# Patient Record
Sex: Female | Born: 1955 | Race: Black or African American | Hispanic: No | Marital: Married | Smoking: Former smoker
Health system: Southern US, Community
[De-identification: ages and names within clinical notes are randomized; demographics above are authoritative.]

## PROBLEM LIST (undated history)

## (undated) DIAGNOSIS — I5030 Unspecified diastolic (congestive) heart failure: Secondary | ICD-10-CM

## (undated) DIAGNOSIS — K859 Acute pancreatitis without necrosis or infection, unspecified: Secondary | ICD-10-CM

## (undated) DIAGNOSIS — Z86718 Personal history of other venous thrombosis and embolism: Secondary | ICD-10-CM

## (undated) DIAGNOSIS — I10 Essential (primary) hypertension: Secondary | ICD-10-CM

## (undated) DIAGNOSIS — Z531 Procedure and treatment not carried out because of patient's decision for reasons of belief and group pressure: Secondary | ICD-10-CM

## (undated) DIAGNOSIS — D649 Anemia, unspecified: Secondary | ICD-10-CM

## (undated) DIAGNOSIS — E785 Hyperlipidemia, unspecified: Secondary | ICD-10-CM

## (undated) DIAGNOSIS — J189 Pneumonia, unspecified organism: Secondary | ICD-10-CM

## (undated) DIAGNOSIS — G589 Mononeuropathy, unspecified: Secondary | ICD-10-CM

## (undated) DIAGNOSIS — M797 Fibromyalgia: Secondary | ICD-10-CM

## (undated) DIAGNOSIS — G629 Polyneuropathy, unspecified: Secondary | ICD-10-CM

## (undated) DIAGNOSIS — F32A Depression, unspecified: Secondary | ICD-10-CM

## (undated) DIAGNOSIS — N289 Disorder of kidney and ureter, unspecified: Secondary | ICD-10-CM

## (undated) DIAGNOSIS — K5900 Constipation, unspecified: Secondary | ICD-10-CM

## (undated) DIAGNOSIS — I890 Lymphedema, not elsewhere classified: Secondary | ICD-10-CM

## (undated) DIAGNOSIS — M1990 Unspecified osteoarthritis, unspecified site: Secondary | ICD-10-CM

## (undated) DIAGNOSIS — IMO0001 Reserved for inherently not codable concepts without codable children: Secondary | ICD-10-CM

## (undated) DIAGNOSIS — I38 Endocarditis, valve unspecified: Secondary | ICD-10-CM

## (undated) DIAGNOSIS — T8859XA Other complications of anesthesia, initial encounter: Secondary | ICD-10-CM

## (undated) DIAGNOSIS — E119 Type 2 diabetes mellitus without complications: Secondary | ICD-10-CM

## (undated) DIAGNOSIS — F419 Anxiety disorder, unspecified: Secondary | ICD-10-CM

## (undated) DIAGNOSIS — G4730 Sleep apnea, unspecified: Secondary | ICD-10-CM

## (undated) DIAGNOSIS — I509 Heart failure, unspecified: Secondary | ICD-10-CM

## (undated) DIAGNOSIS — E559 Vitamin D deficiency, unspecified: Secondary | ICD-10-CM

## (undated) DIAGNOSIS — T4145XA Adverse effect of unspecified anesthetic, initial encounter: Secondary | ICD-10-CM

## (undated) DIAGNOSIS — F329 Major depressive disorder, single episode, unspecified: Secondary | ICD-10-CM

## (undated) DIAGNOSIS — T7840XA Allergy, unspecified, initial encounter: Secondary | ICD-10-CM

## (undated) DIAGNOSIS — N189 Chronic kidney disease, unspecified: Secondary | ICD-10-CM

## (undated) HISTORY — DX: Polyneuropathy, unspecified: G62.9

## (undated) HISTORY — DX: Mononeuropathy, unspecified: G58.9

## (undated) HISTORY — DX: Chronic kidney disease, unspecified: N18.9

## (undated) HISTORY — DX: Anemia, unspecified: D64.9

## (undated) HISTORY — DX: Acute pancreatitis without necrosis or infection, unspecified: K85.90

## (undated) HISTORY — PX: PATELLAR TENDON REPAIR: SHX737

## (undated) HISTORY — DX: Endocarditis, valve unspecified: I38

## (undated) HISTORY — DX: Allergy, unspecified, initial encounter: T78.40XA

## (undated) HISTORY — DX: Lymphedema, not elsewhere classified: I89.0

## (undated) HISTORY — DX: Depression, unspecified: F32.A

## (undated) HISTORY — DX: Hyperlipidemia, unspecified: E78.5

## (undated) HISTORY — DX: Unspecified osteoarthritis, unspecified site: M19.90

## (undated) HISTORY — PX: COLONOSCOPY: SHX174

## (undated) HISTORY — DX: Vitamin D deficiency, unspecified: E55.9

## (undated) HISTORY — DX: Constipation, unspecified: K59.00

## (undated) HISTORY — DX: Sleep apnea, unspecified: G47.30

## (undated) HISTORY — DX: Heart failure, unspecified: I50.9

## (undated) HISTORY — DX: Disorder of kidney and ureter, unspecified: N28.9

## (undated) HISTORY — PX: JOINT REPLACEMENT: SHX530

## (undated) HISTORY — DX: Unspecified diastolic (congestive) heart failure: I50.30

## (undated) HISTORY — DX: Personal history of other venous thrombosis and embolism: Z86.718

## (undated) HISTORY — DX: Major depressive disorder, single episode, unspecified: F32.9

## (undated) HISTORY — PX: OTHER SURGICAL HISTORY: SHX169

## (undated) HISTORY — PX: EYE SURGERY: SHX253

---

## 1978-11-13 HISTORY — PX: TUBAL LIGATION: SHX77

## 2002-09-04 ENCOUNTER — Emergency Department (HOSPITAL_COMMUNITY): Admission: EM | Admit: 2002-09-04 | Discharge: 2002-09-04 | Payer: Self-pay

## 2002-09-19 ENCOUNTER — Emergency Department (HOSPITAL_COMMUNITY): Admission: EM | Admit: 2002-09-19 | Discharge: 2002-09-19 | Payer: Self-pay | Admitting: Emergency Medicine

## 2002-10-31 ENCOUNTER — Encounter: Payer: Self-pay | Admitting: Family Medicine

## 2002-10-31 ENCOUNTER — Ambulatory Visit (HOSPITAL_COMMUNITY): Admission: RE | Admit: 2002-10-31 | Discharge: 2002-10-31 | Payer: Self-pay | Admitting: Family Medicine

## 2002-11-01 ENCOUNTER — Encounter: Payer: Self-pay | Admitting: Orthopedic Surgery

## 2002-11-01 ENCOUNTER — Ambulatory Visit (HOSPITAL_COMMUNITY): Admission: RE | Admit: 2002-11-01 | Discharge: 2002-11-01 | Payer: Self-pay | Admitting: Orthopedic Surgery

## 2002-11-04 ENCOUNTER — Ambulatory Visit (HOSPITAL_COMMUNITY): Admission: RE | Admit: 2002-11-04 | Discharge: 2002-11-04 | Payer: Self-pay | Admitting: Family Medicine

## 2003-05-28 ENCOUNTER — Encounter: Admission: RE | Admit: 2003-05-28 | Discharge: 2003-08-26 | Payer: Self-pay | Admitting: Family Medicine

## 2003-08-07 ENCOUNTER — Emergency Department (HOSPITAL_COMMUNITY): Admission: EM | Admit: 2003-08-07 | Discharge: 2003-08-07 | Payer: Self-pay | Admitting: Emergency Medicine

## 2004-01-27 ENCOUNTER — Ambulatory Visit (HOSPITAL_COMMUNITY): Admission: RE | Admit: 2004-01-27 | Discharge: 2004-01-27 | Payer: Self-pay | Admitting: Obstetrics and Gynecology

## 2004-01-28 ENCOUNTER — Emergency Department (HOSPITAL_COMMUNITY): Admission: AD | Admit: 2004-01-28 | Discharge: 2004-01-28 | Payer: Self-pay | Admitting: Family Medicine

## 2004-04-21 ENCOUNTER — Emergency Department (HOSPITAL_COMMUNITY): Admission: EM | Admit: 2004-04-21 | Discharge: 2004-04-21 | Payer: Self-pay | Admitting: Family Medicine

## 2004-09-12 ENCOUNTER — Emergency Department (HOSPITAL_COMMUNITY): Admission: EM | Admit: 2004-09-12 | Discharge: 2004-09-12 | Payer: Self-pay | Admitting: Emergency Medicine

## 2005-02-23 ENCOUNTER — Inpatient Hospital Stay (HOSPITAL_COMMUNITY): Admission: EM | Admit: 2005-02-23 | Discharge: 2005-02-25 | Payer: Self-pay | Admitting: Emergency Medicine

## 2005-02-24 ENCOUNTER — Encounter: Payer: Self-pay | Admitting: Cardiology

## 2005-02-24 ENCOUNTER — Ambulatory Visit: Payer: Self-pay | Admitting: Cardiology

## 2005-09-19 ENCOUNTER — Ambulatory Visit (HOSPITAL_COMMUNITY): Admission: RE | Admit: 2005-09-19 | Discharge: 2005-09-19 | Payer: Self-pay | Admitting: Internal Medicine

## 2005-10-19 ENCOUNTER — Emergency Department (HOSPITAL_COMMUNITY): Admission: EM | Admit: 2005-10-19 | Discharge: 2005-10-19 | Payer: Self-pay | Admitting: Family Medicine

## 2005-10-21 ENCOUNTER — Emergency Department (HOSPITAL_COMMUNITY): Admission: EM | Admit: 2005-10-21 | Discharge: 2005-10-21 | Payer: Self-pay | Admitting: Family Medicine

## 2005-11-06 ENCOUNTER — Emergency Department (HOSPITAL_COMMUNITY): Admission: EM | Admit: 2005-11-06 | Discharge: 2005-11-07 | Payer: Self-pay | Admitting: Emergency Medicine

## 2005-11-07 ENCOUNTER — Emergency Department (HOSPITAL_COMMUNITY): Admission: EM | Admit: 2005-11-07 | Discharge: 2005-11-07 | Payer: Self-pay | Admitting: Family Medicine

## 2005-11-15 ENCOUNTER — Emergency Department (HOSPITAL_COMMUNITY): Admission: EM | Admit: 2005-11-15 | Discharge: 2005-11-15 | Payer: Self-pay | Admitting: Family Medicine

## 2005-11-28 ENCOUNTER — Encounter: Admission: RE | Admit: 2005-11-28 | Discharge: 2005-11-28 | Payer: Self-pay | Admitting: Internal Medicine

## 2005-12-02 ENCOUNTER — Encounter: Admission: RE | Admit: 2005-12-02 | Discharge: 2005-12-02 | Payer: Self-pay | Admitting: Internal Medicine

## 2006-07-29 ENCOUNTER — Emergency Department (HOSPITAL_COMMUNITY): Admission: EM | Admit: 2006-07-29 | Discharge: 2006-07-29 | Payer: Self-pay | Admitting: Family Medicine

## 2007-04-15 ENCOUNTER — Ambulatory Visit (HOSPITAL_COMMUNITY): Admission: RE | Admit: 2007-04-15 | Discharge: 2007-04-15 | Payer: Self-pay | Admitting: Gynecology

## 2007-09-04 ENCOUNTER — Emergency Department (HOSPITAL_COMMUNITY): Admission: EM | Admit: 2007-09-04 | Discharge: 2007-09-04 | Payer: Self-pay | Admitting: Emergency Medicine

## 2007-10-03 ENCOUNTER — Ambulatory Visit (HOSPITAL_COMMUNITY): Admission: RE | Admit: 2007-10-03 | Discharge: 2007-10-03 | Payer: Self-pay | Admitting: Cardiology

## 2007-10-03 ENCOUNTER — Encounter (INDEPENDENT_AMBULATORY_CARE_PROVIDER_SITE_OTHER): Payer: Self-pay | Admitting: Cardiology

## 2007-11-15 ENCOUNTER — Other Ambulatory Visit: Admission: RE | Admit: 2007-11-15 | Discharge: 2007-11-15 | Payer: Self-pay | Admitting: Family Medicine

## 2008-01-22 ENCOUNTER — Encounter (INDEPENDENT_AMBULATORY_CARE_PROVIDER_SITE_OTHER): Payer: Self-pay | Admitting: Gastroenterology

## 2008-01-22 ENCOUNTER — Ambulatory Visit (HOSPITAL_COMMUNITY): Admission: RE | Admit: 2008-01-22 | Discharge: 2008-01-22 | Payer: Self-pay | Admitting: Gastroenterology

## 2008-03-11 ENCOUNTER — Emergency Department (HOSPITAL_COMMUNITY): Admission: EM | Admit: 2008-03-11 | Discharge: 2008-03-11 | Payer: Self-pay | Admitting: Emergency Medicine

## 2008-06-19 ENCOUNTER — Ambulatory Visit (HOSPITAL_COMMUNITY): Admission: RE | Admit: 2008-06-19 | Discharge: 2008-06-19 | Payer: Self-pay | Admitting: Family Medicine

## 2008-09-20 ENCOUNTER — Emergency Department (HOSPITAL_COMMUNITY): Admission: EM | Admit: 2008-09-20 | Discharge: 2008-09-20 | Payer: Self-pay | Admitting: Emergency Medicine

## 2009-07-02 ENCOUNTER — Ambulatory Visit (HOSPITAL_COMMUNITY): Admission: RE | Admit: 2009-07-02 | Discharge: 2009-07-02 | Payer: Self-pay | Admitting: Internal Medicine

## 2010-03-18 ENCOUNTER — Emergency Department (HOSPITAL_COMMUNITY): Admission: EM | Admit: 2010-03-18 | Discharge: 2010-03-18 | Payer: Self-pay | Admitting: Emergency Medicine

## 2010-08-16 ENCOUNTER — Ambulatory Visit (HOSPITAL_COMMUNITY): Admission: RE | Admit: 2010-08-16 | Discharge: 2010-08-16 | Payer: Self-pay | Admitting: Internal Medicine

## 2010-12-04 ENCOUNTER — Encounter: Payer: Self-pay | Admitting: Gynecology

## 2010-12-04 ENCOUNTER — Encounter: Payer: Self-pay | Admitting: Family Medicine

## 2011-03-28 NOTE — Op Note (Signed)
Cynthia [name], Cynthia [name]                  ACCOUNT NO.:  [id number]   MEDICAL RECORD NO.:  [id number]          PATIENT TYPE:  AMB   LOCATION:  ENDO                         FACILITY:  [facility]   PHYSICIAN:  Shirley Friar, MDDATE OF BIRTH:  1955-12-25   DATE OF PROCEDURE:  01/22/2008  DATE OF DISCHARGE:                               OPERATIVE REPORT   PROCEDURE:  Colonoscopy.   INDICATIONS:  Screening.   MEDICATIONS:  Fentanyl 100 mcg IV, Versed 7 mg IV, Phenergan 12.5 mg IV.   FINDINGS:  Rectal exam was normal.  A pediatric Pentax colonoscope was  inserted into a well prepped colon and advanced to the cecum where the  ileocecal valve and appendiceal orifice were identified.  On careful  withdrawal of the colonoscope, two sessile polyps were seen in the  descending colon approximately 3-4 mm in size.  These polyps were  removed with hot biopsy.  On further withdrawal of the colonoscope, a 5-  mm sessile polyp was seen in the sigmoid colon.  This polyp was removed  with snare cautery.  In the rectum there were two diminutive polyps seen  and they were both removed with hot biopsy.  Retroflexion revealed small  internal hemorrhoids.  No other mucosal abnormalities were seen.   ASSESSMENT:  1. Colon polyps x 5, status post removal with snare cautery and hot      biopsy as stated above.  2. Internal hemorrhoids.   PLAN:  1. Follow-up on path.  2. Hold aspirin products x 2 weeks.      Shirley Friar, MD  Electronically Signed     VCS/MEDQ  D:  01/22/2008  T:  01/23/2008  Job:  [id number]   cc:   Lavonda Jumbo, M.D.  Fax: [phone]
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
## 2011-03-31 NOTE — Discharge Summary (Signed)
[name], [name]                  ACCOUNT NO.:  [id number]   MEDICAL RECORD NO.:  [id number]          PATIENT TYPE:  INP   LOCATION:  3731                         FACILITY:  [facility]   PHYSICIAN:  Wilber Bihari. Caryn Section, M.D.   DATE OF BIRTH:  [birth year]   DATE OF ADMISSION:  02/23/2005  DATE OF DISCHARGE:  02/25/2005                                 DISCHARGE SUMMARY   DISCHARGE DIAGNOSES:  1.  Hypotension, transient.  2.  Malignant hypertension.  3.  Diabetes mellitus, type 2.  4.  Anemia.  5.  Fibromyalgia.  6.  Obesity.   DISCHARGE MEDICATIONS:  1.  Lisinopril 40 mg p.o. b.i.d.  2.  Elavil 75 mg p.o. q.h.s.  3.  Norvasc 10 mg p.o. q.h.s.  4.  Tenormin 25 mg p.o. daily.  The patient is to start if blood pressure is      consistently greater than or equal to 140/90 for three measurements.  5.  Glucophage 1 g twice daily.  6.  Glucotrol 10 mg daily.  7.  Xanax.  8.  Flexeril as previously prescribed.   FOLLOWUP APPOINTMENTS:  Follow up with Dr. Kathrene Bongo in May as previously  scheduled.  Please call to make an appointment with Dr. Dorothe Pea as next  available.   SPECIAL INSTRUCTIONS:  Stop all other blood pressure medications, exercise,  low-salt diet and avoid stress.   The patient will [facility] diabetic and hypertensive nutrition teaching.   BRIEF ADMISSION HISTORY AND PHYSICAL:  Please see the dictated H&P.  Ms.  Cynthia [name] is a 55 year old African American female with difficult to control  blood pressure.  She had been on four blood pressure medications, per the  patient.  On February 23, 2005, she went to an appointment for high blood  pressure and her blood pressure was measured to have a postural drop to  80/40 on standing.  The patient felt weak and lightheaded.   HOSPITAL COURSE BY PROBLEM LIST:  #1 - HYPOTENSION:  The patient was held on  all of her blood pressure medications and within 24 hours her blood  pressures began to normalize.  First in the hospital stay, she had no  problems with hypotension and the etiology was thought to be solely from  medication.   #2 - MALIGNANT HYPERTENSION:  The patient has been battling this issue for  approximately three years.  She has been on multiple medications and states  that she is taking her medications as instructed.  It was difficult to  verify this given the fact that she would not bring in her medications to  her appointments or into the hospital.  Because of the variable blood  pressures, a workup for secondary hypertension was initiated.  The patient  had completed a 24-hour urine collection on the day of discharge, but the  result was not back and this should be checked by Dr. Kathrene Bongo at  [facility].  Additionally, she had a serum renin and serum  aldosterone checked that were both pending at the  time of discharge.  She  did have an echocardiogram that showed a 60% ejection fraction, no valvular  abnormalities or hypokinesis.  As her blood pressure gradually increased  following the holding of blood pressure medications on admission, blood  pressure medications were gradually started back.  On the day of discharge,  she was recommended to be on lisinopril 40 mg twice a day with Norvasc 10 mg  once a day.  The next blood pressure medicine Dr. Caryn Section had indicated to be  started would be Tenormin 25 mg daily, which the patient will start if she  has blood pressure measurements greater than 140/90.  She was also  instructed to evaluate her blood pressure monitor at home to make sure this  is working correctly and to use automatic blood pressure monitor as opposed  to manual given the fact that it is unclear as to whether she is doing this  correctly.  She will call [facility] if her blood pressure  begins to elevate above her goal.   #3 - DIABETES:  The patient had poor control during her hospital stay on   glipizide.  She will restart both glipizide and Glucophage as an outpatient.  Her hemoglobin A1C in the hospital was noted to be 6.7.   #4 - ANEMIA:  The hemoglobin was stable throughout the hospital stay and the  patient was asymptomatic.  Her total iron was good and she did not need iron  repletion.  She does have this anemia secondary to menorrhagia, which is not  currently occurring.      AB/MEDQ  D:  02/25/2005  T:  02/25/2005  Job:  [id number]   cc:   Cecille Aver, M.D.  [address]  [place]  [place] [address]  Fax: [phone]   Jethro Bastos, M.D.  [address]  [place]  [place] [address]  Fax: [phone]

## 2011-05-14 HISTORY — PX: NM MYOVIEW LTD: HXRAD82

## 2011-05-14 HISTORY — PX: OTHER SURGICAL HISTORY: SHX169

## 2011-05-16 ENCOUNTER — Ambulatory Visit: Payer: Medicaid Other | Attending: Anesthesiology

## 2011-05-16 DIAGNOSIS — IMO0001 Reserved for inherently not codable concepts without codable children: Secondary | ICD-10-CM | POA: Insufficient documentation

## 2011-05-16 DIAGNOSIS — M2550 Pain in unspecified joint: Secondary | ICD-10-CM | POA: Insufficient documentation

## 2011-05-16 DIAGNOSIS — M2560 Stiffness of unspecified joint, not elsewhere classified: Secondary | ICD-10-CM | POA: Insufficient documentation

## 2011-05-16 DIAGNOSIS — R5381 Other malaise: Secondary | ICD-10-CM | POA: Insufficient documentation

## 2011-06-01 ENCOUNTER — Ambulatory Visit: Payer: Medicaid Other

## 2011-06-19 ENCOUNTER — Ambulatory Visit: Payer: Medicaid Other | Attending: Anesthesiology | Admitting: Physical Therapy

## 2011-06-19 DIAGNOSIS — R5381 Other malaise: Secondary | ICD-10-CM | POA: Insufficient documentation

## 2011-06-19 DIAGNOSIS — M2550 Pain in unspecified joint: Secondary | ICD-10-CM | POA: Insufficient documentation

## 2011-06-19 DIAGNOSIS — IMO0001 Reserved for inherently not codable concepts without codable children: Secondary | ICD-10-CM | POA: Insufficient documentation

## 2011-06-19 DIAGNOSIS — M2560 Stiffness of unspecified joint, not elsewhere classified: Secondary | ICD-10-CM | POA: Insufficient documentation

## 2011-06-27 ENCOUNTER — Ambulatory Visit: Payer: Medicaid Other | Admitting: Physical Therapy

## 2011-07-10 ENCOUNTER — Inpatient Hospital Stay (HOSPITAL_COMMUNITY)
Admission: EM | Admit: 2011-07-10 | Discharge: 2011-07-16 | DRG: 440 | Disposition: A | Discharge status: Home / Self Care | Payer: Medicaid Other | Attending: Internal Medicine | Admitting: Internal Medicine

## 2011-07-10 ENCOUNTER — Inpatient Hospital Stay (INDEPENDENT_AMBULATORY_CARE_PROVIDER_SITE_OTHER)
Admission: RE | Admit: 2011-07-10 | Discharge: 2011-07-10 | Disposition: A | Discharge status: Home / Self Care | Payer: Medicaid Other | Source: Ambulatory Visit | Attending: Emergency Medicine | Admitting: Emergency Medicine

## 2011-07-10 DIAGNOSIS — E781 Pure hyperglyceridemia: Secondary | ICD-10-CM | POA: Diagnosis present

## 2011-07-10 DIAGNOSIS — E669 Obesity, unspecified: Secondary | ICD-10-CM | POA: Diagnosis present

## 2011-07-10 DIAGNOSIS — I10 Essential (primary) hypertension: Secondary | ICD-10-CM | POA: Diagnosis present

## 2011-07-10 DIAGNOSIS — K859 Acute pancreatitis without necrosis or infection, unspecified: Principal | ICD-10-CM | POA: Diagnosis present

## 2011-07-10 DIAGNOSIS — D638 Anemia in other chronic diseases classified elsewhere: Secondary | ICD-10-CM | POA: Diagnosis present

## 2011-07-10 DIAGNOSIS — E785 Hyperlipidemia, unspecified: Secondary | ICD-10-CM | POA: Diagnosis present

## 2011-07-10 DIAGNOSIS — Z79899 Other long term (current) drug therapy: Secondary | ICD-10-CM

## 2011-07-10 DIAGNOSIS — I509 Heart failure, unspecified: Secondary | ICD-10-CM | POA: Diagnosis present

## 2011-07-10 DIAGNOSIS — K819 Cholecystitis, unspecified: Secondary | ICD-10-CM

## 2011-07-10 DIAGNOSIS — E876 Hypokalemia: Secondary | ICD-10-CM | POA: Diagnosis present

## 2011-07-10 DIAGNOSIS — D509 Iron deficiency anemia, unspecified: Secondary | ICD-10-CM | POA: Diagnosis present

## 2011-07-10 DIAGNOSIS — E860 Dehydration: Secondary | ICD-10-CM | POA: Diagnosis present

## 2011-07-10 DIAGNOSIS — IMO0001 Reserved for inherently not codable concepts without codable children: Secondary | ICD-10-CM | POA: Diagnosis present

## 2011-07-10 DIAGNOSIS — M129 Arthropathy, unspecified: Secondary | ICD-10-CM | POA: Diagnosis present

## 2011-07-10 LAB — COMPREHENSIVE METABOLIC PANEL
AST: 11 U/L (ref 0–37)
Albumin: 4 g/dL (ref 3.5–5.2)
Chloride: 97 mEq/L (ref 96–112)
Creatinine, Ser: 1.19 mg/dL — ABNORMAL HIGH (ref 0.50–1.10)
Total Bilirubin: 0.3 mg/dL (ref 0.3–1.2)
Total Protein: 8.1 g/dL (ref 6.0–8.3)

## 2011-07-10 LAB — CBC
MCHC: 33.2 g/dL (ref 30.0–36.0)
MCV: 81.7 fL (ref 78.0–100.0)
Platelets: 296 10*3/uL (ref 150–400)
RDW: 12.9 % (ref 11.5–15.5)
WBC: 6.4 10*3/uL (ref 4.0–10.5)

## 2011-07-10 LAB — POCT URINALYSIS DIP (DEVICE)
Leukocytes, UA: NEGATIVE
Nitrite: NEGATIVE
Protein, ur: 30 mg/dL — AB
pH: 5.5 (ref 5.0–8.0)

## 2011-07-10 LAB — DIFFERENTIAL
Basophils Absolute: 0 10*3/uL (ref 0.0–0.1)
Eosinophils Absolute: 0.1 10*3/uL (ref 0.0–0.7)
Eosinophils Relative: 2 % (ref 0–5)

## 2011-07-11 ENCOUNTER — Emergency Department (HOSPITAL_COMMUNITY): Payer: Medicaid Other

## 2011-07-11 LAB — GLUCOSE, CAPILLARY
Glucose-Capillary: 270 mg/dL — ABNORMAL HIGH (ref 70–99)
Glucose-Capillary: 245 mg/dL — ABNORMAL HIGH (ref 70–99)
Glucose-Capillary: 140 mg/dL — ABNORMAL HIGH (ref 70–99)

## 2011-07-11 LAB — CK TOTAL AND CKMB (NOT AT ARMC)
CK, MB: 1.7 ng/mL (ref 0.3–4.0)
CK, MB: 2.2 ng/mL (ref 0.3–4.0)
Relative Index: INVALID (ref 0.0–2.5)
Total CK: 79 U/L (ref 7–177)
Total CK: 113 U/L (ref 7–177)

## 2011-07-11 LAB — HEMOGLOBIN A1C
Hgb A1c MFr Bld: 10.8 % — ABNORMAL HIGH (ref <5.7)
Mean Plasma Glucose: 263 mg/dL — ABNORMAL HIGH (ref <117)

## 2011-07-11 LAB — LIPID PANEL: HDL: 43 mg/dL (ref >39)

## 2011-07-11 LAB — TROPONIN I
Troponin I: <0.3 ng/mL (ref <0.30)
Troponin I: <0.3 ng/mL (ref <0.30)
Troponin I: <0.3 ng/mL (ref <0.30)

## 2011-07-11 MED ORDER — IOHEXOL 300 MG/ML  SOLN
100.0000 mL | Freq: Once | INTRAMUSCULAR | Status: AC | PRN
  Administered 2011-07-11: 100 mL via INTRAVENOUS
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
## 2011-07-12 ENCOUNTER — Inpatient Hospital Stay (HOSPITAL_COMMUNITY): Payer: Medicaid Other

## 2011-07-12 LAB — COMPREHENSIVE METABOLIC PANEL
Albumin: 3.1 g/dL — ABNORMAL LOW (ref 3.5–5.2)
BUN: 10 mg/dL (ref 6–23)
Calcium: 8.8 mg/dL (ref 8.4–10.5)
Creatinine, Ser: 0.92 mg/dL (ref 0.50–1.10)
GFR calc Af Amer: >60 mL/min (ref >60)
Glucose, Bld: 180 mg/dL — ABNORMAL HIGH (ref 70–99)
Potassium: 3.7 mEq/L (ref 3.5–5.1)
Total Protein: 6.6 g/dL (ref 6.0–8.3)

## 2011-07-12 LAB — DIFFERENTIAL
Basophils Absolute: 0 10*3/uL (ref 0.0–0.1)
Eosinophils Relative: 2 % (ref 0–5)
Lymphocytes Relative: 31 % (ref 12–46)
Monocytes Absolute: 0.3 10*3/uL (ref 0.1–1.0)
Monocytes Relative: 7 % (ref 3–12)

## 2011-07-12 LAB — CBC
HCT: 31.9 % — ABNORMAL LOW (ref 36.0–46.0)
Hemoglobin: 10.4 g/dL — ABNORMAL LOW (ref 12.0–15.0)
MCH: 26.7 pg (ref 26.0–34.0)
MCHC: 32.6 g/dL (ref 30.0–36.0)
RDW: 12.9 % (ref 11.5–15.5)

## 2011-07-12 LAB — GLUCOSE, CAPILLARY
Glucose-Capillary: 249 mg/dL — ABNORMAL HIGH (ref 70–99)
Glucose-Capillary: 148 mg/dL — ABNORMAL HIGH (ref 70–99)

## 2011-07-12 LAB — MAGNESIUM: Magnesium: 1.4 mg/dL — ABNORMAL LOW (ref 1.5–2.5)

## 2011-07-13 LAB — BASIC METABOLIC PANEL
CO2: 28 mEq/L (ref 19–32)
Calcium: 8.4 mg/dL (ref 8.4–10.5)
Chloride: 102 mEq/L (ref 96–112)
Creatinine, Ser: 0.84 mg/dL (ref 0.50–1.10)
GFR calc Af Amer: >60 mL/min (ref >60)
Sodium: 136 mEq/L (ref 135–145)

## 2011-07-13 LAB — GLUCOSE, CAPILLARY
Glucose-Capillary: 125 mg/dL — ABNORMAL HIGH (ref 70–99)
Glucose-Capillary: 93 mg/dL (ref 70–99)

## 2011-07-13 LAB — LIPASE, BLOOD: Lipase: 27 U/L (ref 11–59)

## 2011-07-13 LAB — CBC
HCT: 29.5 % — ABNORMAL LOW (ref 36.0–46.0)
Hemoglobin: 9.8 g/dL — ABNORMAL LOW (ref 12.0–15.0)
MCHC: 33.2 g/dL (ref 30.0–36.0)
WBC: 3.6 10*3/uL — ABNORMAL LOW (ref 4.0–10.5)

## 2011-07-13 LAB — MAGNESIUM: Magnesium: 1.9 mg/dL (ref 1.5–2.5)

## 2011-07-14 LAB — CBC
HCT: 28.2 % — ABNORMAL LOW (ref 36.0–46.0)
Hemoglobin: 9.3 g/dL — ABNORMAL LOW (ref 12.0–15.0)
MCHC: 33 g/dL (ref 30.0–36.0)
RBC: 3.48 MIL/uL — ABNORMAL LOW (ref 3.87–5.11)

## 2011-07-14 LAB — BASIC METABOLIC PANEL
BUN: 6 mg/dL (ref 6–23)
CO2: 24 mEq/L (ref 19–32)
Chloride: 101 mEq/L (ref 96–112)
GFR calc non Af Amer: >60 mL/min (ref >60)
Glucose, Bld: 94 mg/dL (ref 70–99)
Potassium: 3.6 mEq/L (ref 3.5–5.1)

## 2011-07-14 LAB — GLUCOSE, CAPILLARY
Glucose-Capillary: 88 mg/dL (ref 70–99)
Glucose-Capillary: 129 mg/dL — ABNORMAL HIGH (ref 70–99)
Glucose-Capillary: 163 mg/dL — ABNORMAL HIGH (ref 70–99)

## 2011-07-14 LAB — IRON AND TIBC
Iron: 38 ug/dL — ABNORMAL LOW (ref 42–135)
TIBC: 250 ug/dL (ref 250–470)

## 2011-07-15 ENCOUNTER — Inpatient Hospital Stay (HOSPITAL_COMMUNITY): Payer: Medicaid Other

## 2011-07-15 LAB — URINE MICROSCOPIC-ADD ON

## 2011-07-15 LAB — BASIC METABOLIC PANEL
CO2: 25 mEq/L (ref 19–32)
Chloride: 105 mEq/L (ref 96–112)
GFR calc Af Amer: >60 mL/min (ref >60)
Sodium: 139 mEq/L (ref 135–145)

## 2011-07-15 LAB — URINALYSIS, ROUTINE W REFLEX MICROSCOPIC
Glucose, UA: NEGATIVE mg/dL
Hgb urine dipstick: NEGATIVE
Ketones, ur: 15 mg/dL — AB
Protein, ur: 30 mg/dL — AB
Urobilinogen, UA: 1 mg/dL (ref 0.0–1.0)

## 2011-07-15 LAB — MAGNESIUM: Magnesium: 1.9 mg/dL (ref 1.5–2.5)

## 2011-07-15 LAB — CBC
Platelets: 271 10*3/uL (ref 150–400)
RBC: 3.59 MIL/uL — ABNORMAL LOW (ref 3.87–5.11)
RDW: 13 % (ref 11.5–15.5)
WBC: 5 10*3/uL (ref 4.0–10.5)

## 2011-07-15 LAB — GLUCOSE, CAPILLARY
Glucose-Capillary: 141 mg/dL — ABNORMAL HIGH (ref 70–99)
Glucose-Capillary: 135 mg/dL — ABNORMAL HIGH (ref 70–99)

## 2011-07-16 ENCOUNTER — Inpatient Hospital Stay (HOSPITAL_COMMUNITY): Payer: Medicaid Other

## 2011-07-16 ENCOUNTER — Other Ambulatory Visit (HOSPITAL_COMMUNITY): Payer: Medicaid Other

## 2011-07-16 LAB — DIFFERENTIAL
Basophils Absolute: 0 10*3/uL (ref 0.0–0.1)
Basophils Relative: 1 % (ref 0–1)
Eosinophils Absolute: 0.1 10*3/uL (ref 0.0–0.7)
Neutrophils Relative %: 49 % (ref 43–77)

## 2011-07-16 LAB — COMPREHENSIVE METABOLIC PANEL
ALT: 37 U/L — ABNORMAL HIGH (ref 0–35)
AST: 30 U/L (ref 0–37)
CO2: 27 mEq/L (ref 19–32)
Calcium: 8.9 mg/dL (ref 8.4–10.5)
Chloride: 105 mEq/L (ref 96–112)
GFR calc non Af Amer: 56 mL/min — ABNORMAL LOW (ref >60)
Potassium: 3.5 mEq/L (ref 3.5–5.1)
Sodium: 140 mEq/L (ref 135–145)

## 2011-07-16 LAB — CBC
MCH: 27.3 pg (ref 26.0–34.0)
Platelets: 243 10*3/uL (ref 150–400)
RBC: 3.37 MIL/uL — ABNORMAL LOW (ref 3.87–5.11)
WBC: 3.4 10*3/uL — ABNORMAL LOW (ref 4.0–10.5)

## 2011-07-16 LAB — GLUCOSE, CAPILLARY

## 2011-07-17 LAB — URINE CULTURE
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
## 2011-08-23 LAB — COMPREHENSIVE METABOLIC PANEL
CO2: 31
Calcium: 9.5
Creatinine, Ser: 1.12
GFR calc non Af Amer: 51 — ABNORMAL LOW
Glucose, Bld: 194 — ABNORMAL HIGH

## 2011-08-23 LAB — CBC
HCT: 33.9 — ABNORMAL LOW
Hemoglobin: 11.4 — ABNORMAL LOW
MCHC: 33.7
MCV: 81.4
RBC: 4.17

## 2011-08-23 LAB — URINALYSIS, ROUTINE W REFLEX MICROSCOPIC
Bilirubin Urine: NEGATIVE
Hgb urine dipstick: NEGATIVE
Specific Gravity, Urine: 1.014
Urobilinogen, UA: 0.2
pH: 7

## 2011-08-23 LAB — DIFFERENTIAL
Basophils Absolute: 0
Eosinophils Absolute: 0.2
Lymphocytes Relative: 34
Lymphs Abs: 1.5
Neutrophils Relative %: 55

## 2011-08-23 LAB — POCT CARDIAC MARKERS
Myoglobin, poc: 84.3
Operator id: 4708
Troponin i, poc: <0.05

## 2011-08-31 ENCOUNTER — Ambulatory Visit: Payer: Medicaid Other | Admitting: *Deleted

## 2011-09-04 ENCOUNTER — Ambulatory Visit: Payer: Medicaid Other | Admitting: *Deleted

## 2011-09-05 ENCOUNTER — Ambulatory Visit: Payer: Medicaid Other | Admitting: *Deleted

## 2011-10-11 ENCOUNTER — Other Ambulatory Visit (HOSPITAL_COMMUNITY): Payer: Self-pay | Admitting: Internal Medicine

## 2011-10-11 DIAGNOSIS — Z1231 Encounter for screening mammogram for malignant neoplasm of breast: Secondary | ICD-10-CM

## 2011-10-13 ENCOUNTER — Ambulatory Visit (HOSPITAL_COMMUNITY)
Admission: RE | Admit: 2011-10-13 | Discharge: 2011-10-13 | Disposition: A | Discharge status: Home / Self Care | Payer: Medicaid Other | Source: Ambulatory Visit | Attending: Internal Medicine | Admitting: Internal Medicine

## 2011-10-13 DIAGNOSIS — Z1231 Encounter for screening mammogram for malignant neoplasm of breast: Secondary | ICD-10-CM | POA: Insufficient documentation

## 2013-04-21 ENCOUNTER — Other Ambulatory Visit (HOSPITAL_COMMUNITY): Payer: Self-pay | Admitting: Internal Medicine

## 2013-04-21 DIAGNOSIS — Z1231 Encounter for screening mammogram for malignant neoplasm of breast: Secondary | ICD-10-CM

## 2013-04-25 ENCOUNTER — Ambulatory Visit (HOSPITAL_COMMUNITY)
Admission: RE | Admit: 2013-04-25 | Discharge: 2013-04-25 | Disposition: A | Discharge status: Home / Self Care | Payer: 59 | Source: Ambulatory Visit | Attending: Internal Medicine | Admitting: Internal Medicine

## 2013-04-25 DIAGNOSIS — Z1231 Encounter for screening mammogram for malignant neoplasm of breast: Secondary | ICD-10-CM

## 2013-05-10 ENCOUNTER — Encounter (HOSPITAL_COMMUNITY): Payer: Self-pay | Admitting: Emergency Medicine

## 2013-05-10 ENCOUNTER — Emergency Department (HOSPITAL_COMMUNITY)
Admission: EM | Admit: 2013-05-10 | Discharge: 2013-05-10 | Disposition: A | Discharge status: Home / Self Care | Payer: 59 | Source: Home / Self Care

## 2013-05-10 DIAGNOSIS — R609 Edema, unspecified: Secondary | ICD-10-CM

## 2013-05-10 DIAGNOSIS — J309 Allergic rhinitis, unspecified: Secondary | ICD-10-CM

## 2013-05-10 DIAGNOSIS — H6980 Other specified disorders of Eustachian tube, unspecified ear: Secondary | ICD-10-CM

## 2013-05-10 DIAGNOSIS — L080 Pyoderma: Secondary | ICD-10-CM

## 2013-05-10 DIAGNOSIS — H6983 Other specified disorders of Eustachian tube, bilateral: Secondary | ICD-10-CM

## 2013-05-10 DIAGNOSIS — H6692 Otitis media, unspecified, left ear: Secondary | ICD-10-CM

## 2013-05-10 DIAGNOSIS — R600 Localized edema: Secondary | ICD-10-CM

## 2013-05-10 DIAGNOSIS — H6690 Otitis media, unspecified, unspecified ear: Secondary | ICD-10-CM

## 2013-05-10 HISTORY — DX: Essential (primary) hypertension: I10

## 2013-05-10 HISTORY — DX: Type 2 diabetes mellitus without complications: E11.9

## 2013-05-10 HISTORY — DX: Unspecified osteoarthritis, unspecified site: M19.90

## 2013-05-10 HISTORY — DX: Fibromyalgia: M79.7

## 2013-05-10 MED ORDER — FLUTICASONE PROPIONATE 50 MCG/ACT NA SUSP: 2.0000 | Freq: Every day | NASAL | Status: DC

## 2013-05-10 MED ORDER — HYDROCODONE-ACETAMINOPHEN 5-325 MG PO TABS: 1.0000 | ORAL_TABLET | ORAL | Status: DC | PRN

## 2013-05-10 MED ORDER — CEFUROXIME AXETIL 250 MG PO TABS: 500.0000 mg | ORAL_TABLET | Freq: Two times a day (BID) | ORAL | Status: DC

## 2013-05-10 MED ORDER — FEXOFENADINE HCL 180 MG PO TABS: 180.0000 mg | ORAL_TABLET | Freq: Every day | ORAL | Status: DC

## 2013-05-10 MED ORDER — MUPIROCIN CALCIUM 2 % NA OINT: TOPICAL_OINTMENT | NASAL | Status: DC

## 2013-05-10 MED ORDER — DOXYCYCLINE HYCLATE 100 MG PO CAPS: 100.0000 mg | ORAL_CAPSULE | Freq: Two times a day (BID) | ORAL | Status: DC

## 2013-05-10 MED ORDER — NEOMYCIN-POLYMYXIN-HC 3.5-10000-1 OT SUSP: 3.0000 [drp] | Freq: Four times a day (QID) | OTIC | Status: DC

## 2013-05-10 NOTE — ED Notes (Signed)
Pt c/o bilateral ear pain onset 1 week... sxs include tenderness... Denies drainage, fevers... Also c/o multiple abscess on face/chin onset 1 week... sxs include: drainage, tender... She is alert and oriented w/no signs of acute distress.

## 2013-05-10 NOTE — ED Provider Notes (Signed)
History    CSN: [id number] Arrival date & time 05/10/13  1456  First MD Initiated Contact with Patient 05/10/13 1549     Chief Complaint  Patient presents with  . Otalgia  . Abscess   (Consider location/radiation/quality/duration/timing/severity/associated sxs/prior Treatment) HPI Comments: Date-year-old female complaining of bilateral ear pain for approximately a week. She states it comes and goes but when she does have moderate to severe. It became worse last night it was one reason that prompted her visit today. Her second complaint is that of pustules that have formed on the scalp several days ago and now is forming on the face. She states that she had popped him like an acne pustule and once she did that he would tend to go away. She had several and her scalp is quite sore. She now has 3-4 the smaller papular, tender pustules to her face. She has an incidental complaint of chronic PND and sinus congestion and pain.  Past Medical History  Diagnosis Date  . Diabetes mellitus without complication   . Hypertension   . Fibromyalgia   . Arthritis    Past Surgical History  Procedure Laterality Date  . Patellar tendon repair     No family history on file. History  Substance Use Topics  . Smoking status: Never Smoker   . Smokeless tobacco: Not on file  . Alcohol Use: No   OB History   Grav Para Term Preterm Abortions TAB SAB Ect Mult Living                 Review of Systems  Constitutional: Positive for fatigue. Negative for fever, chills, activity change and appetite change.  HENT: Positive for congestion, rhinorrhea, postnasal drip and sinus pressure. Negative for sore throat, facial swelling, neck pain, neck stiffness and voice change.   Eyes: Negative.   Respiratory: Positive for cough. Negative for shortness of breath.   Cardiovascular: Negative.   Gastrointestinal: Negative.   Genitourinary: Negative.   Skin: Negative for pallor and rash.  Neurological: Negative.      Allergies  Clonidine derivatives  Home Medications   Current Outpatient Rx  Name  Route  Sig  Dispense  Refill  . AMITRIPTYLINE HCL PO   Oral   Take by mouth.         . AMLODIPINE BESYLATE PO   Oral   Take by mouth.         . Furosemide (LASIX PO)   Oral   Take by mouth.         Marland Kitchen GLIPIZIDE PO   Oral   Take by mouth.         . METFORMIN HCL PO   Oral   Take by mouth.         . METHOCARBAMOL PO   Oral   Take by mouth.         . Nebivolol HCl (BYSTOLIC PO)   Oral   Take by mouth.         . Olmesartan Medoxomil (BENICAR PO)   Oral   Take by mouth.         . Potassium Chloride (KLOR-CON PO)   Oral   Take by mouth.         . cefUROXime (CEFTIN) 250 MG tablet   Oral   Take 2 tablets (500 mg total) by mouth 2 (two) times daily. Take 1 tablet bid   34 tablet   0   . doxycycline (VIBRAMYCIN) 100 MG capsule  Oral   Take 1 capsule (100 mg total) by mouth 2 (two) times daily.   20 capsule   0   . fexofenadine (ALLEGRA) 180 MG tablet   Oral   Take 1 tablet (180 mg total) by mouth daily.   30 tablet   0   . fluticasone (FLONASE) 50 MCG/ACT nasal spray   Nasal   Place 2 sprays into the nose daily.   16 g   2   . HYDROcodone-acetaminophen (NORCO/VICODIN) 5-325 MG per tablet   Oral   Take 1 tablet by mouth every 4 (four) hours as needed for pain.   15 tablet   0   . mupirocin nasal ointment (BACTROBAN) 2 %      Apply in each nostril daily   10 g   0   . neomycin-polymyxin-hydrocortisone (CORTISPORIN) 3.5-10000-1 otic suspension   Otic   Place 3 drops in ear(s) 4 (four) times daily. X 7 days   7.5 mL   0    BP 153/98  Pulse 97  Temp(Src) 98.9 F (37.2 C) (Oral)  Resp 19  SpO2 100% Physical Exam  Nursing note and vitals reviewed. Constitutional: She is oriented to person, place, and time. She appears well-developed and well-nourished. No distress.  HENT:  Bilateral EAC tenderness. The left TM is retracted and  partially erythema. The canal is tender and red. No drainage. The right canal is also tender with minor erythema. The TM with mild retraction but no discoloration. Oropharynx with streaky erythema but no exudates or swelling. There is bilateral tenderness inferior to the ear tracking along the posterior and inferior portion of the angle of the mandible.  Eyes: Conjunctivae and EOM are normal.  Neck: Normal range of motion. Neck supple.  Cardiovascular: Normal rate and regular rhythm.   No murmur heard. Pulmonary/Chest: Effort normal and breath sounds normal. No respiratory distress. She has no rales.  Abdominal: Soft. There is no tenderness.  Musculoskeletal: She exhibits edema.  Patient has chronic bilateral lower extremity edema/puffiness. She has is throughout the year. No discoloration or pitting.  Lymphadenopathy:    She has no cervical adenopathy.  Neurological: She is alert and oriented to person, place, and time.  Skin: Skin is warm and dry.  Psychiatric: She has a normal mood and affect.    ED Course  Procedures (including critical care time) Labs Reviewed - No data to display No results found. 1. ETD (eustachian tube dysfunction), bilateral   2. Otitis media of left ear   3. Allergic rhinitis due to allergen   4. Pyoderma faciale   5. Bilateral lower extremity edema     MDM  This most likely has abdominal affect it starts out with allergic rhinitis symptoms. She has PND or daily basis associated with her cough. This in turn has produces eustachian tube dysfunction and ear pain. The left ear does have erythema but I am not certain as to whether this is an actual otitis media infection versus hyperemia due to retraction. The pustules producing a pyoderma likely due to staphylococcus. We will treat this with doxycycline 100 mg twice a day. Also given a prescription for mupirocin nasal ointment to use as directed. She is advised to take Sudafed PE 10 mg every 4 hours as needed  for nasal congestion and feeling stopped up. Allegra 180 mg daily for allergy drainage. Fluticasone nasal spray as directed For her moderate to severe pain Norco 5 mg every 4-6 hours when necessary pain Cortisporin otic 3  drops to each ear 3 times a day. I wrote prescription for Ceftin primarily for otitis media past the patient to hold off on this not get field and less her ear pain continues to bother her or gets worse.   Hayden Rasmussen, NP 05/10/13 1659  Hayden Rasmussen, NP 05/10/13 1700

## 2013-05-10 NOTE — ED Provider Notes (Signed)
Medical screening examination/treatment/procedure(s) were performed by non-physician practitioner and as supervising physician I was immediately available for consultation/collaboration.  Leslee Home, M.D.  Reuben Likes, MD 05/10/13 [phone]

## 2013-07-29 ENCOUNTER — Emergency Department (INDEPENDENT_AMBULATORY_CARE_PROVIDER_SITE_OTHER)
Admission: EM | Admit: 2013-07-29 | Discharge: 2013-07-29 | Disposition: A | Discharge status: Home / Self Care | Payer: 59 | Source: Home / Self Care | Attending: Emergency Medicine | Admitting: Emergency Medicine

## 2013-07-29 ENCOUNTER — Encounter (HOSPITAL_COMMUNITY): Payer: Self-pay | Admitting: *Deleted

## 2013-07-29 DIAGNOSIS — L0291 Cutaneous abscess, unspecified: Secondary | ICD-10-CM

## 2013-07-29 MED ORDER — MUPIROCIN 2 % EX OINT: TOPICAL_OINTMENT | CUTANEOUS | Status: DC

## 2013-07-29 MED ORDER — SULFAMETHOXAZOLE-TMP DS 800-160 MG PO TABS: 2.0000 | ORAL_TABLET | Freq: Two times a day (BID) | ORAL | Status: DC

## 2013-07-29 NOTE — ED Provider Notes (Signed)
Chief Complaint:   Chief Complaint  Patient presents with  . Abscess    History of Present Illness:    Cynthia [name] is a 57 year old female who has had an abscess on her right temple for the past 2 weeks and is draining a small amount of pus. It's tender to touch. She denies fever or chills. She has had abscesses before, but not in this area. She has a history of diabetes. No history of MRSA.  Review of Systems:  Other than noted above, the patient denies any of the following symptoms: Systemic:  No fever, chills or sweats. Skin:  No rash or itching.  PMFSH:  Past medical history, family history, social history, meds, and allergies were reviewed.  She is allergic to Lyrica and clonidine. She has high blood pressure, diabetes, and fibromyalgia. She takes amitriptyline, amlodipine, Lasix, glipizide, metformin, Robaxin, diastolic, Benicar, and Klor-Con.  Physical Exam:   Vital signs:  BP 138/93  Pulse 96  Temp(Src) 98.2 F (36.8 C) (Oral)  Resp 16  SpO2 98% Skin:  She has a firm, fluctuant abscess measuring 1 cm in the right temple.  Skin exam was otherwise normal.  No rash. Ext:  Distal pulses were full, patient has full ROM of all joints.  Procedure:  Verbal informed consent was obtained.  The patient was informed of the risks and benefits of the procedure and understands and accepts.  Identity of the patient was verified verbally and by wristband.   The abscess area described above was prepped with Betadine and alcohol and anesthetized with 4 mL of 2% Xylocaine with epinephrine.  Using a #11 scalpel blade, a singe straight incision was made into the area of fluctulence, yielding a small amount of prurulent drainage.  Routine cultures were obtained.  Blunt dissection was used to break up loculations.  A sterile pressure dressing was applied.  Assessment:  The encounter diagnosis was Abscess.  Plan:   1.  Meds:  The following meds were prescribed:   Discharge Medication List as of  07/29/2013  3:17 PM    START taking these medications   Details  mupirocin ointment (BACTROBAN) 2 % Apply to both nostrils TID for 1 month., Normal    sulfamethoxazole-trimethoprim (BACTRIM DS) 800-160 MG per tablet Take 2 tablets by mouth 2 (two) times daily., Starting 07/29/2013, Until Discontinued, Normal        2.  Patient Education/Counseling:  The patient was given appropriate handouts, self care instructions, and instructed in symptomatic relief.  Patient was instructed in a staphylococcal eradication regimen including 3 times daily application mupirocin to her nostrils and twice weekly Clorox baths for 3 months.  3.  Follow up:  The patient was instructed to leave the dressing in place and return again if becoming worse in any way, and given some red flag symptoms such as worsening pain or fever which would prompt immediate return.  Follow up here if necessary.     Reuben Likes, MD 07/29/13 [phone]

## 2013-07-29 NOTE — ED Notes (Signed)
Pt  Reports  Boil     On  The     r     Side  Of  Her  Head that  Is  Red  And  Has  A  Yellow  Core  -  She  Reports  It has  Been  Getting  Worse    Over  The         Last  sev  Weeks          she  Is  A  Diabetic             As  Well

## 2013-07-29 NOTE — ED Notes (Signed)
DRESSING  PLACED  ON  PTS  HEAD   BY  DR  Lorenz Coaster         THE  DRESSING  IS  DRY  AND  INTACT  NO  BLEEDING  NOTED

## 2013-08-01 LAB — CULTURE, ROUTINE-ABSCESS

## 2013-08-07 ENCOUNTER — Encounter: Payer: Self-pay | Admitting: Gastroenterology

## 2013-08-15 NOTE — ED Notes (Signed)
Abscess culture R scalp: Mod. Staph. Aureus.  Pt. adequately treated with I and D and Bactrim DS. Vassie Moselle 08/15/2013

## 2013-10-02 ENCOUNTER — Ambulatory Visit (AMBULATORY_SURGERY_CENTER): Payer: 59

## 2013-10-02 VITALS — Ht 67.0 in | Wt 260.0 lb

## 2013-10-02 DIAGNOSIS — Z860100 Personal history of colon polyps, unspecified: Secondary | ICD-10-CM

## 2013-10-02 MED ORDER — MOVIPREP 100 G PO SOLR: 1.0000 | Freq: Once | ORAL | Status: DC

## 2013-10-03 ENCOUNTER — Encounter: Payer: Self-pay | Admitting: Gastroenterology

## 2013-10-13 HISTORY — PX: OTHER SURGICAL HISTORY: SHX169

## 2013-10-17 ENCOUNTER — Encounter: Payer: Self-pay | Admitting: Gastroenterology

## 2013-10-17 ENCOUNTER — Telehealth: Payer: Self-pay

## 2013-10-17 ENCOUNTER — Ambulatory Visit (AMBULATORY_SURGERY_CENTER): Payer: 59 | Admitting: Gastroenterology

## 2013-10-17 VITALS — BP 135/78 | HR 76 | Temp 97.4°F | Resp 16 | Ht 67.0 in | Wt 260.0 lb

## 2013-10-17 DIAGNOSIS — Z538 Procedure and treatment not carried out for other reasons: Secondary | ICD-10-CM

## 2013-10-17 DIAGNOSIS — Z86010 Personal history of colonic polyps: Secondary | ICD-10-CM

## 2013-10-17 MED ORDER — SODIUM CHLORIDE 0.9 % IV SOLN: 500.0000 mL | INTRAVENOUS | Status: DC

## 2013-10-17 MED ORDER — FLEET ENEMA 7-19 GM/118ML RE ENEM
1.0000 | ENEMA | Freq: Once | RECTAL | Status: AC
  Administered 2013-10-17: 1 via RECTAL

## 2013-10-17 NOTE — Progress Notes (Signed)
Pt states only had bowel movement a couple of times last night and the last time it was still brown liquid with pieces of stool. Dt Christella Hartigan informed and instructions given for pt to have enema.

## 2013-10-17 NOTE — Telephone Encounter (Signed)
My office will contact you about rescheduling this colonoscopy with  "double prep."

## 2013-10-17 NOTE — Progress Notes (Signed)
Patient did not experience any of the following events: a burn prior to discharge; a fall within the facility; wrong site/side/patient/procedure/implant event; or a hospital transfer or hospital admission upon discharge from the facility. (G8907) Patient did not have preoperative order for IV antibiotic SSI prophylaxis. (G8918)  

## 2013-10-17 NOTE — Patient Instructions (Signed)
YOU HAD AN ENDOSCOPIC PROCEDURE TODAY AT THE [facility]: Refer to the procedure report that was given to you for any specific questions about what was found during the examination.  If the procedure report does not answer your questions, please call your gastroenterologist to clarify.  If you requested that your care partner not be given the details of your procedure findings, then the procedure report has been included in a sealed envelope for you to review at your convenience later.  YOU SHOULD EXPECT: Some feelings of bloating in the abdomen. Passage of more gas than usual.  Walking can help get rid of the air that was put into your GI tract during the procedure and reduce the bloating. If you had a lower endoscopy (such as a colonoscopy or flexible sigmoidoscopy) you may notice spotting of blood in your stool or on the toilet paper. If you underwent a bowel prep for your procedure, then you may not have a normal bowel movement for a few days.  DIET: Your first meal following the procedure should be a light meal and then it is ok to progress to your normal diet.  A half-sandwich or bowl of soup is an example of a good first meal.  Heavy or fried foods are harder to digest and may make you feel nauseous or bloated.  Likewise meals heavy in dairy and vegetables can cause extra gas to form and this can also increase the bloating.  Drink plenty of fluids but you should avoid alcoholic beverages for 24 hours.  ACTIVITY: Your care partner should take you home directly after the procedure.  You should plan to take it easy, moving slowly for the rest of the day.  You can resume normal activity the day after the procedure however you should NOT DRIVE or use heavy machinery for 24 hours (because of the sedation medicines used during the test).    SYMPTOMS TO REPORT IMMEDIATELY: A gastroenterologist can be reached at any hour.  During normal business hours, 8:30 AM to 5:00 PM Monday through Friday,  call [phone].  After hours and on weekends, please call the GI answering service at [phone] who will take a message and have the physician on call contact you.   Following lower endoscopy (colonoscopy or flexible sigmoidoscopy):  Excessive amounts of blood in the stool  Significant tenderness or worsening of abdominal pains  Swelling of the abdomen that is new, acute  Fever of 100F or higher    FOLLOW UP: If any biopsies were taken you will be contacted by phone or by letter within the next 1-3 weeks.  Call your gastroenterologist if you have not heard about the biopsies in 3 weeks.  Our staff will call the home number listed on your records the next business day following your procedure to check on you and address any questions or concerns that you may have at that time regarding the information given to you following your procedure. This is a courtesy call and so if there is no answer at the home number and we have not heard from you through the emergency physician on call, we will assume that you have returned to your regular daily activities without incident.  SIGNATURES/CONFIDENTIALITY: You and/or your care partner have signed paperwork which will be entered into your electronic medical record.  These signatures attest to the fact that that the information above on your After Visit Summary has been reviewed and is understood.  Full responsibility of the confidentiality   of this discharge information lies with you and/or your care-partner.   Dr Larae Grooms will be contacting you to set up or reschedule a colonoscopy with a more extensive preparation

## 2013-10-17 NOTE — Op Note (Signed)
[facility] [address] [place], [address]   COLONOSCOPY PROCEDURE REPORT  PATIENT: Cynthia [name], Cynthia [name]  MR#: [id number] BIRTHDATE: [birth year] , 57  yrs. old GENDER: Female ENDOSCOPIST: Rachael Fee, MD REFERRED NW:GNFAO Concepcion Elk, M.D. PROCEDURE DATE:  10/17/2013 PROCEDURE:   Colonoscopy, diagnostic First Screening Colonoscopy - Avg.  risk and is 50 yrs.  old or older - No.  Prior Negative Screening - Now for repeat screening. N/A  History of Adenoma - Now for follow-up colonoscopy & has been > or = to 3 yrs.  Yes hx of adenoma.  Has been 3 or more years since last colonoscopy.  Polyps Removed Today? No.  Recommend repeat exam, <10 yrs? Yes.  Inadequate prep. ASA CLASS:   Class II INDICATIONS:Colonoscopy in 2009 with Dr.  Bosie Clos, several polyps removed: path report shows 2 of them were small (subcentimeter) adenomas. MEDICATIONS: Fentanyl 50 mcg IV, Versed 6 mg IV, and These medications were titrated to patient response per physician's verbal order  DESCRIPTION OF PROCEDURE:   After the risks benefits and alternatives of the procedure were thoroughly explained, informed consent was obtained.  A digital rectal exam revealed no abnormalities of the rectum.   The LB [id number]  endoscope was introduced through the anus and advanced to the descending colon. No adverse events experienced.   The quality of the prep was poor.  The instrument was then slowly withdrawn as the colon was fully examined.  COLON FINDINGS: There was a large amount of liquid and solid stool throughout the examined colon (to the mid descending colon).  This precluded adequate examination and the procedure was terminated. Retroflexion was not performed. The time to cecum=NA.  Withdrawal time=NA.  The scope was withdrawn and the procedure completed. COMPLICATIONS: There were no complications.  ENDOSCOPIC IMPRESSION: There was a large amount of liquid and solid stool throughout  the examined colon (to the mid descending colon).  This precluded adequate examination and the procedure was terminated.  RECOMMENDATIONS: My office will contact you about rescheduling this colonoscopy with "double prep."   eSigned:  Rachael Fee, MD 10/17/2013 9:43 AM

## 2013-10-17 NOTE — Progress Notes (Signed)
Pt states after enema only a little loose brown stool in toilet. Dr Christella Hartigan informed and will proceed with procedure.

## 2013-10-17 NOTE — Progress Notes (Signed)
Incomplete procedure due to poor prep

## 2013-10-20 ENCOUNTER — Telehealth: Payer: Self-pay

## 2013-10-20 ENCOUNTER — Telehealth: Payer: Self-pay | Admitting: Gastroenterology

## 2013-10-20 NOTE — Telephone Encounter (Signed)
See alternate note  

## 2013-10-20 NOTE — Telephone Encounter (Signed)
My office will contact you about rescheduling this colonoscopy with  "double prep."  

## 2013-10-20 NOTE — Telephone Encounter (Signed)
Left message on answering machine. 

## 2013-10-21 NOTE — Telephone Encounter (Signed)
Left message on machine to call back  

## 2013-10-22 ENCOUNTER — Telehealth: Payer: Self-pay

## 2013-10-22 NOTE — Telephone Encounter (Signed)
Left message on machine to call back letter mailed. 

## 2013-10-22 NOTE — Telephone Encounter (Signed)
Please call patient at [phone] she is upset that she has to repeat the colon and does not want to wait until 11/26/13 for another colon visit.  She does not want to be charged for the first colonoscopy that was done.  She insist on talking to Dr Christella Hartigan personally.  I did get her appts for previsit and colon.

## 2013-10-23 ENCOUNTER — Telehealth: Payer: Self-pay | Admitting: Gastroenterology

## 2013-10-23 NOTE — Telephone Encounter (Signed)
I talked with Cynthia [name] about this and will send to her for further review.  Not sure how we deal with this.

## 2013-10-24 NOTE — Telephone Encounter (Signed)
Left message on machine to call back  

## 2013-10-27 ENCOUNTER — Other Ambulatory Visit (HOSPITAL_COMMUNITY): Payer: Self-pay | Admitting: Internal Medicine

## 2013-10-27 DIAGNOSIS — E119 Type 2 diabetes mellitus without complications: Secondary | ICD-10-CM

## 2013-10-27 DIAGNOSIS — M25569 Pain in unspecified knee: Secondary | ICD-10-CM

## 2013-10-27 DIAGNOSIS — I739 Peripheral vascular disease, unspecified: Secondary | ICD-10-CM

## 2013-10-27 NOTE — Telephone Encounter (Signed)
Left message for pt to call back if she would like to speak with me

## 2013-10-29 ENCOUNTER — Ambulatory Visit (HOSPITAL_COMMUNITY): Payer: 59

## 2013-10-31 ENCOUNTER — Ambulatory Visit (HOSPITAL_COMMUNITY)
Admission: RE | Admit: 2013-10-31 | Discharge: 2013-10-31 | Disposition: A | Discharge status: Home / Self Care | Payer: 59 | Source: Ambulatory Visit | Attending: Internal Medicine | Admitting: Internal Medicine

## 2013-10-31 DIAGNOSIS — M79609 Pain in unspecified limb: Secondary | ICD-10-CM

## 2013-10-31 DIAGNOSIS — I739 Peripheral vascular disease, unspecified: Secondary | ICD-10-CM | POA: Insufficient documentation

## 2013-10-31 DIAGNOSIS — E119 Type 2 diabetes mellitus without complications: Secondary | ICD-10-CM | POA: Insufficient documentation

## 2013-10-31 DIAGNOSIS — M25569 Pain in unspecified knee: Secondary | ICD-10-CM

## 2013-10-31 NOTE — Progress Notes (Signed)
VASCULAR LAB PRELIMINARY  ARTERIAL  ABI completed:    RIGHT    LEFT    PRESSURE WAVEFORM  PRESSURE WAVEFORM  BRACHIAL 146 Triphasic BRACHIAL 157 Triphasic  DP 198 Triphasic DP 182 Triphasic  PT 112 Biphasic PT 193 Triphasic    RIGHT LEFT  ABI 1.26 1.23   ABIs and Doppler waveforms are within normal limits bilaterally.  Karon Cotterill, RVS 10/31/2013, 12:01 PM

## 2013-11-20 ENCOUNTER — Ambulatory Visit (AMBULATORY_SURGERY_CENTER): Payer: 59 | Admitting: *Deleted

## 2013-11-20 VITALS — Ht 67.0 in | Wt 257.0 lb

## 2013-11-20 DIAGNOSIS — Z86010 Personal history of colonic polyps: Secondary | ICD-10-CM

## 2013-11-20 MED ORDER — MOVIPREP 100 G PO SOLR: ORAL | Status: DC

## 2013-11-20 NOTE — Progress Notes (Signed)
No allergies to eggs or soy. No problems with anesthesia.  

## 2013-11-21 ENCOUNTER — Encounter: Payer: Self-pay | Admitting: Gastroenterology

## 2013-11-25 ENCOUNTER — Ambulatory Visit (INDEPENDENT_AMBULATORY_CARE_PROVIDER_SITE_OTHER): Payer: 59 | Admitting: Cardiology

## 2013-11-25 ENCOUNTER — Encounter: Payer: Self-pay | Admitting: Cardiology

## 2013-11-25 VITALS — BP 140/100 | HR 92 | Ht 67.0 in | Wt 264.4 lb

## 2013-11-25 DIAGNOSIS — I10 Essential (primary) hypertension: Secondary | ICD-10-CM

## 2013-11-25 DIAGNOSIS — R609 Edema, unspecified: Secondary | ICD-10-CM

## 2013-11-25 DIAGNOSIS — R600 Localized edema: Secondary | ICD-10-CM

## 2013-11-25 NOTE — Patient Instructions (Addendum)
As with the discussion with Dr Herbie BaltimoreHarding ,hedoes not think your swelling is caused from your heart.  You have had test in the past.   Recommend you she a nephrologist ( Dr Eliott Nineunham or Dr Delorse LimberYarobourgh)  Check your blood pressure- if it continue to be elevated- take 2 of Bystolic tablets daily -call to have dose change.  Your physician wants you to follow-up in 12 month Dr Herbie BaltimoreHarding.  You will receive a reminder letter in the mail two months in advance. If you don't receive a letter, please call our office to schedule the follow-up appointment.

## 2013-11-26 ENCOUNTER — Ambulatory Visit (AMBULATORY_SURGERY_CENTER): Payer: 59 | Admitting: Gastroenterology

## 2013-11-26 ENCOUNTER — Encounter: Payer: Self-pay | Admitting: Gastroenterology

## 2013-11-26 VITALS — BP 142/90 | HR 76 | Temp 97.8°F | Resp 19 | Ht 67.0 in | Wt 257.0 lb

## 2013-11-26 DIAGNOSIS — K648 Other hemorrhoids: Secondary | ICD-10-CM

## 2013-11-26 DIAGNOSIS — K644 Residual hemorrhoidal skin tags: Secondary | ICD-10-CM

## 2013-11-26 DIAGNOSIS — Z86010 Personal history of colonic polyps: Secondary | ICD-10-CM

## 2013-11-26 LAB — GLUCOSE, CAPILLARY
GLUCOSE-CAPILLARY: 190 mg/dL — AB (ref 70–99)
Glucose-Capillary: 179 mg/dL — ABNORMAL HIGH (ref 70–99)

## 2013-11-26 MED ORDER — SODIUM CHLORIDE 0.9 % IV SOLN: 500.0000 mL | INTRAVENOUS | Status: DC

## 2013-11-26 NOTE — Op Note (Signed)
[facility] [address] [place], [id number]   COLONOSCOPY PROCEDURE REPORT  PATIENT: Cynthia [name], Cynthia C.  MR#: [id number] BIRTHDATE: 1956/07/11 , 57  yrs. old GENDER: Female ENDOSCOPIST: Rachael Feeaniel P Laban Orourke, MD REFERRED XB:JYNWGBY:Edwin Concepcion ElkAvbuere, M.D. PROCEDURE DATE:  11/26/2013 PROCEDURE:   Colonoscopy, diagnostic First Screening Colonoscopy - Avg.  risk and is 50 yrs.  old or older - No.  Prior Negative Screening - Now for repeat screening. N/A  History of Adenoma - Now for follow-up colonoscopy & has been > or = to 3 yrs.  Yes hx of adenoma.  Has been 3 or more years since last colonoscopy.  Polyps Removed Today? No.  Recommend repeat exam, <10 yrs? No. ASA CLASS:   Class II INDICATIONS:Colonoscopy in 2009 with Dr.  Bosie ClosSchooler, several polyps removed two of them were subcentimeter adenomas. MEDICATIONS: Fentanyl 50 mcg IV, Versed 6 mg IV, and These medications were titrated to patient response per physician's verbal order  DESCRIPTION OF PROCEDURE:   After the risks benefits and alternatives of the procedure were thoroughly explained, informed consent was obtained.  A digital rectal exam revealed no abnormalities of the rectum.   The LB [id number]  endoscope was introduced through the anus and advanced to the cecum, which was identified by both the appendix and ileocecal valve. No adverse events experienced.   The quality of the prep was good.  The instrument was then slowly withdrawn as the colon was fully examined.   COLON FINDINGS: There were small, non-thrombosed internal and external hemorrhoids.  The examination was otherwise normal. Retroflexed views revealed no abnormalities. The time to cecum=3 minutes 57 seconds.  Withdrawal time=9 minutes 06 seconds.  The scope was withdrawn and the procedure completed. COMPLICATIONS: There were no complications.  ENDOSCOPIC IMPRESSION: There were small, non-thrombosed internal and external hemorrhoids. The examination  was otherwise normal.  RECOMMENDATIONS: You should continue to follow colorectal cancer screening guidelines for "routine risk" patients with a repeat colonoscopy in 10 years. There is no need for FOBT (stool) testing for at least 5 years.   eSigned:  Rachael Feeaniel P Oretha Weismann, MD 11/26/2013 9:30 AM

## 2013-11-26 NOTE — Patient Instructions (Signed)
YOU HAD AN ENDOSCOPIC PROCEDURE TODAY AT THE [facility]: Refer to the procedure report that was given to you for any specific questions about what was found during the examination.  If the procedure report does not answer your questions, please call your gastroenterologist to clarify.  If you requested that your care partner not be given the details of your procedure findings, then the procedure report has been included in a sealed envelope for you to review at your convenience later.  YOU SHOULD EXPECT: Some feelings of bloating in the abdomen. Passage of more gas than usual.  Walking can help get rid of the air that was put into your GI tract during the procedure and reduce the bloating. If you had a lower endoscopy (such as a colonoscopy or flexible sigmoidoscopy) you may notice spotting of blood in your stool or on the toilet paper. If you underwent a bowel prep for your procedure, then you may not have a normal bowel movement for a few days.  DIET: Your first meal following the procedure should be a light meal and then it is ok to progress to your normal diet.  A half-sandwich or bowl of soup is an example of a good first meal.  Heavy or fried foods are harder to digest and may make you feel nauseous or bloated.  Likewise meals heavy in dairy and vegetables can cause extra gas to form and this can also increase the bloating.  Drink plenty of fluids but you should avoid alcoholic beverages for 24 hours.  ACTIVITY: Your care partner should take you home directly after the procedure.  You should plan to take it easy, moving slowly for the rest of the day.  You can resume normal activity the day after the procedure however you should NOT DRIVE or use heavy machinery for 24 hours (because of the sedation medicines used during the test).    SYMPTOMS TO REPORT IMMEDIATELY: A gastroenterologist can be reached at any hour.  During normal business hours, 8:30 AM to 5:00 PM Monday through Friday,  call [phone].  After hours and on weekends, please call the GI answering service at [phone] who will take a message and have the physician on call contact you.   Following lower endoscopy (colonoscopy or flexible sigmoidoscopy):  Excessive amounts of blood in the stool  Significant tenderness or worsening of abdominal pains  Swelling of the abdomen that is new, acute  Fever of 100F or higher   FOLLOW UP: If any biopsies were taken you will be contacted by phone or by letter within the next 1-3 weeks.  Call your gastroenterologist if you have not heard about the biopsies in 3 weeks.  Our staff will call the home number listed on your records the next business day following your procedure to check on you and address any questions or concerns that you may have at that time regarding the information given to you following your procedure. This is a courtesy call and so if there is no answer at the home number and we have not heard from you through the emergency physician on call, we will assume that you have returned to your regular daily activities without incident.  SIGNATURES/CONFIDENTIALITY: You and/or your care partner have signed paperwork which will be entered into your electronic medical record.  These signatures attest to the fact that that the information above on your After Visit Summary has been reviewed and is understood.  Full responsibility of the confidentiality of   this discharge information lies with you and/or your care-partner.  Information on hemorrhoids given to you today 

## 2013-11-27 ENCOUNTER — Telehealth: Payer: Self-pay

## 2013-11-27 NOTE — Telephone Encounter (Signed)
  Follow up Call-  Call back number 11/26/2013 10/17/2013  Post procedure Call Back phone  # [phone] or  [phone] mess [phone]  Permission to leave phone message Yes Yes     Patient questions:  Do you have a fever, pain , or abdominal swelling? no Pain Score  0 *  Have you tolerated food without any problems? yes  Have you been able to return to your normal activities? yes  Do you have any questions about your discharge instructions: Diet   no Medications  no Follow up visit  no  Do you have questions or concerns about your Care? no  Actions: * If pain score is 4 or above: No action needed, pain <4.  No problems per the pt. Maw

## 2013-11-29 ENCOUNTER — Encounter: Payer: Self-pay | Admitting: Cardiology

## 2013-11-29 DIAGNOSIS — Z6839 Body mass index (BMI) 39.0-39.9, adult: Secondary | ICD-10-CM

## 2013-11-29 DIAGNOSIS — E669 Obesity, unspecified: Secondary | ICD-10-CM | POA: Insufficient documentation

## 2013-11-29 DIAGNOSIS — R600 Localized edema: Secondary | ICD-10-CM | POA: Insufficient documentation

## 2013-11-29 DIAGNOSIS — I129 Hypertensive chronic kidney disease with stage 1 through stage 4 chronic kidney disease, or unspecified chronic kidney disease: Secondary | ICD-10-CM | POA: Insufficient documentation

## 2013-11-29 DIAGNOSIS — Z6838 Body mass index (BMI) 38.0-38.9, adult: Secondary | ICD-10-CM | POA: Insufficient documentation

## 2013-11-29 NOTE — Assessment & Plan Note (Signed)
Him somewhat poor and because there is concern of possible worsening renal function from diuresis. She clearly would need increased Lasix if she is having more edema. I do not have any labs to go by and therefore I would prefer to defer dosing of diuretic to her nephrologist if there is concern of renal insufficiency. Overall her cardiac standpoint ever heart believing that the swelling is from a heart related issue since he has no left-sided heart failure symptoms of PND or orthopnea. She also really denies any suggestion of significant exertional dyspnea. She did not have evidence of venous insufficiency back in 2012 so the likelihood of being present now. Not really sure why she had arterial Dopplers done but those are relatively normal as well. Echocardiogram actually she had grade 1-2 diastolic dysfunction but that wouldn't lead to left-sided failure symptoms if involved.  She does have OSA, but had not had elevated pulmonary pressures on her echocardiogram. The treatment here would be CPAP plus or minus home oxygen. This again would not be primarily cardiac issue.

## 2013-11-29 NOTE — Assessment & Plan Note (Signed)
I talked her about importance of weight loss. I think that if OSA is a major player related to her edema, weight loss which would therefore potentially improve her OSA will be the best option. I talked about dietary modifications and the need for exercise. I also explained to her that obesity in itself can cause undue strain on the heart. Weight loss will definitely improve her blood pressure as well as other cardiac risk factors.

## 2013-11-29 NOTE — Assessment & Plan Note (Signed)
She does however have poorly controlled hypertension with elevated diastolic pressures. Since she does have diastolic insufficiency, albeit in without much the symptoms currently, being a little more aggressive with her blood pressure control is probably valuable. She is planning of heart rate and therefore will increase her beta blocker dose. Plan: Increase Bystolic to 20 mg daily.

## 2013-11-29 NOTE — Progress Notes (Signed)
PATIENT: Cynthia [name] MRN: [id number]  DOB: [birth year]   DOV:11/29/2013 PCP: Dorrene GermanAVBUERE,EDWIN A, MD  Clinic Note: Chief Complaint  Patient presents with  . Congestive Heart Failure    C/o bilateral edema in ankles and feet- for ~1 year and a half   HPI: Cynthia [name] is a 58 y.o.  female with a PMH below who presents today for reassessment and evaluation of bilateral portion of the edema. I last saw her in August of 2012. That time she is was also complaining of edema. She had an echocardiogram performed that was relatively normal with a some diastolic dysfunction and mildly elevated left atrial pressures but no abnormal right pressures. Lower extremity venous scans did not show any thrombophlebitis or reflux. The plan was for her to wear compression stockings but then not sure that she ever took that advice. She is now referred back by her primary physician for significant edema. She had lower extremity arterial Dopplers done in December which were relatively normal but did note some edema that made to study hard to do.  Interval History: She presents with ankle and feet swelling and was told that they were concerned about her Lasix dose was gradually gone up. She's currently taking the Lasix once a day as opposed to twice a day. Other than the Motrin hematoma she denies any PND, orthopnea. She denies any palpitations or irregular heartbeats. No symptoms to suggest OSA such as daytime sleepiness or fatigue. No chest tightness or pressure with rest or exertion. No palpitations or rapid heartbeats. No lightheadedness, dizziness, wooziness, syncope/near syncope. No melena, hematochezia or hematuria. No claudication symptoms.  Past Medical History  Diagnosis Date  . Diabetes mellitus without complication   . Hypertension   . Fibromyalgia   . Arthritis   . Sleep apnea     can not tolerate cpap  . Allergy   . (HFpEF) heart failure with preserved ejection fraction     Echocardiogram July 2012: EF 55%  with stage I diastolic dysfunction mild elevated left atrial pressures. Mild left atrial enlargement. There is mild concentric LVH. Mitral annulus calcification with mild to moderate MR.   Prior Cardiac Evaluation  Procedure Laterality Date  . Nm myoview ltd  July 2012    No ischemia or infarction. EF 53%  . Lower extremity venous doppler  July 2012    No thrombus or thrombophlebitis. No suggestion of venous reflux.  . Lower extremity arterial doppler  December 2014    Technically difficult due to edema. Unable to assess right PDA. Normal bilaterally.    Allergies  Allergen Reactions  . Clonidine Derivatives     Per pt: unknown    Current Outpatient Prescriptions  Medication Sig Dispense Refill  . AMITRIPTYLINE HCL PO Take by mouth.      . AMLODIPINE BESYLATE PO Take by mouth.      . Furosemide (LASIX PO) Take by mouth.      Marland Kitchen. GLIPIZIDE PO Take by mouth.      . METFORMIN HCL PO Take by mouth.      . METHOCARBAMOL PO Take by mouth.      . Nebivolol HCl (BYSTOLIC PO) Take by mouth.      . Olmesartan Medoxomil (BENICAR PO) Take by mouth.       No current facility-administered medications for this visit.    History   Social History Narrative   She is a married mother of 2. She is a nonsmoker and nondrinker of alcohol. She  also does not do much activity.   ROS: A comprehensive Review of Systems - Negative except Musculoskeletal aches and pains. Her depression symptoms of syncope better of late. Trying to walk more but has been having a burning and aching sensation in her legs and swelling.  PHYSICAL EXAM BP 140/100  Pulse 92  Ht 5\' 7"  (1.702 m)  Wt 264 lb 6.4 oz (119.931 kg)  BMI 41.40 kg/m2 General appearance: alert, cooperative, appears stated age, no distress and morbidly obese Neck: no adenopathy, no carotid bruit, no JVD and supple, symmetrical, trachea midline Lungs: clear to auscultation bilaterally, normal percussion bilaterally and Nonlabored, good air  movement Heart: normal apical impulse, regular rate and rhythm, S1, S2 normal, S4 present and Mild 1/6 HSM at the apex but difficult to hear because of her body habitus. Abdomen: soft, non-tender; bowel sounds normal; no masses,  no organomegaly and Obese Extremities: edema 1-2+ currently. She says this is much better and has been -- not wearing compression stockings.. No varicosities noted. and no ulcers, gangrene or trophic changes Pulses: 2+ and symmetric Neurologic: Grossly normal  [id number] today: Yes Rate:92 , Rhythm: NSR. Normal EKG;  Recent Labs: Not available  ASSESSMENT / PLAN: Bilateral lower extremity edema Him somewhat poor and because there is concern of possible worsening renal function from diuresis. She clearly would need increased Lasix if she is having more edema. I do not have any labs to go by and therefore I would prefer to defer dosing of diuretic to her nephrologist if there is concern of renal insufficiency. Overall her cardiac standpoint ever heart believing that the swelling is from a heart related issue since he has no left-sided heart failure symptoms of PND or orthopnea. She also really denies any suggestion of significant exertional dyspnea. She did not have evidence of venous insufficiency back in 2012 so the likelihood of being present now. Not really sure why she had arterial Dopplers done but those are relatively normal as well. Echocardiogram actually she had grade 1-2 diastolic dysfunction but that wouldn't lead to left-sided failure symptoms if involved.  She does have OSA, but had not had elevated pulmonary pressures on her echocardiogram. The treatment here would be CPAP plus or minus home oxygen. This again would not be primarily cardiac issue.  HTN (hypertension) She does however have poorly controlled hypertension with elevated diastolic pressures. Since she does have diastolic insufficiency, albeit in without much the symptoms currently, being a little  more aggressive with her blood pressure control is probably valuable. She is planning of heart rate and therefore will increase her beta blocker dose. Plan: Increase Bystolic to 20 mg daily.  Severe obesity (BMI >= 40) I talked her about importance of weight loss. I think that if OSA is a major player related to her edema, weight loss which would therefore potentially improve her OSA will be the best option. I talked about dietary modifications and the need for exercise. I also explained to her that obesity in itself can cause undue strain on the heart. Weight loss will definitely improve her blood pressure as well as other cardiac risk factors.    Orders Placed This Encounter  Procedures  . EKG 12-Lead   No orders of the defined types were placed in this encounter.    Followup: One year  DAVID W. Herbie Baltimore, M.D., M.S. [facility] [address] [place], [place]  [address]  [phone] Pager # [phone]

## 2014-05-21 ENCOUNTER — Other Ambulatory Visit (HOSPITAL_COMMUNITY): Payer: Self-pay | Admitting: Internal Medicine

## 2014-05-21 DIAGNOSIS — Z1231 Encounter for screening mammogram for malignant neoplasm of breast: Secondary | ICD-10-CM

## 2014-05-27 ENCOUNTER — Ambulatory Visit (HOSPITAL_COMMUNITY)
Admission: RE | Admit: 2014-05-27 | Discharge: 2014-05-27 | Disposition: A | Discharge status: Home / Self Care | Payer: 59 | Source: Ambulatory Visit | Attending: Internal Medicine | Admitting: Internal Medicine

## 2014-05-27 DIAGNOSIS — Z1231 Encounter for screening mammogram for malignant neoplasm of breast: Secondary | ICD-10-CM | POA: Insufficient documentation

## 2014-06-26 ENCOUNTER — Other Ambulatory Visit: Payer: Self-pay | Admitting: *Deleted

## 2014-06-26 DIAGNOSIS — I83893 Varicose veins of bilateral lower extremities with other complications: Secondary | ICD-10-CM

## 2014-06-26 DIAGNOSIS — R609 Edema, unspecified: Secondary | ICD-10-CM

## 2014-07-10 ENCOUNTER — Ambulatory Visit (HOSPITAL_COMMUNITY)
Admission: RE | Admit: 2014-07-10 | Discharge: 2014-07-10 | Disposition: A | Discharge status: Home / Self Care | Payer: 59 | Source: Ambulatory Visit | Attending: Internal Medicine | Admitting: Internal Medicine

## 2014-07-10 ENCOUNTER — Encounter (HOSPITAL_COMMUNITY): Payer: Self-pay

## 2014-07-10 ENCOUNTER — Other Ambulatory Visit (HOSPITAL_COMMUNITY): Payer: Self-pay | Admitting: Internal Medicine

## 2014-07-10 DIAGNOSIS — M25551 Pain in right hip: Secondary | ICD-10-CM

## 2014-07-10 DIAGNOSIS — M25559 Pain in unspecified hip: Secondary | ICD-10-CM | POA: Insufficient documentation

## 2014-08-05 ENCOUNTER — Encounter: Payer: Self-pay | Admitting: Vascular Surgery

## 2014-08-06 ENCOUNTER — Ambulatory Visit (HOSPITAL_COMMUNITY)
Admission: RE | Admit: 2014-08-06 | Discharge: 2014-08-06 | Disposition: A | Discharge status: Home / Self Care | Payer: 59 | Source: Ambulatory Visit | Attending: Vascular Surgery | Admitting: Vascular Surgery

## 2014-08-06 ENCOUNTER — Ambulatory Visit (INDEPENDENT_AMBULATORY_CARE_PROVIDER_SITE_OTHER): Payer: 59 | Admitting: Vascular Surgery

## 2014-08-06 ENCOUNTER — Encounter: Payer: Self-pay | Admitting: Vascular Surgery

## 2014-08-06 VITALS — BP 127/91 | HR 78 | Ht 67.0 in | Wt 248.3 lb

## 2014-08-06 DIAGNOSIS — I83893 Varicose veins of bilateral lower extremities with other complications: Secondary | ICD-10-CM | POA: Insufficient documentation

## 2014-08-06 DIAGNOSIS — I739 Peripheral vascular disease, unspecified: Secondary | ICD-10-CM

## 2014-08-06 DIAGNOSIS — R609 Edema, unspecified: Secondary | ICD-10-CM | POA: Diagnosis not present

## 2014-08-06 NOTE — Progress Notes (Signed)
[facility] [place] HISTORY AND PHYSICAL   History of Present Illness:  Patient is a 58 y.o. year old female who presents for evaluation of lower extremity swelling. She states that she has had lower extremity swelling for several years. In the past she was able to take a diuretic and this helped somewhat. She has not worn compression stockings in the past. She denies any cardiac or liver dysfunction. She states she has had some elevation of her creatinine in the past but overall she thinks her kidneys currently are doing okay. She sees Dr. Hyman Hopes. She denies prior history of DVT. She denies any previous trauma to her lower extremities. She did have a repair of a patellar tendon in her left leg in the past. She does not really complain of any significant varicosities in her legs. She states when the legs swell she does get relief by elevating her legs but sometimes this can take several days. She denies any prior ulcerations. She states that when the legs swell they are very uncomfortable. It usually swell after she has been on her feet all day. Other medical problems include diabetes, hypertension, diastolic cardiac dysfunction.  Past Medical History  Diagnosis Date  . Diabetes mellitus without complication   . Hypertension   . Fibromyalgia   . Arthritis   . Sleep apnea     can not tolerate cpap  . Allergy   . (HFpEF) heart failure with preserved ejection fraction     Echocardiogram July 2012: EF 55% with stage I diastolic dysfunction mild elevated left atrial pressures. Mild left atrial enlargement. There is mild concentric LVH. Mitral annulus calcification with mild to moderate MR.  . CHF (congestive heart failure)     Past Surgical History  Procedure Laterality Date  . Patellar tendon repair Left   . Tubal ligation  1980  . Nm myoview ltd  July 2012    No ischemia or infarction. EF 53%  . Lower extremity venous doppler  July 2012    No thrombus or thrombophlebitis.  No suggestion of venous reflux.  . Lower extremity arterial doppler  December 2014    Technically difficult due to edema. Unable to assess right PDA. Normal bilaterally.    Social History History  Substance Use Topics  . Smoking status: Never Smoker   . Smokeless tobacco: Never Used  . Alcohol Use: No    Family History Family History  Problem Relation Age of Onset  . Colon cancer Maternal Uncle 70  . Diabetes Father   . Heart disease Father   . Diabetes Sister     Allergies  Allergies  Allergen Reactions  . Clonidine Derivatives     Per pt: unknown     Current Outpatient Prescriptions  Medication Sig Dispense Refill  . AMLODIPINE BESYLATE PO Take 1 tablet by mouth daily.       . Furosemide (LASIX PO) Take 40 mg by mouth 2 (two) times daily as needed.       Marland Kitchen GLIPIZIDE PO Take 10 mg by mouth 2 (two) times daily.       Marland Kitchen METFORMIN HCL PO Take 500 mg by mouth 2 (two) times daily.       Marland Kitchen METHOCARBAMOL PO Take 75 mg by mouth 3 (three) times daily.       . Nebivolol HCl (BYSTOLIC PO) Take 10 mg by mouth. 2 tablets by mouth daily      . Olmesartan Medoxomil (BENICAR PO) Take 40 mg by  mouth daily.       . Vitamin D, Ergocalciferol, (DRISDOL) 50000 UNITS CAPS capsule Take 50,000 Units by mouth every 7 (seven) days.      . AMITRIPTYLINE HCL PO Take by mouth.       No current facility-administered medications for this visit.    RO ual changes, no sore throat  Neurologic: No dizziness, blackouts, seizures. No recent symptoms of stroke or mini- stroke. No recent episodes of slurred speech, or temporary blindness.  Cardiac: No recent episodes of chest pain/pressure, no shortness of breath at rest.  No shortness of breath with exertion.  Denies history of atrial fibrillation or irregular heartbeat  Vascular: No history of rest pain in feet.  No history of claudication.  No  history of non-healing ulcer, No history of DVT   Pulmonary: No home oxygen, no productive cough, no hemoptysis,  No asthma or wheezing  Musculoskeletal:  [x ] Arthritis, [ ]  Low back pain,  [ x] Joint pain  Hematologic:No history of hypercoagulable state.  No history of easy bleeding.  No history of anemia  Gastrointestinal: No hematochezia or melena,  No gastroesophageal reflux, no trouble swallowing  Urinary:  chronic Kidney disease,  on HD -  MWF or  TTHS,  Burning with urination,  Frequent urination,  Difficulty urinating;   Skin: No rashes  Psychological: No history of anxiety,  No history of depression   Physical Examination  Filed Vitals:   08/06/14 1102  BP: 127/91  Pulse: 78  Height:  (1.702 m)  Weight: 248 lb 4.8 oz (112.628 kg)  SpO2: 100%    Body mass index is 38.88 kg/(m^2).  General:  Alert and oriented, no acute distress HEENT: Normal Neck: No bruit or JVD Pulmonary: Clear to auscultation bilaterally Cardiac: Regular Rate and Rhythm without murmur Abdomen: Soft, non-tender, non-distended, no mass, obese  Skin: No rash, no ulcer, no obvious large varicosities Extremity Pulses:  2+ radial, brachial, femoral, nonpalpable dorsalis pedis, posterior tibial pulses bilaterally Musculoskeletal: No deformity bilateral right greater than left trace ankle and pretibial edema  Neurologic: Upper and lower extremity motor 5/5 and symmetric  DATA:  Patient had a venous duplex exam today. This did not show any evidence of saphenofemoral or greater saphenous vein reflux. She did have evidence of common femoral vein deep vein reflux bilaterally. I reviewed and interpreted this study.   ASSESSMENT:  Bilateral lower extremity deep vein reflux.  Mainstay of therapy for deep vein reflux as bilateral extremity compression stockings. She was given a prescription for these today. I also discussed with her weight loss which would help with her venous  hypertension symptoms. This improved venous hypertension symptoms and swelling and 75% of patients. There are no good interventions for deep vein reflux and compression therapy is the mainstay.   PLAN:  Bilateral lower extremity compression stockings. Try to lose weight. The patient will followup on as-needed basis.  Fabienne Bruns, MD [facility] Office: [phone] Pager: [phone]

## 2014-09-30 ENCOUNTER — Emergency Department (HOSPITAL_COMMUNITY)
Admission: EM | Admit: 2014-09-30 | Discharge: 2014-09-30 | Disposition: A | Discharge status: Home / Self Care | Payer: 59 | Attending: Emergency Medicine | Admitting: Emergency Medicine

## 2014-09-30 ENCOUNTER — Encounter (HOSPITAL_COMMUNITY): Payer: Self-pay | Admitting: *Deleted

## 2014-09-30 DIAGNOSIS — G4730 Sleep apnea, unspecified: Secondary | ICD-10-CM | POA: Insufficient documentation

## 2014-09-30 DIAGNOSIS — E119 Type 2 diabetes mellitus without complications: Secondary | ICD-10-CM | POA: Insufficient documentation

## 2014-09-30 DIAGNOSIS — F419 Anxiety disorder, unspecified: Secondary | ICD-10-CM | POA: Diagnosis not present

## 2014-09-30 DIAGNOSIS — I10 Essential (primary) hypertension: Secondary | ICD-10-CM | POA: Diagnosis present

## 2014-09-30 DIAGNOSIS — Z79899 Other long term (current) drug therapy: Secondary | ICD-10-CM | POA: Insufficient documentation

## 2014-09-30 DIAGNOSIS — I509 Heart failure, unspecified: Secondary | ICD-10-CM | POA: Diagnosis not present

## 2014-09-30 DIAGNOSIS — M797 Fibromyalgia: Secondary | ICD-10-CM | POA: Insufficient documentation

## 2014-09-30 MED ORDER — LORAZEPAM 1 MG PO TABS: 1.0000 mg | ORAL_TABLET | Freq: Three times a day (TID) | ORAL | Status: DC | PRN

## 2014-09-30 MED ORDER — LORAZEPAM 1 MG PO TABS
1.0000 mg | ORAL_TABLET | Freq: Once | ORAL | Status: AC
  Administered 2014-09-30: 1 mg via ORAL
  Filled 2014-09-30: qty 1

## 2014-09-30 NOTE — ED Notes (Signed)
Pt states has had high blood pressure since Monday, states went to dentist on Monday and they couldn't do dental work d/t her blood pressure being high, pt then states went to kidney doctor yesterday and they changed some of her medications, pt denies any symptoms, states "I have a little headache".

## 2014-09-30 NOTE — ED Provider Notes (Signed)
CSN: [id number]     Arrival date & time 09/30/14  0454 History   First MD Initiated Contact with Patient 09/30/14 [phone]     Chief Complaint  Patient presents with  . Hypertension     HPI Patient presents to the emergency department complaining of anxiety and a feeling as though she is going to "explode".  She states she is intermittently had a history of depression in the past but has never really had anxiety.  She states she's felt nervous lately and her husband reports this seems to be worsening over the past several weeks.  She does have an impending hip replacement coming up which the husband reports might be one of her major stressors.  She has not seen her primary care doctor about this.  She has not discussed this with a psychiatrist or therapist either.  Patient reports when she feels this way she checks her blood pressure is up and she is concerned that her blood pressure could be the issue.  She denies chest pain or shortness of breath.  No abdominal pain.  Denies nausea vomiting and diarrhea.  No fevers or chills.   Past Medical History  Diagnosis Date  . Diabetes mellitus without complication   . Hypertension   . Fibromyalgia   . Arthritis   . Sleep apnea     can not tolerate cpap  . Allergy   . (HFpEF) heart failure with preserved ejection fraction     Echocardiogram July 2012: EF 55% with stage I diastolic dysfunction mild elevated left atrial pressures. Mild left atrial enlargement. There is mild concentric LVH. Mitral annulus calcification with mild to moderate MR.  . CHF (congestive heart failure)    Past Surgical History  Procedure Laterality Date  . Patellar tendon repair Left   . Tubal ligation  1980  . Nm myoview ltd  July 2012    No ischemia or infarction. EF 53%  . Lower extremity venous doppler  July 2012    No thrombus or thrombophlebitis. No suggestion of venous reflux.  . Lower extremity arterial doppler  December 2014    Technically difficult due to  edema. Unable to assess right PDA. Normal bilaterally.   Family History  Problem Relation Age of Onset  . Colon cancer Maternal Uncle 70  . Diabetes Father   . Heart disease Father   . Diabetes Sister    History  Substance Use Topics  . Smoking status: Never Smoker   . Smokeless tobacco: Never Used  . Alcohol Use: No   OB History    No data available     Review of Systems  All other systems reviewed and are negative.     Allergies  Clonidine derivatives  Home Medications   Prior to Admission medications   Medication Sig Start Date End Date Taking? Authorizing Provider  AMITRIPTYLINE HCL PO Take by mouth.    Historical Provider, MD  AMLODIPINE BESYLATE PO Take 1 tablet by mouth daily.     Historical Provider, MD  Furosemide (LASIX PO) Take 40 mg by mouth 2 (two) times daily as needed.     Historical Provider, MD  GLIPIZIDE PO Take 10 mg by mouth 2 (two) times daily.     Historical Provider, MD  METFORMIN HCL PO Take 500 mg by mouth 2 (two) times daily.     Historical Provider, MD  METHOCARBAMOL PO Take 75 mg by mouth 3 (three) times daily.     Historical Provider, MD  Nebivolol  HCl (BYSTOLIC PO) Take 10 mg by mouth. 2 tablets by mouth daily    Historical Provider, MD  Olmesartan Medoxomil (BENICAR PO) Take 40 mg by mouth daily.     Historical Provider, MD  Vitamin D, Ergocalciferol, (DRISDOL) 50000 UNITS CAPS capsule Take 50,000 Units by mouth every 7 (seven) days.    Historical Provider, MD   BP 150/87 mmHg  Pulse 63  Temp(Src) 98.2 F (36.8 C) (Oral)  Resp 18  SpO2 99% Physical Exam  Constitutional: She is oriented to person, place, and time. She appears well-developed and well-nourished. No distress.  HENT:  Head: Normocephalic and atraumatic.  Eyes: EOM are normal.  Neck: Normal range of motion.  Cardiovascular: Normal rate, regular rhythm and normal heart sounds.   Pulmonary/Chest: Effort normal and breath sounds normal.  Abdominal: Soft. She exhibits no  distension. There is no tenderness.  Musculoskeletal: Normal range of motion.  Neurological: She is alert and oriented to person, place, and time.  Skin: Skin is warm and dry.  Psychiatric:  Anxious appearing. flat affect.  Nursing note and vitals reviewed.   ED Course  Procedures (including critical care time) Labs Review Labs Reviewed - No data to display  Imaging Review No results found.   EKG Interpretation None      MDM   Final diagnoses:  None    Suspect anxiety and depression.  Patient is scheduled to follow-up with her primary care physician later today.  Short course of Ativan.  I recommended follow-up with the psychiatrist and likely initiation of mood stabilization medications to be determined by the primary care doctor.    Lyanne CoKevin M Amil Moseman, MD 09/30/14 [phone]

## 2014-09-30 NOTE — Discharge Instructions (Signed)
Generalized Anxiety Disorder Generalized anxiety disorder (GAD) is a mental disorder. It interferes with life functions, including relationships, work, and school. GAD is different from normal anxiety, which everyone experiences at some point in their lives in response to specific life events and activities. Normal anxiety actually helps us prepare for and get through these life events and activities. Normal anxiety goes away after the event or activity is over.  GAD causes anxiety that is not necessarily related to specific events or activities. It also causes excess anxiety in proportion to specific events or activities. The anxiety associated with GAD is also difficult to control. GAD can vary from mild to severe. People with severe GAD can have intense waves of anxiety with physical symptoms (panic attacks).  SYMPTOMS The anxiety and worry associated with GAD are difficult to control. This anxiety and worry are related to many life events and activities and also occur more days than not for 6 months or longer. People with GAD also have three or more of the following symptoms (one or more in children):  Restlessness.   Fatigue.  Difficulty concentrating.   Irritability.  Muscle tension.  Difficulty sleeping or unsatisfying sleep. DIAGNOSIS GAD is diagnosed through an assessment by your health care provider. Your health care provider will ask you questions aboutyour mood,physical symptoms, and events in your life. Your health care provider may ask you about your medical history and use of alcohol or drugs, including prescription medicines. Your health care provider may also do a physical exam and blood tests. Certain medical conditions and the use of certain substances can cause symptoms similar to those associated with GAD. Your health care provider may refer you to a mental health specialist for further evaluation. TREATMENT The following therapies are usually used to treat GAD:    Medication. Antidepressant medication usually is prescribed for long-term daily control. Antianxiety medicines may be added in severe cases, especially when panic attacks occur.   Talk therapy (psychotherapy). Certain types of talk therapy can be helpful in treating GAD by providing support, education, and guidance. A form of talk therapy called cognitive behavioral therapy can teach you healthy ways to think about and react to daily life events and activities.  Stress managementtechniques. These include yoga, meditation, and exercise and can be very helpful when they are practiced regularly. A mental health specialist can help determine which treatment is best for you. Some people see improvement with one therapy. However, other people require a combination of therapies. Document Released: 02/24/2013 Document Revised: 03/16/2014 Document Reviewed: 02/24/2013 Exit[employer] Patient Information 2015 [employer]. This information is not intended to replace advice given to you by your health care provider. Make sure you discuss any questions you have with your health care provider.  

## 2014-10-05 ENCOUNTER — Other Ambulatory Visit (HOSPITAL_COMMUNITY): Payer: Self-pay | Admitting: Orthopaedic Surgery

## 2014-10-15 ENCOUNTER — Other Ambulatory Visit (HOSPITAL_COMMUNITY): Payer: Self-pay | Admitting: *Deleted

## 2014-10-15 NOTE — Patient Instructions (Addendum)
Cynthia [name]  10/15/2014   Your procedure is scheduled on: Thursday December 10th, 2015  [facility]   Entrance and follow signs to               Short Stay Center at 905 AM.  Call this number if you have problems the morning of surgery [phone]   Remember:  Do not eat food or drink liquids :After Midnight. Eat a Good Healthy Snack night prior to Surgery.              Take 1/2 of evening insulin dose of Lantus     Take these medicines the morning of surgery with A SIP OF WATER: Amlodipine;Labetalol;                              You may not have any metal on your body including hair pins and              piercings  Do not wear jewelry, make-up, lotions, powders or perfumes.             Do not wear nail polish.  Do not shave  48 hours prior to surgery.             .   Do not bring valuables to the hospital. [facility] IS NOT             RESPONSIBLE   FOR VALUABLES.  Contacts, dentures or bridgework may not be worn into surgery.  Leave suitcase in the car. After surgery it may be brought to your room.     Special Instructions: MRSA information sheet;deep breathing   _____________________________________________________________________             [facility] - Preparing [facility] Before surgery, you can play an important role.  Because skin is not sterile, your skin needs to be as free of germs as possible.  You can reduce the number of germs on your skin by washing with CHG (chlorahexidine gluconate) soap before surgery.  CHG is an antiseptic cleaner which kills germs and bonds with the skin to continue killing germs even after washing. Please DO NOT use if you have an allergy to CHG or antibacterial soaps.  If your skin becomes reddened/irritated stop using the CHG and inform your nurse when you arrive at Short Stay. Do not shave (including legs and underarms) for at least 48 hours prior to the first CHG shower.  You may shave  your face/neck. Please follow these instructions carefully:  1.  Shower with CHG Soap the night before surgery and the  morning of Surgery.  2.  If you choose to wash your hair, wash your hair first as usual with your  normal  shampoo.  3.  After you shampoo, rinse your hair and body thoroughly to remove the  shampoo.                           4.  Use CHG as you would any other liquid soap.  You can apply chg directly  to the skin and wash                       Gently with a scrungie or clean washcloth.  5.  Apply the CHG Soap  to your body ONLY FROM THE NECK DOWN.   Do not use on face/ open                           Wound or open sores. Avoid contact with eyes, ears mouth and genitals (private parts).                       Wash face,  Genitals (private parts) with your normal soap.             6.  Wash thoroughly, paying special attention to the area where your surgery  will be performed.  7.  Thoroughly rinse your body with warm water from the neck down.  8.  DO NOT shower/wash with your normal soap after using and rinsing off  the CHG Soap.                9.  Pat yourself dry with a clean towel.            10.  Wear clean pajamas.            11.  Place clean sheets on your bed the night of your first shower and do not  sleep with pets. Day of Surgery : Do not apply any lotions/deodorants the morning of surgery.  Please wear clean clothes to the hospital/surgery center.  FAILURE TO FOLLOW THESE INSTRUCTIONS MAY RESULT IN THE CANCELLATION OF YOUR SURGERY PATIENT SIGNATURE_________________________________  NURSE SIGNATURE__________________________________  ________________________________________________________________________

## 2014-10-16 ENCOUNTER — Encounter (HOSPITAL_COMMUNITY)
Admission: RE | Admit: 2014-10-16 | Discharge: 2014-10-16 | Disposition: A | Discharge status: Home / Self Care | Payer: 59 | Source: Ambulatory Visit | Attending: Orthopaedic Surgery | Admitting: Orthopaedic Surgery

## 2014-10-16 ENCOUNTER — Encounter (HOSPITAL_COMMUNITY): Payer: Self-pay

## 2014-10-16 ENCOUNTER — Ambulatory Visit (HOSPITAL_COMMUNITY)
Admission: RE | Admit: 2014-10-16 | Discharge: 2014-10-16 | Disposition: A | Discharge status: Home / Self Care | Payer: 59 | Source: Ambulatory Visit | Attending: Anesthesiology | Admitting: Anesthesiology

## 2014-10-16 DIAGNOSIS — J984 Other disorders of lung: Secondary | ICD-10-CM | POA: Insufficient documentation

## 2014-10-16 DIAGNOSIS — I509 Heart failure, unspecified: Secondary | ICD-10-CM

## 2014-10-16 DIAGNOSIS — I10 Essential (primary) hypertension: Secondary | ICD-10-CM | POA: Insufficient documentation

## 2014-10-16 DIAGNOSIS — E119 Type 2 diabetes mellitus without complications: Secondary | ICD-10-CM | POA: Insufficient documentation

## 2014-10-16 DIAGNOSIS — Z01818 Encounter for other preprocedural examination: Secondary | ICD-10-CM | POA: Insufficient documentation

## 2014-10-16 HISTORY — DX: Pneumonia, unspecified organism: J18.9

## 2014-10-16 HISTORY — DX: Anxiety disorder, unspecified: F41.9

## 2014-10-16 HISTORY — DX: Other complications of anesthesia, initial encounter: T88.59XA

## 2014-10-16 HISTORY — DX: Adverse effect of unspecified anesthetic, initial encounter: T41.45XA

## 2014-10-16 LAB — PROTIME-INR
INR: 1.07 (ref 0.00–1.49)
PROTHROMBIN TIME: 14 s (ref 11.6–15.2)

## 2014-10-16 LAB — URINALYSIS, ROUTINE W REFLEX MICROSCOPIC
Bilirubin Urine: NEGATIVE
Glucose, UA: NEGATIVE mg/dL
Hgb urine dipstick: NEGATIVE
Ketones, ur: NEGATIVE mg/dL
Nitrite: NEGATIVE
PROTEIN: NEGATIVE mg/dL
Specific Gravity, Urine: 1.012 (ref 1.005–1.030)
UROBILINOGEN UA: 0.2 mg/dL (ref 0.0–1.0)
pH: 5.5 (ref 5.0–8.0)

## 2014-10-16 LAB — URINE MICROSCOPIC-ADD ON

## 2014-10-16 LAB — BASIC METABOLIC PANEL
ANION GAP: 14 (ref 5–15)
BUN: 11 mg/dL (ref 6–23)
CALCIUM: 9.9 mg/dL (ref 8.4–10.5)
CHLORIDE: 98 meq/L (ref 96–112)
CO2: 26 mEq/L (ref 19–32)
CREATININE: 0.92 mg/dL (ref 0.50–1.10)
GFR calc non Af Amer: 67 mL/min — ABNORMAL LOW (ref >90)
GFR, EST AFRICAN AMERICAN: 77 mL/min — AB (ref >90)
Glucose, Bld: 122 mg/dL — ABNORMAL HIGH (ref 70–99)
Potassium: 3.8 mEq/L (ref 3.7–5.3)
Sodium: 138 mEq/L (ref 137–147)

## 2014-10-16 LAB — CBC
HCT: 37.7 % (ref 36.0–46.0)
Hemoglobin: 12.2 g/dL (ref 12.0–15.0)
MCH: 27.5 pg (ref 26.0–34.0)
MCHC: 32.4 g/dL (ref 30.0–36.0)
MCV: 85.1 fL (ref 78.0–100.0)
PLATELETS: 327 10*3/uL (ref 150–400)
RBC: 4.43 MIL/uL (ref 3.87–5.11)
RDW: 14.2 % (ref 11.5–15.5)
WBC: 5.4 10*3/uL (ref 4.0–10.5)

## 2014-10-16 LAB — SURGICAL PCR SCREEN
MRSA, PCR: NEGATIVE
STAPHYLOCOCCUS AUREUS: POSITIVE — AB

## 2014-10-16 LAB — NO BLOOD PRODUCTS

## 2014-10-16 LAB — APTT: aPTT: 40 seconds — ABNORMAL HIGH (ref 24–37)

## 2014-10-16 NOTE — Progress Notes (Signed)
Pt is a Jehovah's Witness Consent signed for refusal of blood products at PAT visit 10/16/2014

## 2014-10-16 NOTE — Progress Notes (Addendum)
EKG 11/25/2013 in epic  LOV Dr Darrick PennaFields Vascular MD 08/06/2014 epic LOV Dr Herbie BaltimoreHarding 11/25/2013 epic (cardiology)

## 2014-10-19 NOTE — Progress Notes (Signed)
pts screening positive for Staph test results routed to Dr Magnus IvanBlackman and [facility] pt notified

## 2014-10-21 NOTE — Anesthesia Preprocedure Evaluation (Addendum)
Anesthesia Evaluation  Patient identified by MRN, date of birth, ID band Patient awake    Reviewed: Allergy & Precautions, H&P , NPO status , Patient's Chart, lab work & pertinent test results  Airway Mallampati: II  TM Distance: >3 FB Neck ROM: Full    Dental no notable dental hx. (+) Dental Advisory Given, Chipped   Pulmonary sleep apnea , former smoker,  breath sounds clear to auscultation  Pulmonary exam normal       Cardiovascular hypertension, Pt. on medications and Pt. on home beta blockers + Peripheral Vascular Disease and +CHF Rhythm:Regular Rate:Normal     Neuro/Psych  Headaches, negative psych ROS   GI/Hepatic negative GI ROS, Neg liver ROS,   Endo/Other  diabetes, Type 2, Insulin Dependent, Oral Hypoglycemic AgentsObesity   Renal/GU negative Renal ROS  negative genitourinary   Musculoskeletal  (+) Arthritis -, Osteoarthritis,  Fibromyalgia -  Abdominal (+) + obese,   Peds negative pediatric ROS (+)  Hematology negative hematology ROS (+)   Anesthesia Other Findings   Reproductive/Obstetrics negative OB ROS                           Anesthesia Physical Anesthesia Plan  ASA: III  Anesthesia Plan: General   Post-op Pain Management:    Induction: Intravenous  Airway Management Planned: Oral ETT  Additional Equipment:   Intra-op Plan:   Post-operative Plan: Extubation in OR  Informed Consent: I have reviewed the patients History and Physical, chart, labs and discussed the procedure including the risks, benefits and alternatives for the proposed anesthesia with the patient or authorized representative who has indicated his/her understanding and acceptance.   Dental advisory given  Plan Discussed with: CRNA  Anesthesia Plan Comments:        Anesthesia Quick Evaluation

## 2014-10-22 ENCOUNTER — Encounter (HOSPITAL_COMMUNITY)
Admission: RE | Disposition: A | Discharge status: Home / Self Care | Payer: Self-pay | Source: Ambulatory Visit | Attending: Orthopaedic Surgery

## 2014-10-22 ENCOUNTER — Inpatient Hospital Stay (HOSPITAL_COMMUNITY): Payer: 59 | Admitting: Anesthesiology

## 2014-10-22 ENCOUNTER — Inpatient Hospital Stay (HOSPITAL_COMMUNITY): Payer: 59

## 2014-10-22 ENCOUNTER — Encounter (HOSPITAL_COMMUNITY): Payer: Self-pay | Admitting: *Deleted

## 2014-10-22 ENCOUNTER — Inpatient Hospital Stay (HOSPITAL_COMMUNITY)
Admission: RE | Admit: 2014-10-22 | Discharge: 2014-10-26 | DRG: 470 | Disposition: A | Discharge status: Home / Self Care | Payer: 59 | Source: Ambulatory Visit | Attending: Orthopaedic Surgery | Admitting: Orthopaedic Surgery

## 2014-10-22 DIAGNOSIS — I739 Peripheral vascular disease, unspecified: Secondary | ICD-10-CM | POA: Diagnosis present

## 2014-10-22 DIAGNOSIS — M25551 Pain in right hip: Secondary | ICD-10-CM | POA: Diagnosis present

## 2014-10-22 DIAGNOSIS — Z87891 Personal history of nicotine dependence: Secondary | ICD-10-CM

## 2014-10-22 DIAGNOSIS — Z6841 Body Mass Index (BMI) 40.0 and over, adult: Secondary | ICD-10-CM | POA: Diagnosis not present

## 2014-10-22 DIAGNOSIS — Z419 Encounter for procedure for purposes other than remedying health state, unspecified: Secondary | ICD-10-CM

## 2014-10-22 DIAGNOSIS — E119 Type 2 diabetes mellitus without complications: Secondary | ICD-10-CM | POA: Diagnosis present

## 2014-10-22 DIAGNOSIS — Z96641 Presence of right artificial hip joint: Secondary | ICD-10-CM

## 2014-10-22 DIAGNOSIS — G4730 Sleep apnea, unspecified: Secondary | ICD-10-CM | POA: Diagnosis present

## 2014-10-22 DIAGNOSIS — I10 Essential (primary) hypertension: Secondary | ICD-10-CM | POA: Diagnosis present

## 2014-10-22 DIAGNOSIS — E669 Obesity, unspecified: Secondary | ICD-10-CM | POA: Diagnosis present

## 2014-10-22 DIAGNOSIS — M1611 Unilateral primary osteoarthritis, right hip: Principal | ICD-10-CM

## 2014-10-22 HISTORY — PX: TOTAL HIP ARTHROPLASTY: SHX124

## 2014-10-22 LAB — GLUCOSE, CAPILLARY
GLUCOSE-CAPILLARY: 128 mg/dL — AB (ref 70–99)
GLUCOSE-CAPILLARY: 217 mg/dL — AB (ref 70–99)
GLUCOSE-CAPILLARY: 321 mg/dL — AB (ref 70–99)
Glucose-Capillary: 223 mg/dL — ABNORMAL HIGH (ref 70–99)
Glucose-Capillary: 219 mg/dL — ABNORMAL HIGH (ref 70–99)
Glucose-Capillary: 282 mg/dL — ABNORMAL HIGH (ref 70–99)

## 2014-10-22 LAB — PROTIME-INR
INR: 1.11 (ref 0.00–1.49)
PROTHROMBIN TIME: 14.4 s (ref 11.6–15.2)

## 2014-10-22 LAB — APTT: APTT: 39 s — AB (ref 24–37)

## 2014-10-22 SURGERY — ARTHROPLASTY, HIP, TOTAL, ANTERIOR APPROACH
Anesthesia: General | Site: Hip | Laterality: Right

## 2014-10-22 MED ORDER — PHENYLEPHRINE HCL 10 MG/ML IJ SOLN
INTRAMUSCULAR | Status: DC | PRN
  Administered 2014-10-22: 160 ug via INTRAVENOUS

## 2014-10-22 MED ORDER — ONDANSETRON HCL 4 MG PO TABS: 4.0000 mg | ORAL_TABLET | Freq: Four times a day (QID) | ORAL | Status: DC | PRN

## 2014-10-22 MED ORDER — ONDANSETRON HCL 4 MG/2ML IJ SOLN: 4.0000 mg | Freq: Once | INTRAMUSCULAR | Status: DC | PRN

## 2014-10-22 MED ORDER — FENTANYL CITRATE 0.05 MG/ML IJ SOLN
25.0000 ug | INTRAMUSCULAR | Status: DC | PRN
Start: 2014-10-22 — End: 2014-10-22
  Administered 2014-10-22 (×2): 50 ug via INTRAVENOUS

## 2014-10-22 MED ORDER — HYDROMORPHONE HCL 1 MG/ML IJ SOLN
1.0000 mg | INTRAMUSCULAR | Status: DC | PRN
  Administered 2014-10-22 – 2014-10-24 (×5): 1 mg via INTRAVENOUS
  Filled 2014-10-22 (×5): qty 1

## 2014-10-22 MED ORDER — ASPIRIN EC 325 MG PO TBEC
325.0000 mg | DELAYED_RELEASE_TABLET | Freq: Two times a day (BID) | ORAL | Status: DC
  Administered 2014-10-22 – 2014-10-26 (×8): 325 mg via ORAL
  Filled 2014-10-22 (×11): qty 1

## 2014-10-22 MED ORDER — FENTANYL CITRATE 0.05 MG/ML IJ SOLN
INTRAMUSCULAR | Status: DC | PRN
  Administered 2014-10-22: 100 ug via INTRAVENOUS
  Administered 2014-10-22 (×2): 50 ug via INTRAVENOUS

## 2014-10-22 MED ORDER — ONDANSETRON HCL 4 MG/2ML IJ SOLN
INTRAMUSCULAR | Status: AC
  Filled 2014-10-22: qty 2

## 2014-10-22 MED ORDER — POLYETHYLENE GLYCOL 3350 17 G PO PACK
17.0000 g | PACK | Freq: Every day | ORAL | Status: DC | PRN
  Administered 2014-10-25: 17 g via ORAL
  Filled 2014-10-22: qty 1

## 2014-10-22 MED ORDER — LACTATED RINGERS IV SOLN: INTRAVENOUS | Status: DC

## 2014-10-22 MED ORDER — FENTANYL CITRATE 0.05 MG/ML IJ SOLN
INTRAMUSCULAR | Status: AC
  Filled 2014-10-22: qty 5

## 2014-10-22 MED ORDER — MIDAZOLAM HCL 5 MG/5ML IJ SOLN
INTRAMUSCULAR | Status: DC | PRN
  Administered 2014-10-22: 2 mg via INTRAVENOUS

## 2014-10-22 MED ORDER — CISATRACURIUM BESYLATE 20 MG/10ML IV SOLN
INTRAVENOUS | Status: AC
  Filled 2014-10-22: qty 10

## 2014-10-22 MED ORDER — PHENYLEPHRINE 40 MCG/ML (10ML) SYRINGE FOR IV PUSH (FOR BLOOD PRESSURE SUPPORT)
PREFILLED_SYRINGE | INTRAVENOUS | Status: AC
  Filled 2014-10-22: qty 10

## 2014-10-22 MED ORDER — LABETALOL HCL 200 MG PO TABS
200.0000 mg | ORAL_TABLET | Freq: Two times a day (BID) | ORAL | Status: DC
  Administered 2014-10-22 – 2014-10-25 (×7): 200 mg via ORAL
  Filled 2014-10-22 (×9): qty 1

## 2014-10-22 MED ORDER — LORAZEPAM 1 MG PO TABS
1.0000 mg | ORAL_TABLET | Freq: Three times a day (TID) | ORAL | Status: DC | PRN
  Administered 2014-10-25 – 2014-10-26 (×2): 1 mg via ORAL
  Filled 2014-10-22 (×2): qty 1

## 2014-10-22 MED ORDER — DEXAMETHASONE SODIUM PHOSPHATE 10 MG/ML IJ SOLN
INTRAMUSCULAR | Status: DC | PRN
  Administered 2014-10-22: 10 mg via INTRAVENOUS

## 2014-10-22 MED ORDER — ACETAMINOPHEN 650 MG RE SUPP: 650.0000 mg | Freq: Four times a day (QID) | RECTAL | Status: DC | PRN

## 2014-10-22 MED ORDER — HYDROMORPHONE HCL 1 MG/ML IJ SOLN
0.2500 mg | INTRAMUSCULAR | Status: DC | PRN
  Administered 2014-10-22 (×2): 0.5 mg via INTRAVENOUS

## 2014-10-22 MED ORDER — GLIPIZIDE 10 MG PO TABS
10.0000 mg | ORAL_TABLET | Freq: Two times a day (BID) | ORAL | Status: DC
  Administered 2014-10-22 – 2014-10-26 (×8): 10 mg via ORAL
  Filled 2014-10-22 (×10): qty 1

## 2014-10-22 MED ORDER — ALUM & MAG HYDROXIDE-SIMETH 200-200-20 MG/5ML PO SUSP: 30.0000 mL | ORAL | Status: DC | PRN

## 2014-10-22 MED ORDER — SIMVASTATIN 10 MG PO TABS
10.0000 mg | ORAL_TABLET | Freq: Every day | ORAL | Status: DC
  Administered 2014-10-22 – 2014-10-25 (×4): 10 mg via ORAL
  Filled 2014-10-22 (×5): qty 1

## 2014-10-22 MED ORDER — SODIUM CHLORIDE 0.9 % IR SOLN
Status: DC | PRN
  Administered 2014-10-22: 1000 mL

## 2014-10-22 MED ORDER — 0.9 % SODIUM CHLORIDE (POUR BTL) OPTIME
TOPICAL | Status: DC | PRN
  Administered 2014-10-22: 1000 mL

## 2014-10-22 MED ORDER — INSULIN ASPART 100 UNIT/ML ~~LOC~~ SOLN
3.0000 [IU] | Freq: Once | SUBCUTANEOUS | Status: AC
  Administered 2014-10-22: 3 [IU] via SUBCUTANEOUS

## 2014-10-22 MED ORDER — VITAMIN D (ERGOCALCIFEROL) 1.25 MG (50000 UNIT) PO CAPS
50000.0000 [IU] | ORAL_CAPSULE | ORAL | Status: DC
  Administered 2014-10-23: 50000 [IU] via ORAL
  Filled 2014-10-22: qty 1

## 2014-10-22 MED ORDER — ONDANSETRON HCL 4 MG/2ML IJ SOLN
INTRAMUSCULAR | Status: DC | PRN
  Administered 2014-10-22: 4 mg via INTRAVENOUS

## 2014-10-22 MED ORDER — PHENOL 1.4 % MT LIQD: 1.0000 | OROMUCOSAL | Status: DC | PRN

## 2014-10-22 MED ORDER — FUROSEMIDE 40 MG PO TABS
40.0000 mg | ORAL_TABLET | Freq: Every morning | ORAL | Status: DC
  Administered 2014-10-22 – 2014-10-26 (×4): 40 mg via ORAL
  Filled 2014-10-22 (×5): qty 1

## 2014-10-22 MED ORDER — DOCUSATE SODIUM 100 MG PO CAPS
100.0000 mg | ORAL_CAPSULE | Freq: Two times a day (BID) | ORAL | Status: DC
  Administered 2014-10-22 – 2014-10-26 (×8): 100 mg via ORAL

## 2014-10-22 MED ORDER — PANTOPRAZOLE SODIUM 40 MG PO TBEC
40.0000 mg | DELAYED_RELEASE_TABLET | Freq: Every day | ORAL | Status: DC
  Administered 2014-10-22 – 2014-10-25 (×4): 40 mg via ORAL
  Filled 2014-10-22 (×5): qty 1

## 2014-10-22 MED ORDER — INSULIN GLARGINE 100 UNIT/ML ~~LOC~~ SOLN
10.0000 [IU] | Freq: Every day | SUBCUTANEOUS | Status: DC
  Administered 2014-10-22 – 2014-10-24 (×2): 10 [IU] via SUBCUTANEOUS
  Filled 2014-10-22 (×4): qty 0.1

## 2014-10-22 MED ORDER — ONDANSETRON HCL 4 MG/2ML IJ SOLN
4.0000 mg | Freq: Four times a day (QID) | INTRAMUSCULAR | Status: DC | PRN
Start: 2014-10-22 — End: 2014-10-26

## 2014-10-22 MED ORDER — FENTANYL CITRATE 0.05 MG/ML IJ SOLN
INTRAMUSCULAR | Status: AC
  Filled 2014-10-22: qty 2

## 2014-10-22 MED ORDER — METOCLOPRAMIDE HCL 5 MG/ML IJ SOLN
INTRAMUSCULAR | Status: DC | PRN
  Administered 2014-10-22: 10 mg via INTRAVENOUS

## 2014-10-22 MED ORDER — PROPOFOL 10 MG/ML IV BOLUS
INTRAVENOUS | Status: AC
  Filled 2014-10-22: qty 20

## 2014-10-22 MED ORDER — DEXAMETHASONE SODIUM PHOSPHATE 10 MG/ML IJ SOLN
INTRAMUSCULAR | Status: AC
  Filled 2014-10-22: qty 1

## 2014-10-22 MED ORDER — HYDROMORPHONE HCL 1 MG/ML IJ SOLN
INTRAMUSCULAR | Status: AC
  Filled 2014-10-22: qty 1

## 2014-10-22 MED ORDER — CEFAZOLIN SODIUM-DEXTROSE 2-3 GM-% IV SOLR
2.0000 g | INTRAVENOUS | Status: AC
  Administered 2014-10-22: 2 g via INTRAVENOUS

## 2014-10-22 MED ORDER — DEXTROSE 5 % IV SOLN
10.0000 mg | INTRAVENOUS | Status: DC | PRN
  Administered 2014-10-22: 50 ug/min via INTRAVENOUS

## 2014-10-22 MED ORDER — PROPOFOL 10 MG/ML IV BOLUS
INTRAVENOUS | Status: DC | PRN
  Administered 2014-10-22: 150 mg via INTRAVENOUS

## 2014-10-22 MED ORDER — MIDAZOLAM HCL 2 MG/2ML IJ SOLN
INTRAMUSCULAR | Status: AC
  Filled 2014-10-22: qty 2

## 2014-10-22 MED ORDER — CEFAZOLIN SODIUM 1-5 GM-% IV SOLN
1.0000 g | Freq: Four times a day (QID) | INTRAVENOUS | Status: AC
  Administered 2014-10-22 (×2): 1 g via INTRAVENOUS
  Filled 2014-10-22 (×2): qty 50

## 2014-10-22 MED ORDER — ACETAMINOPHEN 325 MG PO TABS
650.0000 mg | ORAL_TABLET | Freq: Four times a day (QID) | ORAL | Status: DC | PRN
  Administered 2014-10-24 – 2014-10-25 (×3): 650 mg via ORAL
  Filled 2014-10-22 (×3): qty 2

## 2014-10-22 MED ORDER — METFORMIN HCL 500 MG PO TABS
500.0000 mg | ORAL_TABLET | Freq: Two times a day (BID) | ORAL | Status: DC
  Administered 2014-10-22 – 2014-10-26 (×7): 500 mg via ORAL
  Filled 2014-10-22 (×10): qty 1

## 2014-10-22 MED ORDER — DEXTROSE 5 % IV SOLN
500.0000 mg | Freq: Four times a day (QID) | INTRAVENOUS | Status: DC | PRN
  Administered 2014-10-22: 500 mg via INTRAVENOUS
  Filled 2014-10-22 (×2): qty 5

## 2014-10-22 MED ORDER — LACTATED RINGERS IV SOLN
INTRAVENOUS | Status: DC | PRN
  Administered 2014-10-22: 11:00:00 via INTRAVENOUS

## 2014-10-22 MED ORDER — IRBESARTAN 300 MG PO TABS
300.0000 mg | ORAL_TABLET | Freq: Every day | ORAL | Status: DC
  Administered 2014-10-22 – 2014-10-25 (×4): 300 mg via ORAL
  Filled 2014-10-22 (×5): qty 1

## 2014-10-22 MED ORDER — TRANEXAMIC ACID 100 MG/ML IV SOLN
1000.0000 mg | INTRAVENOUS | Status: AC
  Administered 2014-10-22: 1000 mg via INTRAVENOUS
  Filled 2014-10-22: qty 10

## 2014-10-22 MED ORDER — PHENYLEPHRINE HCL 10 MG/ML IJ SOLN
INTRAMUSCULAR | Status: AC
  Filled 2014-10-22: qty 1

## 2014-10-22 MED ORDER — OXYCODONE HCL 5 MG PO TABS
5.0000 mg | ORAL_TABLET | ORAL | Status: DC | PRN
  Administered 2014-10-22 – 2014-10-25 (×15): 10 mg via ORAL
  Filled 2014-10-22 (×15): qty 2

## 2014-10-22 MED ORDER — CEFAZOLIN SODIUM-DEXTROSE 2-3 GM-% IV SOLR
INTRAVENOUS | Status: AC
  Filled 2014-10-22: qty 50

## 2014-10-22 MED ORDER — DIPHENHYDRAMINE HCL 12.5 MG/5ML PO ELIX
12.5000 mg | ORAL_SOLUTION | ORAL | Status: DC | PRN
  Administered 2014-10-22 – 2014-10-23 (×2): 25 mg via ORAL
  Administered 2014-10-23: 12.5 mg via ORAL
  Administered 2014-10-23 – 2014-10-24 (×3): 25 mg via ORAL
  Administered 2014-10-24: 12.5 mg via ORAL
  Administered 2014-10-24 (×2): 25 mg via ORAL
  Administered 2014-10-25 (×2): 12.5 mg via ORAL
  Administered 2014-10-25: 25 mg via ORAL
  Filled 2014-10-22 (×11): qty 10
  Filled 2014-10-22: qty 5

## 2014-10-22 MED ORDER — CISATRACURIUM BESYLATE (PF) 10 MG/5ML IV SOLN
INTRAVENOUS | Status: DC | PRN
  Administered 2014-10-22: 10 mg via INTRAVENOUS

## 2014-10-22 MED ORDER — METHOCARBAMOL 500 MG PO TABS
500.0000 mg | ORAL_TABLET | Freq: Four times a day (QID) | ORAL | Status: DC | PRN
  Administered 2014-10-23 (×2): 500 mg via ORAL
  Filled 2014-10-22 (×2): qty 1

## 2014-10-22 MED ORDER — LACTATED RINGERS IV SOLN
INTRAVENOUS | Status: DC
  Administered 2014-10-22: 1000 mL via INTRAVENOUS

## 2014-10-22 MED ORDER — SODIUM CHLORIDE 0.9 % IV SOLN
INTRAVENOUS | Status: DC
  Administered 2014-10-22 – 2014-10-23 (×2): via INTRAVENOUS

## 2014-10-22 MED ORDER — METOCLOPRAMIDE HCL 10 MG PO TABS: 5.0000 mg | ORAL_TABLET | Freq: Three times a day (TID) | ORAL | Status: DC | PRN

## 2014-10-22 MED ORDER — INSULIN ASPART 100 UNIT/ML ~~LOC~~ SOLN
SUBCUTANEOUS | Status: AC
  Filled 2014-10-22: qty 1

## 2014-10-22 MED ORDER — METOCLOPRAMIDE HCL 5 MG/ML IJ SOLN: 5.0000 mg | Freq: Three times a day (TID) | INTRAMUSCULAR | Status: DC | PRN

## 2014-10-22 MED ORDER — FERROUS SULFATE 325 (65 FE) MG PO TABS
325.0000 mg | ORAL_TABLET | Freq: Three times a day (TID) | ORAL | Status: DC
  Administered 2014-10-23 – 2014-10-26 (×8): 325 mg via ORAL
  Filled 2014-10-22 (×14): qty 1

## 2014-10-22 MED ORDER — MENTHOL 3 MG MT LOZG: 1.0000 | LOZENGE | OROMUCOSAL | Status: DC | PRN

## 2014-10-22 MED ORDER — METOCLOPRAMIDE HCL 5 MG/ML IJ SOLN
INTRAMUSCULAR | Status: AC
  Filled 2014-10-22: qty 2

## 2014-10-22 SURGICAL SUPPLY — 41 items
APL SKNCLS STERI-STRIP NONHPOA (GAUZE/BANDAGES/DRESSINGS)
BAG SPEC THK2 15X12 ZIP CLS (MISCELLANEOUS)
BAG ZIPLOCK 12X15 (MISCELLANEOUS) IMPLANT
BENZOIN TINCTURE PRP APPL 2/3 (GAUZE/BANDAGES/DRESSINGS) IMPLANT
BLADE SAW SGTL 18X1.27X75 (BLADE) ×2 IMPLANT
CAPT HIP TOTAL 2 ×1 IMPLANT
CELLS DAT CNTRL 66122 CELL SVR (MISCELLANEOUS) ×1 IMPLANT
COVER PERINEAL POST (MISCELLANEOUS) ×2 IMPLANT
DRAPE C-ARM 42X120 X-RAY (DRAPES) ×2 IMPLANT
DRAPE STERI IOBAN 125X83 (DRAPES) ×2 IMPLANT
DRAPE U-SHAPE 47X51 STRL (DRAPES) ×6 IMPLANT
DRSG AQUACEL AG ADV 3.5X10 (GAUZE/BANDAGES/DRESSINGS) ×2 IMPLANT
DURAPREP 26ML APPLICATOR (WOUND CARE) ×2 IMPLANT
ELECT BLADE TIP CTD 4 INCH (ELECTRODE) ×2 IMPLANT
ELECT REM PT RETURN 9FT ADLT (ELECTROSURGICAL) ×2
ELECTRODE REM PT RTRN 9FT ADLT (ELECTROSURGICAL) ×1 IMPLANT
FACESHIELD WRAPAROUND (MASK) ×8 IMPLANT
FACESHIELD WRAPAROUND OR TEAM (MASK) ×4 IMPLANT
GAUZE XEROFORM 1X8 LF (GAUZE/BANDAGES/DRESSINGS) ×1 IMPLANT
GLOVE BIO SURGEON STRL SZ7.5 (GLOVE) ×2 IMPLANT
GLOVE BIOGEL PI IND STRL 8 (GLOVE) ×2 IMPLANT
GLOVE BIOGEL PI INDICATOR 8 (GLOVE) ×2
GLOVE ECLIPSE 8.0 STRL XLNG CF (GLOVE) ×2 IMPLANT
GOWN STRL REUS W/TWL XL LVL3 (GOWN DISPOSABLE) ×4 IMPLANT
HANDPIECE INTERPULSE COAX TIP (DISPOSABLE) ×2
KIT BASIN OR (CUSTOM PROCEDURE TRAY) ×2 IMPLANT
PACK TOTAL JOINT (CUSTOM PROCEDURE TRAY) ×2 IMPLANT
RETRACTOR WND ALEXIS 18 MED (MISCELLANEOUS) ×1 IMPLANT
RTRCTR WOUND ALEXIS 18CM MED (MISCELLANEOUS) ×2
SET HNDPC FAN SPRY TIP SCT (DISPOSABLE) ×1 IMPLANT
STAPLER VISISTAT 35W (STAPLE) ×1 IMPLANT
STRIP CLOSURE SKIN 1/2X4 (GAUZE/BANDAGES/DRESSINGS) IMPLANT
SUT ETHIBOND NAB CT1 #1 30IN (SUTURE) ×2 IMPLANT
SUT MNCRL AB 4-0 PS2 18 (SUTURE) IMPLANT
SUT VIC AB 0 CT1 36 (SUTURE) ×2 IMPLANT
SUT VIC AB 1 CT1 36 (SUTURE) ×2 IMPLANT
SUT VIC AB 2-0 CT1 27 (SUTURE) ×4
SUT VIC AB 2-0 CT1 TAPERPNT 27 (SUTURE) ×2 IMPLANT
TOWEL OR 17X26 10 PK STRL BLUE (TOWEL DISPOSABLE) ×2 IMPLANT
TOWEL OR NON WOVEN STRL DISP B (DISPOSABLE) ×2 IMPLANT
TRAY FOLEY CATH 14FRSI W/METER (CATHETERS) ×1 IMPLANT

## 2014-10-22 NOTE — Progress Notes (Signed)
Utilization review completed.  

## 2014-10-22 NOTE — Progress Notes (Signed)
CBG 223 in PACU- Dr. Gentry RochJudd made aware - 3 units Novolog Insulin given subcutaneous in left arm.

## 2014-10-22 NOTE — Plan of Care (Signed)
Problem: Phase I Progression Outcomes Goal: CMS/Neurovascular status WDL Outcome: Completed/Met Date Met:  10/22/14 Goal: Pain controlled with appropriate interventions Outcome: Completed/Met Date Met:  10/22/14 Goal: Dangle or out of bed evening of surgery Outcome: Progressing Goal: Initial discharge plan identified Outcome: Completed/Met Date Met:  10/22/14 Goal: Hemodynamically stable Outcome: Completed/Met Date Met:  10/22/14     

## 2014-10-22 NOTE — Op Note (Signed)
NAMMonica [name]:  Bommarito, Timmia                  ACCOUNT NO.:  [id number]  MEDICAL RECORD NO.:  [id number]  LOCATION:  1602                         [facility]  PHYSICIAN:  Vanita PandaChristopher Y. 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  PROCEDURE:  Right total hip arthroplasty through direct anterior approach.  SURGEON:  Vanita PandaChristopher Y. Magnus IvanBlackman, M.D.  ASSISTANT:  Richardean CanalGilbert Clark, PA-C  ANESTHESIA:  General.  IMPLANTS:  DePuy Sector Gription acetabular component size 50, size 32+ 4 neutral polyethylene liner, size 10 Corail femoral component with standard offset, size 32+ 1 ceramic hip ball.  ANESTHESIA:  General.  ANTIBIOTICS:  2 g IV Ancef.  BLOOD LOSS:  Less than 500 mL.  COMPLICATIONS:  None.  INDICATIONS:  Ms. Cynthia [name] is a very pleasant 58 year old female with severe debilitating pain involving the right hip.  She has been diagnosed with primary hip osteoarthritis and this was confirmed with x-rays that showed complete loss of her joint space, periarticular osteophytes, and sclerotic changes in her right hip.  She has had an intra-articular injection of steroid which did not help at all.  Her pain is daily, her mobility is limited, and this had impacted her quality of life.  She does wish to proceed with a total hip arthroplasty.  I have explained the risks and benefits of the surgery including the risk of acute blood loss anemia, nerve and vessel injury, fracture, infection, dislocation, and DVT.  She understands the goals are decreased pain, improved mobility, and overall improved quality of life.  She is a TEFL teacherJehovah's Witness, and we respecting her convections and of no blood products. She  did get 1 g of tranexamic acid.  PROCEDURE DESCRIPTION:  After informed consent was obtained, appropriate right hip was marked.  She was brought to the operating room where general anesthesia was obtained while she was on her stretcher.  Foley catheter was placed and both the feet had traction boots applied to them.  Next, she was placed supine on the Hana fracture table with the perineal post in place and both legs in inline with skeletal traction devices, but no traction applied.  Her right operative hip was prepped and draped with DuraPrep and sterile drapes.  A time-out was called to identify correct patient, correct right hip.  We then made an incision superior and posterior to this anterior-superior iliac spine and carried this obliquely down the leg.  I dissected down the tensor fascia lata muscle and the tensor fascia was divided longitudinally to proceed with a direct anterior approach to the hip.  We cauterized the lateral femoral circumflex vessels and then placed Cobra retractors on the medial and lateral neck.  We opened up the hip capsule in L-type format and found a large joint effusion.  We did find the femoral head and neck devoid of cartilage with significant osteophytes using oscillating saw. We made our femoral neck cut proximal to the lesser trochanter and  completed this with an osteotome.  I placed a corkscrew guide in the femoral head and removed the femoral head in its entirety and found to be devoid of cartilage.  We then cleaned the acetabulum and debris and placed a bent Hohmann medially and a Cobra retractor laterally.  I cleaned out the remnants of acetabular labrum.  We then began reaming under direct visualization from a size 42 reamer in 2 mm increments up to a size 50.  With size 50, we placed that reamer under direct visualization and fluoroscopy so we could obtain our depth of the reaming, our inclination, and anteversion.  Once I was pleased with this,  I placed the real DePuy Sector Gription acetabular component size 50, the apex hole eliminator, and a 32+ 4 neutral polyethylene liner. Attention was then turned to the femur.  With the leg externally rotated to 100 degrees, extended and adducted, we were able to place a Mueller medially and Hohmann retractor behind the greater trochanter.  I released the lateral joint capsule and then used a box cutting osteotome to enter the femoral canal and a rongeur to lateralize.  I then began broaching from size 8 broach up to a size 10.  With the size 10, we used a calcar planer and then trialed a standard neck and a 32+ 1 hip ball. We brought the leg back over and up with traction and internal rotation reducing the pelvis.  I was pleased with the offset and instability as well as range of motion.  We then dislocated the hip and removed the trial components.  I placed the real Corail femoral component with standard offset, size 10 and the real 32+ 1 ceramic hip ball and reduced this back in the acetabulum, again it was stable.  We copiously irrigated the soft tissues with normal saline solution using pulsatile lavage.  We closed the joint capsule with interrupted #1 Ethibond suture followed by running #1 Vicryl in the tensor fascia, 0 Vicryl in deep tissue, 2-0 Vicryl in subcutaneous tissue, and staples on the skin.  An Aquacel dressing was applied.  She was taken off the Hana table, awakened, extubated, and taken to recovery in stable condition.  All final counts were correct.  There were no complications noted.  Of note, Richardean CanalGilbert Clark, PA-C, assisted during the entire case and his assistance was crucial in facilitating all aspects of this case.     Vanita Pandahristopher Y. Magnus IvanBlackman, M.D.     CYB/MEDQ  D:  10/22/2014  T:  10/22/2014  Job:  [id number]

## 2014-10-22 NOTE — Brief Op Note (Signed)
10/22/2014  12:33 PM  PATIENT:  Erlene Quanyise C Butler  58 y.o. female  PRE-OPERATIVE DIAGNOSIS:  Right hip osteoarthritis  POST-OPERATIVE DIAGNOSIS:  Right hip osteoarthritis  PROCEDURE:  Procedure(s): RIGHT TOTAL HIP ARTHROPLASTY ANTERIOR APPROACH (Right)  SURGEON:  Surgeon(s) and Role:    * Kathryne Hitchhristopher Y Blackman, MD - Primary  PHYSICIAN ASSISTANT: Rexene EdisonGil Clark, PA-C  ANESTHESIA:   general  EBL:  Total I/O In: -  Out: 1050 [Urine:500; Blood:550]  BLOOD ADMINISTERED:none  DRAINS: none   LOCAL MEDICATIONS USED:  NONE  SPECIMEN:  No Specimen  DISPOSITION OF SPECIMEN:  N/A  COUNTS:  YES  TOURNIQUET:  * No tourniquets in log *  DICTATION: .Other Dictation: Dictation Number [id number]  PLAN OF CARE: Admit to inpatient   PATIENT DISPOSITION:  PACU - hemodynamically stable.   Delay start of Pharmacological VTE agent (>24hrs) due to surgical blood loss or risk of bleeding: no

## 2014-10-22 NOTE — Evaluation (Signed)
Physical Therapy Evaluation Patient Details Name: Cynthia [name] MRN: [id number] DOB: 1956/11/12 Today's Date: 10/22/2014   History of Present Illness  R THR  Clinical Impression  Pt s/p R THR presents with decreased R LE strength/ROM and post op pain limiting functional mobility.  Pt should progress to d/c home with family assist and HHPT follow up.    Follow Up Recommendations Home health PT    Equipment Recommendations  Rolling walker with 5" wheels    Recommendations for Other Services OT consult     Precautions / Restrictions Precautions Precautions: Fall Restrictions Weight Bearing Restrictions: No Other Position/Activity Restrictions: WBAT      Mobility  Bed Mobility Overal bed mobility: +2 for physical assistance;Needs Assistance Bed Mobility: Supine to Sit     Supine to sit: +2 for physical assistance;+2 for safety/equipment;Min assist;Mod assist     General bed mobility comments: cues for sequence and use of L LE to self assist  Transfers Overall transfer level: Needs assistance Equipment used: Rolling walker (2 wheeled) Transfers: Sit to/from Stand Sit to Stand: Min assist;Mod assist;+2 physical assistance;+2 safety/equipment         General transfer comment: cues for LE management and use of UEs to self assist  Ambulation/Gait Ambulation/Gait assistance: Min assist;+2 safety/equipment;+2 physical assistance Ambulation Distance (Feet): 30 Feet Assistive device: Rolling walker (2 wheeled) Gait Pattern/deviations: Step-to pattern;Decreased step length - right;Decreased step length - left;Shuffle;Antalgic;Wide base of support;Trunk flexed Gait velocity: decr   General Gait Details: Cues for posture, sequence, and position from [employer]  Stairs            Wheelchair Mobility    Modified Rankin (Stroke Patients Only)       Balance                                             Pertinent Vitals/Pain Pain Assessment: 0-10 Pain  Score: 5  Pain Location: R hip/thigh Pain Descriptors / Indicators: Aching;Burning Pain Intervention(s): Limited activity within patient's tolerance;Monitored during session;Premedicated before session;Ice applied    Home Living Family/patient expects to be discharged to:: Private residence Living Arrangements: Spouse/significant other Available Help at Discharge: Family Type of Home: House Home Access: Stairs to enter Entrance Stairs-Rails: Right Entrance Stairs-Number of Steps: 4 Home Layout: Two level;1/2 bath on main level;Able to live on main level with bedroom/bathroom Home Equipment: Gilmer MorCane - single point      Prior Function Level of Independence: Independent;Independent with assistive device(s)               Hand Dominance   Dominant Hand: Right    Extremity/Trunk Assessment   Upper Extremity Assessment: Overall WFL for tasks assessed           Lower Extremity Assessment: RLE deficits/detail RLE Deficits / Details: 2+/5 hip strength with AAROM at hip to 70 flex and 10 abd    Cervical / Trunk Assessment: Normal  Communication   Communication: No difficulties  Cognition Arousal/Alertness: Awake/alert Behavior During Therapy: WFL for tasks assessed/performed Overall Cognitive Status: Within Functional Limits for tasks assessed                      General Comments      Exercises Total Joint Exercises Ankle Circles/Pumps: AROM;Both;15 reps;Supine Heel Slides: AAROM;Right;10 reps;Supine Hip ABduction/ADduction: AAROM;Right;5 reps;Supine      Assessment/Plan    PT  Assessment Patient needs continued PT services  PT Diagnosis Difficulty walking   PT Problem List Decreased strength;Decreased range of motion;Decreased activity tolerance;Decreased mobility;Decreased knowledge of use of DME;Pain;Obesity  PT Treatment Interventions DME instruction;Gait training;Stair training;Functional mobility training;Therapeutic activities;Therapeutic  exercise;Patient/family education   PT Goals (Current goals can be found in the Care Plan section) Acute Rehab PT Goals Patient Stated Goal: Resume previous lifestyle with decreased pain PT Goal Formulation: With patient Time For Goal Achievement: 10/29/14 Potential to Achieve Goals: Good    Frequency 7X/week   Barriers to discharge        Co-evaluation               End of Session Equipment Utilized During Treatment: Gait belt Activity Tolerance: Patient tolerated treatment well Patient left: in chair;with call bell/phone within reach Nurse Communication: Mobility status         Time: [id number] PT Time Calculation (min) (ACUTE ONLY): 35 min   Charges:   PT Evaluation $Initial PT Evaluation Tier I: 1 Procedure PT Treatments $Gait Training: 8-22 mins $Therapeutic Exercise: 8-22 mins   PT G Codes:          Recia Sons 10/22/2014, 6:03 PM

## 2014-10-22 NOTE — Plan of Care (Signed)
Problem: Consults Goal: Diagnosis- Total Joint Replacement Right anterior hip     

## 2014-10-22 NOTE — Progress Notes (Signed)
X-ray results noted 

## 2014-10-22 NOTE — Transfer of Care (Signed)
Immediate Anesthesia Transfer of Care Note  Patient: Cynthia [name]  Procedure(s) Performed: Procedure(s): RIGHT TOTAL HIP ARTHROPLASTY ANTERIOR APPROACH (Right)  Patient Location: PACU  Anesthesia Type:General  Level of Consciousness: awake, sedated and patient cooperative  Airway & Oxygen Therapy: Patient Spontanous Breathing and Patient connected to face mask oxygen  Post-op Assessment: Report given to PACU RN and Post -op Vital signs reviewed and stable  Post vital signs: Reviewed and stable  Complications: No apparent anesthesia complications

## 2014-10-22 NOTE — Anesthesia Postprocedure Evaluation (Signed)
  Anesthesia Post-op Note  Patient: Cynthia [name]  Procedure(s) Performed: Procedure(s) (LRB): RIGHT TOTAL HIP ARTHROPLASTY ANTERIOR APPROACH (Right)  Patient Location: PACU  Anesthesia Type: General  Level of Consciousness: awake and alert   Airway and Oxygen Therapy: Patient Spontanous Breathing  Post-op Pain: mild  Post-op Assessment: Post-op Vital signs reviewed, Patient's Cardiovascular Status Stable, Respiratory Function Stable, Patent Airway and No signs of Nausea or vomiting  Last Vitals:  Filed Vitals:   10/22/14 1345  BP:   Pulse: 95  Temp:   Resp: 17    Post-op Vital Signs: stable   Complications: No apparent anesthesia complications
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
## 2014-10-22 NOTE — Plan of Care (Signed)
Problem: Phase I Progression Outcomes Goal: Dangle or out of bed evening of surgery Outcome: Completed/Met Date Met:  10/22/14     

## 2014-10-22 NOTE — Progress Notes (Signed)
Portable AP Pelvis and Lateral Right Hip X-rays done. 

## 2014-10-23 ENCOUNTER — Encounter (HOSPITAL_COMMUNITY): Payer: Self-pay | Admitting: Orthopaedic Surgery

## 2014-10-23 LAB — GLUCOSE, CAPILLARY
GLUCOSE-CAPILLARY: 136 mg/dL — AB (ref 70–99)
Glucose-Capillary: 156 mg/dL — ABNORMAL HIGH (ref 70–99)
Glucose-Capillary: 154 mg/dL — ABNORMAL HIGH (ref 70–99)
Glucose-Capillary: 120 mg/dL — ABNORMAL HIGH (ref 70–99)

## 2014-10-23 MED ORDER — CYCLOBENZAPRINE HCL 10 MG PO TABS
10.0000 mg | ORAL_TABLET | Freq: Three times a day (TID) | ORAL | Status: DC | PRN
  Administered 2014-10-23 – 2014-10-25 (×2): 10 mg via ORAL
  Filled 2014-10-23 (×3): qty 1

## 2014-10-23 MED ORDER — BISACODYL 10 MG RE SUPP
10.0000 mg | Freq: Every day | RECTAL | Status: DC | PRN
  Administered 2014-10-23: 10 mg via RECTAL
  Filled 2014-10-23: qty 1

## 2014-10-23 NOTE — Plan of Care (Signed)
Problem: Phase I Progression Outcomes Goal: Other Phase I Outcomes/Goals Outcome: Not Applicable Date Met:  10/23/14     

## 2014-10-23 NOTE — Evaluation (Signed)
Occupational Therapy Evaluation Patient Details Name: Cynthia [name] MRN: [id number] DOB: 1956/10/05 Today's Date: 10/23/2014    History of Present Illness R THR   Clinical Impression   This 58 year old female was admitted for DA THA on R.  She will benefit from continued OT focusing on toileting; husband will assist with ADLs.  Pt has difficulty advancing RLE and compensates.      Follow Up Recommendations  No OT follow up;Supervision/Assistance - 24 hour    Equipment Recommendations  3 in 1 bedside comode (delivered)    Recommendations for Other Services       Precautions / Restrictions Precautions Precautions: Fall Restrictions Other Position/Activity Restrictions: WBAT      Mobility Bed Mobility         Supine to sit: Min assist     General bed mobility comments: assist for RLE  Transfers   Equipment used: Rolling walker (2 wheeled) Transfers: Sit to/from Stand Sit to Stand: Min assist         General transfer comment: cues for LE management and use of UEs to self assist    Balance                                            ADL Overall ADL's : Needs assistance/impaired      Grooming:  Supervision;washed hands; stand       Lower Body Bathing: Minimal assistance;With adaptive equipment;Sit to/from stand       Lower Body Dressing: Maximal assistance;With adaptive equipment;Sit to/from stand Lexicographer(reacher)   Toilet Transfer: Minimal assistance;Ambulation;BSC   Toileting- ArchitectClothing Manipulation and Hygiene: Min guard;Sit to/from stand         General ADL Comments: ambulated to bathroom to use commode.  Pt has difficulty advancing RLE and hikes hip/laterally leans to L to clear.  Pt had pain but wanted to walk into bathroom.  when seated at EOB, introduced reacher.  Pt has difficulty lifting RLE and hooked L under heel to clear when using reacher:  she states she will have her husband help her.  Recommended long netted sponge on a  stick, available in any bath and beauty section, if she wants to wash her own feet.  Pt's tub is upstairs and she wants DME for this.  3:1 may or may not fit, depending on width of tub/bevelling.  Showed tub bench, but recommended she wait before purchasing DME for tub:  3:1 may fit and she may not need bench by the time she can get upstairs.  Pt states she wants bench instead of 3:1 -- told her she really needs this for over toilet and may need it by bedside at night, if she has urgency     Vision                     Perception     Praxis      Pertinent Vitals/Pain Pain Score: 8  Pain Location: R hip/thigh Pain Descriptors / Indicators: Aching Pain Intervention(s): Limited activity within patient's tolerance;Monitored during session;Repositioned;Ice applied;Patient requesting pain meds-RN notified     Hand Dominance Right   Extremity/Trunk Assessment Upper Extremity Assessment Upper Extremity Assessment: Overall WFL for tasks assessed           Communication Communication Communication: No difficulties   Cognition Arousal/Alertness: Awake/alert Behavior During Therapy: WFL for tasks assessed/performed Overall Cognitive  Status: Within Functional Limits for tasks assessed                     General Comments       Exercises       Shoulder Instructions      Home Living Family/patient expects to be discharged to:: Private residence Living Arrangements: Spouse/significant other Available Help at Discharge: Family               Bathroom Shower/Tub: Tub/shower unit;Curtain (upstairs)   Bathroom Toilet: Standard     Home Equipment: Cane - single point   Additional Comments: 3:1 was delivered to room      Prior Functioning/Environment Level of Independence: Independent;Independent with assistive device(s)             OT Diagnosis: Generalized weakness   OT Problem List: Decreased strength;Decreased activity tolerance;Decreased  knowledge of use of DME or AE;Pain   OT Treatment/Interventions: Self-care/ADL training;DME and/or AE instruction;Patient/family education    OT Goals(Current goals can be found in the care plan section) Acute Rehab OT Goals Patient Stated Goal: Resume previous lifestyle with decreased pain OT Goal Formulation: With patient Time For Goal Achievement: 10/30/14 Potential to Achieve Goals: Good ADL Goals Pt Will Transfer to Toilet: with supervision;ambulating;bedside commode Pt Will Perform Toileting - Clothing Manipulation and hygiene: with supervision;sit to/from stand  OT Frequency: Min 2X/week   Barriers to D/C:            Co-evaluation              End of Session    Activity Tolerance: Patient tolerated treatment well Patient left: in bed;with call bell/phone within reach   Time: [id number] OT Time Calculation (min): 36 min Charges:  OT General Charges $OT Visit: 1 Procedure OT Evaluation $Initial OT Evaluation Tier I: 1 Procedure OT Treatments $Self Care/Home Management : 23-37 mins G-Codes:    [name] 10/23/2014, 2:02 PM  [name], OTR/L [phone] 10/23/2014

## 2014-10-23 NOTE — Progress Notes (Signed)
Physical Therapy Treatment Patient Details Name: Cynthia [name] MRN: [id number] DOB: 07-Jan-1956 Today's Date: 10/23/2014    History of Present Illness R THR    PT Comments    Progressing with mobility but ltd by pain  Follow Up Recommendations  Home health PT     Equipment Recommendations  Rolling walker with 5" wheels    Recommendations for Other Services OT consult     Precautions / Restrictions Precautions Precautions: Fall Restrictions Weight Bearing Restrictions: No Other Position/Activity Restrictions: WBAT    Mobility  Bed Mobility Overal bed mobility: Needs Assistance Bed Mobility: Sit to Supine       Sit to supine: Min assist;Mod assist   General bed mobility comments: cues for sequence and use of L LE to self assist  Transfers Overall transfer level: Needs assistance Equipment used: Rolling walker (2 wheeled) Transfers: Sit to/from Stand Sit to Stand: Min assist;Mod assist         General transfer comment: cues for LE management and use of UEs to self assist  Ambulation/Gait Ambulation/Gait assistance: Min assist Ambulation Distance (Feet): 44 Feet Assistive device: Rolling walker (2 wheeled) Gait Pattern/deviations: Step-to pattern;Decreased step length - left;Decreased step length - right;Shuffle;Antalgic;Trunk flexed Gait velocity: decr   General Gait Details: Cues for posture, sequence, and position from [facility] [employer]   Stairs            Wheelchair Mobility    Modified Rankin (Stroke Patients Only)       Balance                                    Cognition Arousal/Alertness: Awake/alert Behavior During Therapy: WFL for tasks assessed/performed Overall Cognitive Status: Within Functional Limits for tasks assessed                      Exercises Total Joint Exercises Ankle Circles/Pumps: AROM;Both;15 reps;Supine Quad Sets: AROM;Both;10 reps;Supine Heel Slides: AAROM;Right;Supine;15 reps Hip  ABduction/ADduction: AAROM;Right;Supine;10 reps    General Comments        Pertinent Vitals/Pain Pain Assessment: 0-10 Pain Score: 6  Pain Location: R hip/thigh Pain Descriptors / Indicators: Aching;Burning Pain Intervention(s): Limited activity within patient's tolerance;Monitored during session;Premedicated before session;Ice applied    Home Living                      Prior Function            PT Goals (current goals can now be found in the care plan section) Acute Rehab PT Goals Patient Stated Goal: Resume previous lifestyle with decreased pain PT Goal Formulation: With patient Time For Goal Achievement: 10/29/14 Potential to Achieve Goals: Good Progress towards PT goals: Progressing toward goals    Frequency  7X/week    PT Plan Current plan remains appropriate    Co-evaluation             End of Session   Activity Tolerance: Patient tolerated treatment well Patient left: in bed;with call bell/phone within reach     Time: 0853-0928 PT Time Calculation (min) (ACUTE ONLY): 35 min  Charges:  $Gait Training: 8-22 mins $Therapeutic Exercise: 8-22 mins                    G Codes:      Keaundra Stehle 10/23/2014, 12:23 PM

## 2014-10-23 NOTE — Progress Notes (Signed)
Subjective: 1 Day Post-Op Procedure(s) (LRB): RIGHT TOTAL HIP ARTHROPLASTY ANTERIOR APPROACH (Right) Patient reports pain as moderate.    Objective: Vital signs in last 24 hours: Temp:  [97.8 F (36.6 C)-99.6 F (37.6 C)] 99.2 F (37.3 C) (12/11 0506) Pulse Rate:  [90-122] 105 (12/11 0506) Resp:  [10-22] 18 (12/11 0506) BP: (119-170)/(61-99) 119/70 mmHg (12/11 0506) SpO2:  [88 %-99 %] 98 % (12/11 0506) Weight:  [107.049 kg (236 lb)] 107.049 kg (236 lb) (12/10 1500)  Intake/Output from previous day: 12/10 0701 - 12/11 0700 In: 3120 [P.O.:1320; I.V.:1585; IV Piggyback:215] Out: 3350 [Urine:2800; Blood:550] Intake/Output this shift: Total I/O In: 60 [P.O.:60] Out: -   No results for input(s): HGB in the last 72 hours. No results for input(s): WBC, RBC, HCT, PLT in the last 72 hours. No results for input(s): NA, K, CL, CO2, BUN, CREATININE, GLUCOSE, CALCIUM in the last 72 hours.  Recent Labs  10/22/14 0935  INR 1.11    Sensation intact distally Intact pulses distally Dorsiflexion/Plantar flexion intact Incision: dressing C/D/I  Assessment/Plan: 1 Day Post-Op Procedure(s) (LRB): RIGHT TOTAL HIP ARTHROPLASTY ANTERIOR APPROACH (Right) Up with therapy Discharge home with home health next 1-2 days  BLACKMAN,CHRISTOPHER Y 10/23/2014, 9:50 AM

## 2014-10-23 NOTE — Care Management Note (Signed)
    Page 1 of 2   10/23/2014     11:14:09 AM CARE MANAGEMENT NOTE 10/23/2014  Patient:  Cynthia [name],Cynthia [name]   Account Number:  [id number]  Date Initiated:  10/23/2014  Documentation initiated by:  Lorenda IshiharaPEELE,Trivia Heffelfinger  Subjective/Objective Assessment:   58 yo female admitted s/p Right total hip arthroplasty through direct anterior  approach. PTA lived at home with spouse.     Action/Plan:   Home when stable   Anticipated DC Date:  10/26/2014   Anticipated DC Plan:  HOME/SELF CARE      DC Planning Services  CM consult      [facility]  DURABLE MEDICAL EQUIPMENT   Choice offered to / List presented to:  Cynthia [name] Patient   DME arranged  3-N-1  Levan HurstWALKER - ROLLING      DME agency  [facility]     HH arranged  HH-2 PT      [facility]   Status of service:  Completed, signed off Medicare Important Message given?   (If response is "NO", the following Medicare IM given date fields will be blank) Date Medicare IM given:   Medicare IM given by:   Date Additional Medicare IM given:   Additional Medicare IM given by:    Discharge Disposition:  HOME W HOME HEALTH SERVICES  Per UR Regulation:  Reviewed for med. necessity/level of care/duration of stay  If discussed at Long Length of Stay Meetings, dates discussed:    Comments:  10-23-14 Lorenda IshiharaSuzanne Eiliana Drone RN CM 1100 Spoke with patient and family at bedside. [facility] Contacted [facility] to arrange. DME per patient request.

## 2014-10-23 NOTE — Progress Notes (Signed)
Inpatient Diabetes Program Recommendations  AACE/ADA: New Consensus Statement on Inpatient Glycemic Control (2013)  Target Ranges:  Prepandial:   less than 140 mg/dL      Peak postprandial:   less than 180 mg/dL (1-2 hours)      Critically ill patients:  140 - 180 mg/dL   Results for Cynthia [name], [name] (MRN [id number]) as of 10/23/2014 09:15  Ref. Range 10/22/2014 09:09 10/22/2014 13:35 10/22/2014 16:25 10/22/2014 18:12 10/22/2014 20:57 10/22/2014 22:53 10/23/2014 07:33  Glucose-Capillary Latest Range: 70-99 mg/dL 409128 (H) 811223 (H) 914219 (H) 217 (H) 321 (H) 282 (H) 156 (H)   Diabetes history: DM2 Outpatient Diabetes medications: Lantus 10 units QHS, Glipizide 10 mg BID, Metformin 500 mg BID Current orders for Inpatient glycemic control: Lantus 10 units QHS, Glipizide 10 mg BID, Metformin 500 mg BID  Inpatient Diabetes Program Recommendations Correction (SSI): Please consider ordering Novolog correction scale ACHS.  Thanks, Orlando PennerMarie Charo Philipp, RN, MSN, CCRN, CDE Diabetes Coordinator Inpatient Diabetes Program [phone] (Team Pager) [phone] (AP office) [phone] [facility] office)

## 2014-10-23 NOTE — Progress Notes (Signed)
Physical Therapy Treatment Patient Details Name: Cynthia [name] MRN: [id number] DOB: 02-08-1956 Today's Date: 10/23/2014    History of Present Illness R THR    PT Comments    Progressing slowly - limited this pm by fatigue and pain level.  Follow Up Recommendations  Home health PT     Equipment Recommendations  Rolling walker with 5" wheels    Recommendations for Other Services OT consult     Precautions / Restrictions Precautions Precautions: Fall Restrictions Weight Bearing Restrictions: No Other Position/Activity Restrictions: WBAT    Mobility  Bed Mobility         Supine to sit: Min assist     General bed mobility comments: assist for RLE  Transfers Overall transfer level: Needs assistance Equipment used: Rolling walker (2 wheeled) Transfers: Sit to/from Stand Sit to Stand: Min assist         General transfer comment: cues for LE management and use of UEs to self assist  Ambulation/Gait Ambulation/Gait assistance: Min assist Ambulation Distance (Feet): 50 Feet Assistive device: Rolling walker (2 wheeled) Gait Pattern/deviations: Step-to pattern;Decreased step length - right;Decreased step length - left;Shuffle;Antalgic;Trunk flexed Gait velocity: decr   General Gait Details: Cues for posture, sequence, and position from [facility] [employer]   Stairs            Wheelchair Mobility    Modified Rankin (Stroke Patients Only)       Balance                                    Cognition Arousal/Alertness: Awake/alert Behavior During Therapy: WFL for tasks assessed/performed Overall Cognitive Status: Within Functional Limits for tasks assessed                      Exercises      General Comments        Pertinent Vitals/Pain Pain Assessment: 0-10 Pain Score: 8  Pain Location: R hip/thigh Pain Descriptors / Indicators: Aching;Burning;Sore Pain Intervention(s): Limited activity within patient's tolerance;Monitored during  session;Premedicated before session;Ice applied;Patient requesting pain meds-RN notified    Home Living Family/patient expects to be discharged to:: Private residence Living Arrangements: Spouse/significant other Available Help at Discharge: Family         Home Equipment: Gilmer Morane - single point Additional Comments: 3:1 was delivered to room    Prior Function Level of Independence: Independent;Independent with assistive device(s)          PT Goals (current goals can now be found in the care plan section) Acute Rehab PT Goals Patient Stated Goal: Resume previous lifestyle with decreased pain PT Goal Formulation: With patient Time For Goal Achievement: 10/29/14 Potential to Achieve Goals: Good Progress towards PT goals: Progressing toward goals    Frequency  7X/week    PT Plan Current plan remains appropriate    Co-evaluation             End of Session   Activity Tolerance: Patient limited by fatigue;Patient limited by pain Patient left: in chair;with call bell/phone within reach     Time: 1352-1410 PT Time Calculation (min) (ACUTE ONLY): 18 min  Charges:  $Gait Training: 8-22 mins                    G Codes:      Cynthia [name] 10/23/2014, 2:24 PM

## 2014-10-23 NOTE — Progress Notes (Signed)
[facility]   [facility] the [facility] and [facility]  If patient discharges after hours, please call [phone].   Cynthia HamperLecretia [name] 10/23/2014, 10:54 AM

## 2014-10-24 LAB — GLUCOSE, CAPILLARY
GLUCOSE-CAPILLARY: 158 mg/dL — AB (ref 70–99)
Glucose-Capillary: 132 mg/dL — ABNORMAL HIGH (ref 70–99)
Glucose-Capillary: 186 mg/dL — ABNORMAL HIGH (ref 70–99)

## 2014-10-24 MED ORDER — TIZANIDINE HCL 4 MG PO TABS: 4.0000 mg | ORAL_TABLET | Freq: Three times a day (TID) | ORAL | Status: DC | PRN

## 2014-10-24 MED ORDER — FERROUS SULFATE 325 (65 FE) MG PO TABS: 325.0000 mg | ORAL_TABLET | Freq: Three times a day (TID) | ORAL | Status: DC

## 2014-10-24 MED ORDER — ASPIRIN 325 MG PO TBEC
325.0000 mg | DELAYED_RELEASE_TABLET | Freq: Two times a day (BID) | ORAL | Status: DC
Start: 2014-10-24 — End: 2014-11-12

## 2014-10-24 MED ORDER — DSS 100 MG PO CAPS: 100.0000 mg | ORAL_CAPSULE | Freq: Two times a day (BID) | ORAL | Status: DC

## 2014-10-24 MED ORDER — OXYCODONE-ACETAMINOPHEN 5-325 MG PO TABS: 1.0000 | ORAL_TABLET | ORAL | Status: DC | PRN

## 2014-10-24 NOTE — Plan of Care (Signed)
Problem: Phase III Progression Outcomes Goal: Anticoagulant follow-up in place Outcome: Not Applicable Date Met:  34/28/76 asa

## 2014-10-24 NOTE — Progress Notes (Signed)
Physical Therapy Treatment Patient Details Name: Cynthia [name] MRN: [id number] DOB: Feb 28, 1956 Today's Date: 10/24/2014    History of Present Illness R THR    PT Comments    Progressing slowly with mobility. Pt continues to have issues with pain control-pleasant but frustrated/tearful at times. Plan is for d/c home on tomorrow. Needs/wants to practice steps again.   Follow Up Recommendations  Home health PT     Equipment Recommendations  Rolling walker with 5" wheels    Recommendations for Other Services OT consult     Precautions / Restrictions Precautions Precautions: Fall Restrictions Weight Bearing Restrictions: No Other Position/Activity Restrictions: WBAT    Mobility  Bed Mobility Overal bed mobility: Needs Assistance Bed Mobility: Supine to Sit     Supine to sit: Min guard     General bed mobility comments: increased time.  Pt uses Lt LE to assist R LE  Transfers Overall transfer level: Needs assistance Equipment used: Rolling walker (2 wheeled) Transfers: Sit to/from Stand Sit to Stand: Min guard;From elevated surface         General transfer comment: close guard for safety. VCs safety, technique, hand placmeent. Increased time.   Ambulation/Gait Ambulation/Gait assistance: Min guard Ambulation Distance (Feet): 45 Feet Assistive device: Rolling walker (2 wheeled) Gait Pattern/deviations: Step-to pattern;Decreased stride length;Antalgic;Trunk flexed     General Gait Details: pt with step to pattern, lateral trunk lean to clear RLE.  pt with decreased hip and knee flexion on R LE. Pt became sweaty while walking and requested to sit. Recliner brought out and pt wheeled back to room. BP 112/77 (pt states this is low for her)   Stairs Stairs: Yes     Number of Stairs: 2 General stair comments: up and over portable steps x 1 with use of rail on 1 side and 1HHA on opposite side. VCS safety, technique, sequence. Recommended to pt that she have husband  and son assist when negotiating steps.  Wheelchair Mobility    Modified Rankin (Stroke Patients Only)       Balance                                    Cognition Arousal/Alertness: Awake/alert Behavior During Therapy: WFL for tasks assessed/performed Overall Cognitive Status: Within Functional Limits for tasks assessed                      Exercises      General Comments        Pertinent Vitals/Pain Pain Assessment: 0-10 Pain Score: 8  Pain Location: R hip/thigh Pain Descriptors / Indicators: Aching;Sore Pain Intervention(s): Monitored during session;Premedicated before session;Ice applied    Home Living                      Prior Function            PT Goals (current goals can now be found in the care plan section) Progress towards PT goals: Progressing toward goals    Frequency  7X/week    PT Plan Current plan remains appropriate    Co-evaluation             End of Session   Activity Tolerance: Patient limited by pain Patient left: in chair;with call bell/phone within reach     Time: 1340-1423 PT Time Calculation (min) (ACUTE ONLY): 43 min  Charges:  $Gait Training: 23-37 mins $Therapeutic  Activity: 8-22 mins                    G Codes:      Cynthia [name], MPT Pager: [phone]

## 2014-10-24 NOTE — Progress Notes (Signed)
10/24/2014 1300 Notified [facility] that pt is dc home today. Pt has DME in the room. Isidoro DonningAlesia Cashel Bellina RNCCM Case Mgmt phone [phone]

## 2014-10-24 NOTE — Progress Notes (Signed)
OT Cancellation Note  Patient Details Name: Cynthia [name] MRN: [id number] DOB: [birth year]   Cancelled Treatment:    Reason Eval/Treat Not Completed: Pain and fatigue limiting ability to participate. Patient reports she had a "bad night" and would like to rest. Will check on patient tomorrow for further OT treatment.  Narcisa Ganesh A 10/24/2014, 10:21 AM

## 2014-10-24 NOTE — Progress Notes (Signed)
Subjective: Pt stable but hasnt slept much   Objective: Vital signs in last 24 hours: Temp:  [99 F (37.2 C)-100.4 F (38 C)] 99.5 F (37.5 C) (12/12 0658) Pulse Rate:  [97-100] 97 (12/12 0658) Resp:  [15-56] 20 (12/12 0658) BP: (108-151)/(64-74) 124/64 mmHg (12/12 0658) SpO2:  [95 %-100 %] 95 % (12/12 0658)  Intake/Output from previous day: 12/11 0701 - 12/12 0700 In: 1500 [P.O.:900; I.V.:600] Out: 850 [Urine:850] Intake/Output this shift:    Exam:  Neurovascular intact Sensation intact distally Intact pulses distally  Labs: No results for input(s): HGB in the last 72 hours. No results for input(s): WBC, RBC, HCT, PLT in the last 72 hours. No results for input(s): NA, K, CL, CO2, BUN, CREATININE, GLUCOSE, CALCIUM in the last 72 hours.  Recent Labs  10/22/14 0935  INR 1.11    Assessment/Plan: Plan PT and mobilization today - not yet ready for dc   DEAN,GREGORY SCOTT 10/24/2014, 9:24 AM

## 2014-10-24 NOTE — Progress Notes (Signed)
Physical Therapy Treatment Patient Details Name: Erlene Quanyise C Macfadden MRN: [id number] DOB: [birth year] Today's Date: 10/24/2014    History of Present Illness R THR    PT Comments    Pt continues with increased pain.  Motivated to improve and increase gait distance.  Follow Up Recommendations  Home health PT     Equipment Recommendations  Rolling walker with 5" wheels    Recommendations for Other Services       Precautions / Restrictions Precautions Precautions: Fall Restrictions Weight Bearing Restrictions: No Other Position/Activity Restrictions: WBAT    Mobility  Bed Mobility   Bed Mobility: Supine to Sit     Supine to sit: Supervision     General bed mobility comments: increased time.  Pt uses Lt LE to assist R LE  Transfers   Equipment used: Rolling walker (2 wheeled) Transfers: Sit to/from Stand Sit to Stand: Min assist         General transfer comment: initially able to perform with supervision with cues for technique, with fatigue, pt requires min A  Ambulation/Gait Ambulation/Gait assistance: Min guard Ambulation Distance (Feet): 75 Feet (x 2) Assistive device: Rolling walker (2 wheeled)       General Gait Details: pt with step to pattern, lateral trunk lean to clear RLE.  pt with decreased hip and knee flexion on R LE. requires seated rest break between 2 sets of gait   Stairs            Wheelchair Mobility    Modified Rankin (Stroke Patients Only)       Balance                                    Cognition Arousal/Alertness: Awake/alert Behavior During Therapy: WFL for tasks assessed/performed Overall Cognitive Status: Within Functional Limits for tasks assessed                      Exercises      General Comments        Pertinent Vitals/Pain Pain Score: 8  Pain Location: R hip/thigh Pain Descriptors / Indicators: Aching;Cramping    Home Living                      Prior Function            PT Goals (current goals can now be found in the care plan section) Progress towards PT goals: Progressing toward goals    Frequency  7X/week    PT Plan Current plan remains appropriate    Co-evaluation             End of Session Equipment Utilized During Treatment: Gait belt Activity Tolerance: Patient limited by pain Patient left: in chair;with call bell/phone within reach     Time: 0845-0908 PT Time Calculation (min) (ACUTE ONLY): 23 min  Charges:  $Gait Training: 23-37 mins                    G Codes:      Lucely Leard 10/24/2014, 10:42 AM

## 2014-10-25 LAB — GLUCOSE, CAPILLARY
GLUCOSE-CAPILLARY: 158 mg/dL — AB (ref 70–99)
GLUCOSE-CAPILLARY: 135 mg/dL — AB (ref 70–99)
Glucose-Capillary: 186 mg/dL — ABNORMAL HIGH (ref 70–99)
Glucose-Capillary: 102 mg/dL — ABNORMAL HIGH (ref 70–99)
Glucose-Capillary: 139 mg/dL — ABNORMAL HIGH (ref 70–99)
Glucose-Capillary: 161 mg/dL — ABNORMAL HIGH (ref 70–99)

## 2014-10-25 MED ORDER — TIZANIDINE HCL 4 MG PO TABS
4.0000 mg | ORAL_TABLET | Freq: Four times a day (QID) | ORAL | Status: DC | PRN
  Administered 2014-10-25 – 2014-10-26 (×3): 4 mg via ORAL
  Filled 2014-10-25 (×4): qty 1

## 2014-10-25 NOTE — Progress Notes (Signed)
Subjective: 3 Days Post-Op Procedure(s) (LRB): RIGHT TOTAL HIP ARTHROPLASTY ANTERIOR APPROACH (Right) Patient reports pain as moderate.  Very slow progress with therapy.  Not safe likely to discharge today.  Objective: Vital signs in last 24 hours: Temp:  [99.6 F (37.6 C)-102.2 F (39 C)] 99.6 F (37.6 C) (12/13 0659) Pulse Rate:  [92-105] 92 (12/13 0430) Resp:  [18-20] 20 (12/13 0430) BP: (100-119)/(59) 100/59 mmHg (12/13 0430) SpO2:  [95 %-96 %] 95 % (12/13 0430)  Intake/Output from previous day: 12/12 0701 - 12/13 0700 In: 600 [P.O.:600] Out: 300 [Urine:300] Intake/Output this shift:    No results for input(s): HGB in the last 72 hours. No results for input(s): WBC, RBC, HCT, PLT in the last 72 hours. No results for input(s): NA, K, CL, CO2, BUN, CREATININE, GLUCOSE, CALCIUM in the last 72 hours.  Recent Labs  10/22/14 0935  INR 1.11    Sensation intact distally Intact pulses distally Dorsiflexion/Plantar flexion intact Incision: scant drainage  Assessment/Plan: 3 Days Post-Op Procedure(s) (LRB): RIGHT TOTAL HIP ARTHROPLASTY ANTERIOR APPROACH (Right) Up with therapy Plan for discharge tomorrow  Kathryne HitchBLACKMAN,Jazzlynn Rawe Y 10/25/2014, 9:14 AM

## 2014-10-25 NOTE — Progress Notes (Signed)
OT Cancellation Note  Patient Details Name: Cynthia [name] MRN: [id number] DOB: 1956-03-30   Cancelled Treatment:    Reason Eval/Treat Not Completed: Pain limiting ability to participate. Patient reports muscled spasms, just got new medication for it, but said she did not want to try getting up right now. Will have OT check on her tomorrow.  Qualyn Oyervides A 10/25/2014, 11:57 AM

## 2014-10-25 NOTE — Progress Notes (Signed)
Physical Therapy Treatment Patient Details Name: Erlene Quanyise C Bondar MRN: [id number] DOB: 1956/03/07 Today's Date: 10/25/2014    History of Present Illness R THR    PT Comments    POD # 3 am session.  Max c/o muscle spasms throughout low back and R hip inhibited pt progress.  Assited OOB required increased time and nearly 18 min to get from supine to in BR.  Moving slowly.  Assited in BR then pt was only able to amb to recliner.  Pt tearful and emotionally upset.  Given encouragement and positive support.  MD in hallway at time, plan to stay one more day.   Follow Up Recommendations  Home health PT     Equipment Recommendations  Rolling walker with 5" wheels    Recommendations for Other Services       Precautions / Restrictions Precautions Precautions: Fall Restrictions Weight Bearing Restrictions: No Other Position/Activity Restrictions: WBAT    Mobility  Bed Mobility Overal bed mobility: Needs Assistance Bed Mobility: Supine to Sit     Supine to sit: Min assist     General bed mobility comments: increased time plus asssit with R LE  Transfers Overall transfer level: Needs assistance Equipment used: Rolling walker (2 wheeled) Transfers: Sit to/from Stand Sit to Stand: Min guard;Min assist         General transfer comment: close guard for safety. VCs safety, technique, hand placmeent. Increased time. assisted of bed and elevated toilet.  Ambulation/Gait Ambulation/Gait assistance: Min guard Ambulation Distance (Feet): 22 Feet (limited amb distance due to spasms) Assistive device: Rolling walker (2 wheeled) Gait Pattern/deviations: Step-to pattern;Trunk flexed;Decreased stance time - right Gait velocity: decreased   General Gait Details: amb from bed to BR then to recliner.  Pt unable to attempt amb in hallway due to MAX c/o muscle spasms and inability to functionally amb.  Pt tearful and emotionally upset/discouraged.   Stairs Stairs:  (unable to attempt this  session.)          Wheelchair Mobility    Modified Rankin (Stroke Patients Only)       Balance                                    Cognition                            Exercises      General Comments        Pertinent Vitals/Pain Pain Assessment: 0-10 Pain Score: 10-Worst pain ever Pain Location: R hip (spasms) Pain Descriptors / Indicators: Aching;Cramping;Crushing;Crying;Shooting;Sore;Spasm;Throbbing;Tightness;Penetrating Pain Intervention(s): Monitored during session;Premedicated before session;Repositioned;Ice applied    Home Living                      Prior Function            PT Goals (current goals can now be found in the care plan section) Progress towards PT goals: Progressing toward goals    Frequency  7X/week    PT Plan      Co-evaluation             End of Session Equipment Utilized During Treatment: Gait belt Activity Tolerance: Patient limited by pain Patient left: in chair;with call bell/phone within reach     Time: 0833-0915 PT Time Calculation (min) (ACUTE ONLY): 42 min  Charges:  $Gait Training: 8-22 mins $Therapeutic Activity:  23-37 mins                    G Codes:      Felecia ShellingLori Amara Justen  PTA [facility]  Acute  Rehab Pager      [phone]

## 2014-10-25 NOTE — Progress Notes (Signed)
Physical Therapy Treatment Patient Details Name: Cynthia [name] MRN: [id number] DOB: [birth year] Today's Date: 10/25/2014    History of Present Illness R THR    PT Comments    POD # 3 pm session.  Pt tolerated increased amb distance and decreased pain level.  Amb in hallway, assisted to BR then assisted back to bed.    Follow Up Recommendations  Home health PT     Equipment Recommendations  Rolling walker with 5" wheels    Recommendations for Other Services       Precautions / Restrictions Precautions Precautions: Fall Restrictions Weight Bearing Restrictions: No Other Position/Activity Restrictions: WBAT    Mobility  Bed Mobility Overal bed mobility: Needs Assistance Bed Mobility: Supine to Sit     Supine to sit: Min assist     General bed mobility comments: increased time plus asssit with R LE  Transfers Overall transfer level: Needs assistance Equipment used: Rolling walker (2 wheeled) Transfers: Sit to/from Stand Sit to Stand: Min guard;Min assist         General transfer comment: close guard for safety. VCs safety, technique, hand placmeent. Increased time. assisted of bed and elevated toilet.  Ambulation/Gait Ambulation/Gait assistance: Min guard Ambulation Distance (Feet): 45 Feet Assistive device: Rolling walker (2 wheeled) Gait Pattern/deviations: Step-to pattern;Trunk flexed;Decreased stance time - right Gait velocity: decreased   General Gait Details: amb from bed to BR then to recliner.  Pt unable to attempt amb in hallway due to MAX c/o muscle spasms and inability to functionally amb.  Pt tearful and emotionally upset/discouraged.   Stairs Stairs:  (unable to attempt this session.)          Wheelchair Mobility    Modified Rankin (Stroke Patients Only)       Balance                                    Cognition                            Exercises      General Comments        Pertinent  Vitals/Pain Pain Assessment: 0-10 Pain Score: 5 Pain Location: R hip (spasms) Pain Descriptors / Indicators: Aching; Pain Intervention(s): Monitored during session;Premedicated before session;Repositioned;Ice applied    Home Living                      Prior Function            PT Goals (current goals can now be found in the care plan section) Progress towards PT goals: Progressing toward goals    Frequency  7X/week    PT Plan      Co-evaluation             End of Session Equipment Utilized During Treatment: Gait belt Activity Tolerance: Patient limited by pain Patient left: in chair;with call bell/phone within reach     Time: 1225-1249 PT Time Calculation (min) (ACUTE ONLY): 24 min  Charges:  $Gait Training: 8-22 mins $Therapeutic Activity: 8-22 mins                    G Codes:      Felecia ShellingLori Mckinzi Eriksen  PTA [facility]  Acute  Rehab Pager      [phone]

## 2014-10-26 LAB — GLUCOSE, CAPILLARY
GLUCOSE-CAPILLARY: 128 mg/dL — AB (ref 70–99)
GLUCOSE-CAPILLARY: 161 mg/dL — AB (ref 70–99)

## 2014-10-26 MED ORDER — HYDROCODONE-ACETAMINOPHEN 10-325 MG PO TABS: 1.0000 | ORAL_TABLET | ORAL | Status: DC | PRN

## 2014-10-26 MED ORDER — HYDROCODONE-ACETAMINOPHEN 10-325 MG PO TABS
1.0000 | ORAL_TABLET | ORAL | Status: DC | PRN
  Administered 2014-10-26: 1 via ORAL
  Administered 2014-10-26 (×2): 2 via ORAL
  Administered 2014-10-26: 1 via ORAL
  Filled 2014-10-26: qty 1
  Filled 2014-10-26: qty 2
  Filled 2014-10-26: qty 1
  Filled 2014-10-26: qty 2

## 2014-10-26 NOTE — Progress Notes (Signed)
Patient ID: Cynthia [name], female   DOB: 16-Jul-1956, 58 y.o.   MRN: [id number] Slowly improving with mobility and pain tolerance.  H/H low, but stable and asymptomatic.  Will not check H/H since she is a [employer] and will not accept blood products.  On iron.  Can discharge to home today.

## 2014-10-26 NOTE — Progress Notes (Signed)
Discharge summary sent to payer through [facility]  
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
## 2014-10-26 NOTE — Progress Notes (Signed)
RN reviewed discharge instructions with patient and family. All questions answered.   Paperwork and prescriptions given.   NT rolled patient down in wheelchair.

## 2014-10-26 NOTE — Progress Notes (Signed)
Physical Therapy Treatment Patient Details Name: Cynthia [name] MRN: [id number] DOB: [birth year] Today's Date: 10/26/2014    History of Present Illness R THR    PT Comments    POD # 3 pt doing much better.  No c/o spasms and moving better.  Spouse present so practiced going up one step forward with RW.  Also given handout HEP and instructed on freq and use of ICE after.  All other questions addressed.  Pt ready for D/C to home.  Follow Up Recommendations  Home health PT     Equipment Recommendations  Rolling walker with 5" wheels    Recommendations for Other Services       Precautions / Restrictions Precautions Precautions: Fall Restrictions Weight Bearing Restrictions: No Other Position/Activity Restrictions: WBAT    Mobility  Bed Mobility               General bed mobility comments: Pt OOB in recliner  Transfers Overall transfer level: Needs assistance Equipment used: Rolling walker (2 wheeled) Transfers: Sit to/from Stand Sit to Stand: Supervision         General transfer comment: good use of hands and good safety cognition  Ambulation/Gait Ambulation/Gait assistance: Supervision Ambulation Distance (Feet): 75 Feet Assistive device: Rolling walker (2 wheeled) Gait Pattern/deviations: Step-to pattern;Decreased stance time - right Gait velocity: decreased   General Gait Details: tolerated amb in hallway.  became diaphoretic.  sugar checked was 160.     Stairs Stairs: Yes Stairs assistance: Supervision Stair Management: No rails;Step to pattern;Forwards;With walker Number of Stairs: 1 General stair comments: with spouse practiced going up/down one step.  Wheelchair Mobility    Modified Rankin (Stroke Patients Only)       Balance                                    Cognition                            Exercises      General Comments        Pertinent Vitals/Pain Pain Assessment: 0-10 Pain Score: 3  Pain  Location: R hip Pain Descriptors / Indicators: Sore Pain Intervention(s): Monitored during session;Premedicated before session;Repositioned;Ice applied    Home Living                      Prior Function            PT Goals (current goals can now be found in the care plan section) Progress towards PT goals: Progressing toward goals    Frequency  7X/week    PT Plan      Co-evaluation             End of Session Equipment Utilized During Treatment: Gait belt Activity Tolerance: Patient tolerated treatment well Patient left: in chair;with call bell/phone within reach     Time: 1120-1145 PT Time Calculation (min) (ACUTE ONLY): 25 min  Charges:  $Gait Training: 8-22 mins $Therapeutic Activity: 8-22 mins                    G Codes:      Felecia ShellingLori Terrion Gencarelli  PTA [facility]  Acute  Rehab Pager      [phone]

## 2014-11-09 ENCOUNTER — Inpatient Hospital Stay (HOSPITAL_COMMUNITY): Payer: 59 | Admitting: Anesthesiology

## 2014-11-09 ENCOUNTER — Encounter (HOSPITAL_COMMUNITY)
Admission: RE | Disposition: A | Discharge status: Home / Self Care | Payer: Self-pay | Source: Ambulatory Visit | Attending: Orthopaedic Surgery

## 2014-11-09 ENCOUNTER — Inpatient Hospital Stay (HOSPITAL_COMMUNITY)
Admission: RE | Admit: 2014-11-09 | Discharge: 2014-11-12 | DRG: 858 | Disposition: A | Discharge status: Home / Self Care | Payer: 59 | Source: Ambulatory Visit | Attending: Orthopaedic Surgery | Admitting: Orthopaedic Surgery

## 2014-11-09 ENCOUNTER — Encounter (HOSPITAL_COMMUNITY): Payer: Self-pay | Admitting: *Deleted

## 2014-11-09 ENCOUNTER — Other Ambulatory Visit (HOSPITAL_COMMUNITY): Payer: Self-pay | Admitting: Orthopaedic Surgery

## 2014-11-09 DIAGNOSIS — Z96641 Presence of right artificial hip joint: Secondary | ICD-10-CM | POA: Diagnosis present

## 2014-11-09 DIAGNOSIS — Z87891 Personal history of nicotine dependence: Secondary | ICD-10-CM | POA: Diagnosis not present

## 2014-11-09 DIAGNOSIS — J984 Other disorders of lung: Secondary | ICD-10-CM | POA: Diagnosis present

## 2014-11-09 DIAGNOSIS — I509 Heart failure, unspecified: Secondary | ICD-10-CM | POA: Diagnosis present

## 2014-11-09 DIAGNOSIS — T814XXA Infection following a procedure, initial encounter: Principal | ICD-10-CM | POA: Diagnosis present

## 2014-11-09 DIAGNOSIS — Y838 Other surgical procedures as the cause of abnormal reaction of the patient, or of later complication, without mention of misadventure at the time of the procedure: Secondary | ICD-10-CM | POA: Diagnosis present

## 2014-11-09 DIAGNOSIS — E119 Type 2 diabetes mellitus without complications: Secondary | ICD-10-CM | POA: Diagnosis present

## 2014-11-09 DIAGNOSIS — Z888 Allergy status to other drugs, medicaments and biological substances status: Secondary | ICD-10-CM | POA: Diagnosis not present

## 2014-11-09 DIAGNOSIS — I10 Essential (primary) hypertension: Secondary | ICD-10-CM | POA: Diagnosis present

## 2014-11-09 DIAGNOSIS — T8140XA Infection following a procedure, unspecified, initial encounter: Secondary | ICD-10-CM | POA: Diagnosis present

## 2014-11-09 DIAGNOSIS — Z794 Long term (current) use of insulin: Secondary | ICD-10-CM

## 2014-11-09 DIAGNOSIS — M797 Fibromyalgia: Secondary | ICD-10-CM | POA: Diagnosis present

## 2014-11-09 DIAGNOSIS — Z7982 Long term (current) use of aspirin: Secondary | ICD-10-CM

## 2014-11-09 DIAGNOSIS — F419 Anxiety disorder, unspecified: Secondary | ICD-10-CM | POA: Diagnosis present

## 2014-11-09 HISTORY — PX: INCISION AND DRAINAGE HIP: SHX1801

## 2014-11-09 HISTORY — DX: Reserved for inherently not codable concepts without codable children: IMO0001

## 2014-11-09 HISTORY — DX: Procedure and treatment not carried out because of patient's decision for reasons of belief and group pressure: Z53.1

## 2014-11-09 LAB — BASIC METABOLIC PANEL
ANION GAP: 10 (ref 5–15)
BUN: 12 mg/dL (ref 6–23)
CALCIUM: 9.6 mg/dL (ref 8.4–10.5)
CHLORIDE: 102 meq/L (ref 96–112)
CO2: 25 mmol/L (ref 19–32)
Creatinine, Ser: 0.97 mg/dL (ref 0.50–1.10)
GFR calc non Af Amer: 63 mL/min — ABNORMAL LOW (ref >90)
GFR, EST AFRICAN AMERICAN: 73 mL/min — AB (ref >90)
Glucose, Bld: 170 mg/dL — ABNORMAL HIGH (ref 70–99)
Potassium: 4 mmol/L (ref 3.5–5.1)
SODIUM: 137 mmol/L (ref 135–145)

## 2014-11-09 LAB — GLUCOSE, CAPILLARY
GLUCOSE-CAPILLARY: 154 mg/dL — AB (ref 70–99)
GLUCOSE-CAPILLARY: 123 mg/dL — AB (ref 70–99)
Glucose-Capillary: 127 mg/dL — ABNORMAL HIGH (ref 70–99)
Glucose-Capillary: 198 mg/dL — ABNORMAL HIGH (ref 70–99)

## 2014-11-09 LAB — CBC
HCT: 33.6 % — ABNORMAL LOW (ref 36.0–46.0)
HEMOGLOBIN: 10.4 g/dL — AB (ref 12.0–15.0)
MCH: 26.1 pg (ref 26.0–34.0)
MCHC: 31 g/dL (ref 30.0–36.0)
MCV: 84.4 fL (ref 78.0–100.0)
Platelets: 417 10*3/uL — ABNORMAL HIGH (ref 150–400)
RBC: 3.98 MIL/uL (ref 3.87–5.11)
RDW: 14.4 % (ref 11.5–15.5)
WBC: 5.2 10*3/uL (ref 4.0–10.5)

## 2014-11-09 LAB — SEDIMENTATION RATE: Sed Rate: 55 mm/hr — ABNORMAL HIGH (ref 0–22)

## 2014-11-09 SURGERY — IRRIGATION AND DEBRIDEMENT HIP
Anesthesia: General | Site: Hip | Laterality: Right

## 2014-11-09 MED ORDER — FENTANYL CITRATE 0.05 MG/ML IJ SOLN
INTRAMUSCULAR | Status: DC | PRN
  Administered 2014-11-09 (×4): 50 ug via INTRAVENOUS

## 2014-11-09 MED ORDER — LABETALOL HCL 200 MG PO TABS
200.0000 mg | ORAL_TABLET | Freq: Two times a day (BID) | ORAL | Status: DC
  Administered 2014-11-09 – 2014-11-12 (×4): 200 mg via ORAL
  Filled 2014-11-09 (×7): qty 1

## 2014-11-09 MED ORDER — LIDOCAINE HCL (CARDIAC) 20 MG/ML IV SOLN
INTRAVENOUS | Status: AC
Start: 2014-11-09 — End: 2014-11-09
  Filled 2014-11-09: qty 5

## 2014-11-09 MED ORDER — VANCOMYCIN HCL 10 G IV SOLR
1750.0000 mg | Freq: Once | INTRAVENOUS | Status: AC
  Administered 2014-11-09: 1750 mg via INTRAVENOUS
  Filled 2014-11-09: qty 1750

## 2014-11-09 MED ORDER — DIPHENHYDRAMINE HCL 12.5 MG/5ML PO ELIX: 12.5000 mg | ORAL_SOLUTION | ORAL | Status: DC | PRN

## 2014-11-09 MED ORDER — GLIPIZIDE 10 MG PO TABS
10.0000 mg | ORAL_TABLET | Freq: Two times a day (BID) | ORAL | Status: DC
  Administered 2014-11-10 – 2014-11-12 (×5): 10 mg via ORAL
  Filled 2014-11-09 (×8): qty 1

## 2014-11-09 MED ORDER — ONDANSETRON HCL 4 MG PO TABS: 4.0000 mg | ORAL_TABLET | Freq: Four times a day (QID) | ORAL | Status: DC | PRN

## 2014-11-09 MED ORDER — SODIUM CHLORIDE 0.9 % IV SOLN
INTRAVENOUS | Status: DC
  Administered 2014-11-09: 21:00:00 via INTRAVENOUS

## 2014-11-09 MED ORDER — FENTANYL CITRATE 0.05 MG/ML IJ SOLN
INTRAMUSCULAR | Status: AC
  Filled 2014-11-09: qty 5

## 2014-11-09 MED ORDER — ONDANSETRON HCL 4 MG/2ML IJ SOLN
4.0000 mg | Freq: Four times a day (QID) | INTRAMUSCULAR | Status: DC | PRN
  Filled 2014-11-09: qty 2

## 2014-11-09 MED ORDER — FUROSEMIDE 40 MG PO TABS
40.0000 mg | ORAL_TABLET | Freq: Every morning | ORAL | Status: DC
  Administered 2014-11-10 – 2014-11-12 (×3): 40 mg via ORAL
  Filled 2014-11-09 (×3): qty 1

## 2014-11-09 MED ORDER — SODIUM CHLORIDE 0.9 % IV SOLN
INTRAVENOUS | Status: DC | PRN
  Administered 2014-11-09: 19:00:00 via INTRAVENOUS

## 2014-11-09 MED ORDER — SODIUM CHLORIDE 0.9 % IR SOLN
Status: DC | PRN
  Administered 2014-11-09 (×2): 3000 mL

## 2014-11-09 MED ORDER — SIMVASTATIN 10 MG PO TABS
10.0000 mg | ORAL_TABLET | Freq: Every day | ORAL | Status: DC
  Administered 2014-11-09 – 2014-11-11 (×2): 10 mg via ORAL
  Filled 2014-11-09 (×4): qty 1

## 2014-11-09 MED ORDER — DOCUSATE SODIUM 100 MG PO CAPS
100.0000 mg | ORAL_CAPSULE | Freq: Two times a day (BID) | ORAL | Status: DC
  Administered 2014-11-09 – 2014-11-12 (×5): 100 mg via ORAL
  Filled 2014-11-09 (×7): qty 1

## 2014-11-09 MED ORDER — MORPHINE SULFATE 2 MG/ML IJ SOLN
1.0000 mg | INTRAMUSCULAR | Status: DC | PRN
  Administered 2014-11-10: 1 mg via INTRAVENOUS
  Filled 2014-11-09: qty 1

## 2014-11-09 MED ORDER — METOCLOPRAMIDE HCL 10 MG PO TABS: 5.0000 mg | ORAL_TABLET | Freq: Three times a day (TID) | ORAL | Status: DC | PRN

## 2014-11-09 MED ORDER — HYDROMORPHONE HCL 1 MG/ML IJ SOLN
0.2500 mg | INTRAMUSCULAR | Status: DC | PRN
  Administered 2014-11-09 (×2): 0.5 mg via INTRAVENOUS

## 2014-11-09 MED ORDER — LACTATED RINGERS IV SOLN
INTRAVENOUS | Status: DC
  Administered 2014-11-09: 15:00:00 via INTRAVENOUS

## 2014-11-09 MED ORDER — ONDANSETRON HCL 4 MG/2ML IJ SOLN: 4.0000 mg | Freq: Four times a day (QID) | INTRAMUSCULAR | Status: DC | PRN

## 2014-11-09 MED ORDER — TIZANIDINE HCL 4 MG PO TABS
4.0000 mg | ORAL_TABLET | Freq: Three times a day (TID) | ORAL | Status: DC | PRN
  Filled 2014-11-09: qty 1

## 2014-11-09 MED ORDER — VANCOMYCIN HCL 1000 MG IV SOLR
1000.0000 mg | INTRAVENOUS | Status: DC | PRN
  Administered 2014-11-09: 1000 mg via INTRAVENOUS

## 2014-11-09 MED ORDER — VANCOMYCIN HCL IN DEXTROSE 1-5 GM/200ML-% IV SOLN
1000.0000 mg | Freq: Two times a day (BID) | INTRAVENOUS | Status: DC
  Administered 2014-11-10 – 2014-11-11 (×4): 1000 mg via INTRAVENOUS
  Filled 2014-11-09 (×6): qty 200

## 2014-11-09 MED ORDER — LACTATED RINGERS IV SOLN
INTRAVENOUS | Status: DC | PRN
  Administered 2014-11-09: 17:00:00 via INTRAVENOUS

## 2014-11-09 MED ORDER — METFORMIN HCL 500 MG PO TABS
500.0000 mg | ORAL_TABLET | Freq: Two times a day (BID) | ORAL | Status: DC
  Administered 2014-11-10 – 2014-11-12 (×5): 500 mg via ORAL
  Filled 2014-11-09 (×8): qty 1

## 2014-11-09 MED ORDER — IRBESARTAN 300 MG PO TABS
300.0000 mg | ORAL_TABLET | Freq: Every day | ORAL | Status: DC
  Administered 2014-11-09 – 2014-11-12 (×3): 300 mg via ORAL
  Filled 2014-11-09 (×4): qty 1

## 2014-11-09 MED ORDER — ONDANSETRON HCL 4 MG/2ML IJ SOLN
INTRAMUSCULAR | Status: AC
  Filled 2014-11-09: qty 2

## 2014-11-09 MED ORDER — VITAMIN D (ERGOCALCIFEROL) 1.25 MG (50000 UNIT) PO CAPS
50000.0000 [IU] | ORAL_CAPSULE | ORAL | Status: DC
Start: 2014-11-10 — End: 2014-11-12
  Administered 2014-11-10: 50000 [IU] via ORAL
  Filled 2014-11-09: qty 1

## 2014-11-09 MED ORDER — HYDROCODONE-ACETAMINOPHEN 10-325 MG PO TABS
1.0000 | ORAL_TABLET | ORAL | Status: DC | PRN
  Administered 2014-11-09: 1 via ORAL
  Administered 2014-11-10 – 2014-11-11 (×4): 2 via ORAL
  Filled 2014-11-09 (×2): qty 2
  Filled 2014-11-09: qty 1
  Filled 2014-11-09 (×2): qty 2

## 2014-11-09 MED ORDER — ROCURONIUM BROMIDE 50 MG/5ML IV SOLN
INTRAVENOUS | Status: AC
  Filled 2014-11-09: qty 1

## 2014-11-09 MED ORDER — ONDANSETRON HCL 4 MG/2ML IJ SOLN
INTRAMUSCULAR | Status: DC | PRN
  Administered 2014-11-09: 4 mg via INTRAVENOUS

## 2014-11-09 MED ORDER — INSULIN GLARGINE 100 UNIT/ML ~~LOC~~ SOLN
10.0000 [IU] | Freq: Every day | SUBCUTANEOUS | Status: DC
  Administered 2014-11-09 – 2014-11-11 (×3): 10 [IU] via SUBCUTANEOUS
  Filled 2014-11-09 (×4): qty 0.1

## 2014-11-09 MED ORDER — METHOCARBAMOL 1000 MG/10ML IJ SOLN
500.0000 mg | Freq: Four times a day (QID) | INTRAVENOUS | Status: DC | PRN
  Filled 2014-11-09: qty 5

## 2014-11-09 MED ORDER — HYDROMORPHONE HCL 1 MG/ML IJ SOLN
INTRAMUSCULAR | Status: AC
  Administered 2014-11-09: 0.5 mg via INTRAVENOUS
  Filled 2014-11-09: qty 1

## 2014-11-09 MED ORDER — METHOCARBAMOL 500 MG PO TABS
500.0000 mg | ORAL_TABLET | Freq: Four times a day (QID) | ORAL | Status: DC | PRN
  Administered 2014-11-09: 500 mg via ORAL
  Filled 2014-11-09: qty 1

## 2014-11-09 MED ORDER — METOCLOPRAMIDE HCL 5 MG/ML IJ SOLN: 5.0000 mg | Freq: Three times a day (TID) | INTRAMUSCULAR | Status: DC | PRN

## 2014-11-09 MED ORDER — ASPIRIN EC 325 MG PO TBEC
325.0000 mg | DELAYED_RELEASE_TABLET | Freq: Two times a day (BID) | ORAL | Status: DC
  Administered 2014-11-10 – 2014-11-12 (×5): 325 mg via ORAL
  Filled 2014-11-09 (×8): qty 1

## 2014-11-09 MED ORDER — LORAZEPAM 1 MG PO TABS: 1.0000 mg | ORAL_TABLET | Freq: Three times a day (TID) | ORAL | Status: DC | PRN

## 2014-11-09 MED ORDER — FERROUS SULFATE 325 (65 FE) MG PO TABS
325.0000 mg | ORAL_TABLET | Freq: Three times a day (TID) | ORAL | Status: DC
  Administered 2014-11-10 – 2014-11-12 (×8): 325 mg via ORAL
  Filled 2014-11-09 (×11): qty 1

## 2014-11-09 MED ORDER — OXYCODONE-ACETAMINOPHEN 5-325 MG PO TABS
1.0000 | ORAL_TABLET | ORAL | Status: DC | PRN
  Administered 2014-11-10: 2 via ORAL
  Filled 2014-11-09: qty 2

## 2014-11-09 MED ORDER — SUCCINYLCHOLINE CHLORIDE 20 MG/ML IJ SOLN
INTRAMUSCULAR | Status: DC | PRN
  Administered 2014-11-09: 120 mg via INTRAVENOUS

## 2014-11-09 MED ORDER — AMLODIPINE BESYLATE 10 MG PO TABS
10.0000 mg | ORAL_TABLET | Freq: Every morning | ORAL | Status: DC
  Administered 2014-11-10 – 2014-11-12 (×2): 10 mg via ORAL
  Filled 2014-11-09 (×3): qty 1

## 2014-11-09 MED ORDER — MIDAZOLAM HCL 2 MG/2ML IJ SOLN
INTRAMUSCULAR | Status: AC
  Filled 2014-11-09: qty 2

## 2014-11-09 MED ORDER — PROPOFOL 10 MG/ML IV BOLUS
INTRAVENOUS | Status: AC
  Filled 2014-11-09: qty 20

## 2014-11-09 MED ORDER — PROPOFOL 10 MG/ML IV BOLUS
INTRAVENOUS | Status: DC | PRN
  Administered 2014-11-09: 20 mg via INTRAVENOUS
  Administered 2014-11-09: 60 mg via INTRAVENOUS
  Administered 2014-11-09: 200 mg via INTRAVENOUS

## 2014-11-09 MED ORDER — PHENYLEPHRINE HCL 10 MG/ML IJ SOLN
INTRAMUSCULAR | Status: DC | PRN
  Administered 2014-11-09 (×2): 80 ug via INTRAVENOUS

## 2014-11-09 MED ORDER — VANCOMYCIN HCL IN DEXTROSE 1-5 GM/200ML-% IV SOLN
INTRAVENOUS | Status: AC
  Filled 2014-11-09: qty 200

## 2014-11-09 MED ORDER — VANCOMYCIN HCL IN DEXTROSE 750-5 MG/150ML-% IV SOLN: 750.0000 mg | Freq: Two times a day (BID) | INTRAVENOUS | Status: DC

## 2014-11-09 MED ORDER — MIDAZOLAM HCL 5 MG/5ML IJ SOLN
INTRAMUSCULAR | Status: DC | PRN
  Administered 2014-11-09: 1 mg via INTRAVENOUS
  Administered 2014-11-09: 2 mg via INTRAVENOUS

## 2014-11-09 SURGICAL SUPPLY — 55 items
BAG DECANTER FOR FLEXI CONT (MISCELLANEOUS) IMPLANT
COVER SURGICAL LIGHT HANDLE (MISCELLANEOUS) ×2 IMPLANT
DRAPE IMP U-DRAPE 54X76 (DRAPES) ×2 IMPLANT
DRAPE ORTHO SPLIT 77X108 STRL (DRAPES) ×4
DRAPE STERI IOBAN 125X83 (DRAPES) ×1 IMPLANT
DRAPE SURG ORHT 6 SPLT 77X108 (DRAPES) ×2 IMPLANT
DRAPE U-SHAPE 47X51 STRL (DRAPES) ×2 IMPLANT
DRSG ADAPTIC 3X8 NADH LF (GAUZE/BANDAGES/DRESSINGS) ×2 IMPLANT
DRSG AQUACEL AG ADV 3.5X10 (GAUZE/BANDAGES/DRESSINGS) ×1 IMPLANT
DRSG PAD ABDOMINAL 8X10 ST (GAUZE/BANDAGES/DRESSINGS) ×2 IMPLANT
DRSG TEGADERM 2-3/8X2-3/4 SM (GAUZE/BANDAGES/DRESSINGS) ×2 IMPLANT
DURAPREP 26ML APPLICATOR (WOUND CARE) ×2 IMPLANT
ELECT BLADE 4.0 EZ CLEAN MEGAD (MISCELLANEOUS) ×2
ELECT CAUTERY BLADE 6.4 (BLADE) IMPLANT
ELECT REM PT RETURN 9FT ADLT (ELECTROSURGICAL) ×2
ELECTRODE BLDE 4.0 EZ CLN MEGD (MISCELLANEOUS) IMPLANT
ELECTRODE REM PT RTRN 9FT ADLT (ELECTROSURGICAL) IMPLANT
EVACUATOR 1/8 PVC DRAIN (DRAIN) ×1 IMPLANT
FACESHIELD STD STERILE (MASK) ×1 IMPLANT
GAUZE SPONGE 4X4 12PLY STRL (GAUZE/BANDAGES/DRESSINGS) ×2 IMPLANT
GAUZE XEROFORM 1X8 LF (GAUZE/BANDAGES/DRESSINGS) ×1 IMPLANT
GLOVE BIO SURGEON STRL SZ8 (GLOVE) ×2 IMPLANT
GLOVE BIOGEL PI IND STRL 8 (GLOVE) ×1 IMPLANT
GLOVE BIOGEL PI INDICATOR 8 (GLOVE) ×1
GLOVE ORTHO TXT STRL SZ7.5 (GLOVE) ×2 IMPLANT
GOWN STRL REUS W/ TWL LRG LVL3 (GOWN DISPOSABLE) ×1 IMPLANT
GOWN STRL REUS W/ TWL XL LVL3 (GOWN DISPOSABLE) ×4 IMPLANT
GOWN STRL REUS W/TWL LRG LVL3 (GOWN DISPOSABLE) ×2
GOWN STRL REUS W/TWL XL LVL3 (GOWN DISPOSABLE) ×8
HANDPIECE INTERPULSE COAX TIP (DISPOSABLE) ×4
KIT BASIN OR (CUSTOM PROCEDURE TRAY) ×2 IMPLANT
KIT ROOM TURNOVER OR (KITS) ×2 IMPLANT
MANIFOLD NEPTUNE II (INSTRUMENTS) ×2 IMPLANT
NS IRRIG 1000ML POUR BTL (IV SOLUTION) ×2 IMPLANT
PACK TOTAL JOINT (CUSTOM PROCEDURE TRAY) ×2 IMPLANT
PACK UNIVERSAL I (CUSTOM PROCEDURE TRAY) ×2 IMPLANT
PAD ARMBOARD 7.5X6 YLW CONV (MISCELLANEOUS) ×4 IMPLANT
SET HNDPC FAN SPRY TIP SCT (DISPOSABLE) IMPLANT
SPONGE LAP 18X18 X RAY DECT (DISPOSABLE) ×2 IMPLANT
STAPLER VISISTAT 35W (STAPLE) ×2 IMPLANT
SUT ETHILON 2 0 FS 18 (SUTURE) ×2 IMPLANT
SUT VIC AB 0 CT1 27 (SUTURE) ×2
SUT VIC AB 0 CT1 27XBRD ANBCTR (SUTURE) IMPLANT
SUT VIC AB 1 CT1 27 (SUTURE) ×2
SUT VIC AB 1 CT1 27XBRD ANBCTR (SUTURE) IMPLANT
SUT VIC AB 1 CTB1 27 (SUTURE) ×4 IMPLANT
SUT VIC AB 2-0 CT1 27 (SUTURE) ×6
SUT VIC AB 2-0 CT1 TAPERPNT 27 (SUTURE) ×2 IMPLANT
SUT VICRYL 0 CT 1 36IN (SUTURE) ×2 IMPLANT
SWAB COLLECTION DEVICE MRSA (MISCELLANEOUS) ×2 IMPLANT
TOWEL OR 17X24 6PK STRL BLUE (TOWEL DISPOSABLE) ×2 IMPLANT
TOWEL OR 17X26 10 PK STRL BLUE (TOWEL DISPOSABLE) ×2 IMPLANT
TUBE ANAEROBIC SPECIMEN COL (MISCELLANEOUS) ×2 IMPLANT
UNDERPAD 30X30 INCONTINENT (UNDERPADS AND DIAPERS) ×2 IMPLANT
WATER STERILE IRR 1000ML POUR (IV SOLUTION) ×2 IMPLANT
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
## 2014-11-09 NOTE — Brief Op Note (Signed)
11/09/2014  7:03 PM  PATIENT:  Cynthia [name]  58 y.o. female  PRE-OPERATIVE DIAGNOSIS:  Right hip post op infection  POST-OPERATIVE DIAGNOSIS:  Right hip post op infection  PROCEDURE:  Procedure(s): IRRIGATION AND DEBRIDEMENT RIGHT HIP (Right)  SURGEON:  Surgeon(s) and Role:    * Kathryne Hitchhristopher Y Evania Lyne, MD - Primary  PHYSICIAN ASSISTANT: Rexene EdisonGil Clark, PA-C  ANESTHESIA:   general  EBL:   < 100 cc  DRAINS: (2 medium) Hemovact drain(s) in the deep and superficial tissues with  Suction Open   LOCAL MEDICATIONS USED:  NONE  SPECIMEN:  No Specimen  DISPOSITION OF SPECIMEN:  N/A  COUNTS:  YES  TOURNIQUET:  * No tourniquets in log *  DICTATIO: [id number]: [id number]  PLAN OF CARE: Admit to inpatient   PATIENT DISPOSITION:  PACU - hemodynamically stable.   Delay start of Pharmacological VTE agent (>24hrs) due to surgical blood loss or risk of bleeding: no

## 2014-11-09 NOTE — Transfer of Care (Signed)
Immediate Anesthesia Transfer of Care Note  Patient: Cynthia [name]  Procedure(s) Performed: Procedure(s): IRRIGATION AND DEBRIDEMENT RIGHT HIP (Right)  Patient Location: PACU  Anesthesia Type:General  Level of Consciousness: oriented, sedated, patient cooperative and responds to stimulation  Airway & Oxygen Therapy: Patient Spontanous Breathing and Patient connected to nasal cannula oxygen  Post-op Assessment: Report given to PACU RN, Post -op Vital signs reviewed and stable and Patient moving all extremities X 4  Post vital signs: Reviewed and stable  Complications: No apparent anesthesia complications

## 2014-11-09 NOTE — Anesthesia Postprocedure Evaluation (Signed)
  Anesthesia Post-op Note  Patient: Cynthia [name]  Procedure(s) Performed: Procedure(s): IRRIGATION AND DEBRIDEMENT RIGHT HIP (Right)  Patient Location: PACU  Anesthesia Type:General  Level of Consciousness: awake and alert   Airway and Oxygen Therapy: Patient Spontanous Breathing  Post-op Pain: mild  Post-op Assessment: Post-op Vital signs reviewed  Post-op Vital Signs: stable  Last Vitals:  Filed Vitals:   11/09/14 2010  BP:   Pulse:   Temp: 36.9 C  Resp:     Complications: No apparent anesthesia complications

## 2014-11-09 NOTE — Anesthesia Preprocedure Evaluation (Addendum)
Anesthesia Evaluation  Patient identified by MRN, date of birth, ID band Patient awake    Reviewed: Allergy & Precautions, H&P , NPO status , Patient's Chart, lab work & pertinent test results  Airway Mallampati: II   Neck ROM: full    Dental   Pulmonary sleep apnea , former smoker,          Cardiovascular hypertension, + Peripheral Vascular Disease and +CHF + Valvular Problems/Murmurs MR     Neuro/Psych  Headaches, Anxiety  Neuromuscular disease    GI/Hepatic   Endo/Other  diabetes, Type obesity  Renal/GU      Musculoskeletal  (+) Arthritis -, Fibromyalgia -  Abdominal   Peds  Hematology   Anesthesia Other Findings   Reproductive/Obstetrics                            Anesthesia Physical Anesthesia Plan  ASA: III  Anesthesia Plan: General   Post-op Pain Management:    Induction: Intravenous  Airway Management Planned: Oral ETT  Additional Equipment:   Intra-op Plan:   Post-operative Plan: Extubation in OR  Informed Consent: I have reviewed the patients History and Physical, chart, labs and discussed the procedure including the risks, benefits and alternatives for the proposed anesthesia with the patient or authorized representative who has indicated his/her understanding and acceptance.   Dental advisory given  Plan Discussed with: CRNA, Anesthesiologist and Surgeon  Anesthesia Plan Comments:        Anesthesia Quick Evaluation

## 2014-11-10 ENCOUNTER — Encounter (HOSPITAL_COMMUNITY): Payer: Self-pay | Admitting: Orthopaedic Surgery

## 2014-11-10 LAB — BASIC METABOLIC PANEL
Anion gap: 6 (ref 5–15)
BUN: 9 mg/dL (ref 6–23)
CHLORIDE: 102 meq/L (ref 96–112)
CO2: 27 mmol/L (ref 19–32)
CREATININE: 0.83 mg/dL (ref 0.50–1.10)
Calcium: 8.5 mg/dL (ref 8.4–10.5)
GFR calc non Af Amer: 76 mL/min — ABNORMAL LOW (ref >90)
GFR, EST AFRICAN AMERICAN: 88 mL/min — AB (ref >90)
GLUCOSE: 127 mg/dL — AB (ref 70–99)
POTASSIUM: 3.9 mmol/L (ref 3.5–5.1)
Sodium: 135 mmol/L (ref 135–145)

## 2014-11-10 LAB — GLUCOSE, CAPILLARY
GLUCOSE-CAPILLARY: 112 mg/dL — AB (ref 70–99)
GLUCOSE-CAPILLARY: 159 mg/dL — AB (ref 70–99)
Glucose-Capillary: 145 mg/dL — ABNORMAL HIGH (ref 70–99)
Glucose-Capillary: 96 mg/dL (ref 70–99)

## 2014-11-10 LAB — C-REACTIVE PROTEIN: CRP: 2.5 mg/dL — ABNORMAL HIGH (ref <0.60)

## 2014-11-10 NOTE — Progress Notes (Signed)
PT Cancellation Note  Patient Details Name: Cynthia [name] MRN: [id number] DOB: [birth year]   Cancelled Treatment:    Reason Eval/Treat Not Completed: Patient declined.  She is confident in her mobility and does not want to be billed for PT evaluation.  She is open to OP PT to work on walking with a cane after discharge from acute hospital setting.  I will defer this decision to Dr. Magnus IvanBlackman as to if she is ready.  PT to sign off.  Thanks,    Rollene Rotundaebecca B. Jaunita Mikels, PT, DPT [phone]   11/10/2014, 12:21 PM

## 2014-11-10 NOTE — Progress Notes (Signed)
Pt stating she has pain and nausea. States she does not want any medications for treatment, and was selective on medications she did receive tonight. RN offers other interventions that pt refuses such as cool cloth, ice, ginger ale. Pt requests up to restroom and was assisted by this RN. Once pt in restroom she had emesis episode and then states she would like med for nausea. This RN got medication and went to room and then pt refuses medication and states she feels a little bit better now that she has had emesis. Pt then requests sleeping aid and RN states she will call Dr for medication. Pt stating she wants anything but ambien. Then very shortly after pt states she does not want a sleep aid. Pt currently sitting up resting in bed, talking to husband on phone.

## 2014-11-10 NOTE — Op Note (Signed)
NAMMonica Martinez:  Montavon, Phelan                  ACCOUNT NO.:  [id number]  MEDICAL RECORD NO.:  [id number]  LOCATION:  [id number]                        FACILITY:  [facility]  PHYSICIAN:  Vanita PandaChristopher Y. Magnus IvanBlackman, M.D.DATE OF BIRTH:  [birth year]  DATE OF PROCEDURE:  11/09/2014 DATE OF DISCHARGE:                              OPERATIVE REPORT   PREOPERATIVE DIAGNOSIS:  Suspected right hip wound, perioperative infection status post total hip arthroplasty.  POSTOPERATIVE DIAGNOSIS:  Suspected right hip wound, perioperative infection status post total hip arthroplasty.  PROCEDURE:  Irrigation and debridement of right hip superficial and deep tissue including irrigation and debridement of right hip joint and arthrotomy.  FINDINGS: 1. Large seroma, right hip superficial tissue, superficial to the     tensor fascia. 2. Deep seroma around the right hip joint.  Gram stain and cultures,     superficial and deep, pending.  SURGEON:  Vanita PandaChristopher Y. Magnus IvanBlackman, M.D.  ASSISTANT:  Richardean CanalGilbert Clark, PA-C  ANESTHESIA:  General.  BLOOD LOSS:  Less than 100 mL.  ANTIBIOTICS:  1 g IV vancomycin after cultures obtained.  INDICATIONS:  Ms. Cynthia [name] is a very pleasant 58 year old female with diabetes and morbid obesity, who underwent successful right total hip arthroplasty just over 2 weeks ago.  Her postoperative course was uneventful and she went home without any issues.  She had her 1st routine follow up in the office today just over 2 weeks out from surgery.  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  She did not have a fever and her vital signs were stable.  When I took the staples out in the exam room, I noticed some purulent areas around the incision, but no significant redness.  I could express some purulence, and I felt given the fact that she is diabetic and her obesity that she would best benefit washing out  her tissues both superficial and deep and obtaining good cultures to avoid any deep infection, hopefully.  I explained this to her in detail, and she did wish to proceed with surgery.  Of note, preoperatively her white blood cell count was only 5000.  Her previous nasal swabs before her original elective total hip arthroplasty was positive for Staph and negative for MRSA.  She was treated appropriately with mupirocin ointment 5 days prior to surgery and given appropriate antibiotics at the time of surgery.  PROCEDURE IN DETAIL:  After informed consent obtained, appropriate right hip was marked.  She was brought to the operating room.  General anesthesia was obtained while she was on her stretcher.  Next, she was placed supine on the Hana table with both feet in inline with skeletal traction boots, but no traction applied.  Perineal post was placed as well.  Her right operative hip was then prepped and draped with DuraPrep and sterile drapes.  A time-out was called to identify correct patient, correct right hip.  I then opened up for incision through her previous incision and found just minimal gross purulence around her skin and superficial tissue.  We obtained cultures all the way down to the  tensor fascia lata where I did find a large seroma and some necrotic adipose tissue.  We then used a rongeur to remove some necrotic adipose tissue, and I found no opening to the hip joint to the tensor fascia that was closed.  I then irrigated skin and soft tissue, superficial and deep, but superficial to the IT band with 3 L of normal saline solution using pulsatile lavage.  We then switched out our pulsatile lavage, our gloves and new instruments.  The cultures were also obtained from this superficial tissues.  We then opened up the iliotibial band and I did not find any gross purulence in the hip joint, but I did found a large fluid collection around there which we suctioned out, and I  obtained deep cultures around the hip joint as well.  We then distracted the hip a little bit to help separate the hip ball from the socket itself minimally and I  irrigated with another 3 L of normal saline solution around the hip joint itself.  I then placed a medium Hemovac deep around the arthrotomy and a medium Hemovac drain superficial to the tensor fascia.  I closed the tensor fascia again with interrupted #1 Vicryl suture followed by some loose Vicryl in the deep tissue, 2-0 Vicryl in subcutaneous tissue, interrupted staples on the skin.  An Aquacel dressing was applied.  She was taken off the Hana table, awakened, extubated, and taken to recovery room in stable condition.  All final counts were correct.  There were no complications noted. Postoperatively, we will keep her on IV antibiotics until cultures obtained and I suspect we may still treat her with 6 weeks of antibiotics IV pending our culture findings.  She will be admitted as an inpatient pending her culture results and continued on IV antibiotics until an organism is potentially identified.  Of note, Richardean CanalGilbert Clark, P.A-C., assisted during the entire case and his assistance was crucial for facilitating the case.     Vanita Pandahristopher Y. Magnus IvanBlackman, M.D.     CYB/MEDQ  D:  11/09/2014  T:  11/10/2014  Job:  [id number]

## 2014-11-10 NOTE — Progress Notes (Signed)
Subjective: 1 Day Post-Op Procedure(s) (LRB): IRRIGATION AND DEBRIDEMENT RIGHT HIP (Right) Patient reports pain as moderate.  Gram stain and cultures not back yet.  Hemovac drains in place.  Objective: Vital signs in last 24 hours: Temp:  [98 F (36.7 C)-98.7 F (37.1 C)] 98.4 F (36.9 C) (12/29 0600) Pulse Rate:  [84-98] 85 (12/29 0600) Resp:  [16-25] 16 (12/29 0600) BP: (95-155)/(57-90) 97/57 mmHg (12/29 0600) SpO2:  [93 %-100 %] 95 % (12/29 0600) Weight:  [107.049 kg (236 lb)] 107.049 kg (236 lb) (12/28 1434)  Intake/Output from previous day: 12/28 0701 - 12/29 0700 In: 1099.2 [I.V.:1099.2] Out: 650 [Urine:450; Drains:200] Intake/Output this shift:     Recent Labs  11/09/14 1431  HGB 10.4*    Recent Labs  11/09/14 1431  WBC 5.2  RBC 3.98  HCT 33.6*  PLT 417*    Recent Labs  11/09/14 1431 11/10/14 0500  NA 137 135  K 4.0 3.9  CL 102 102  CO2 25 27  BUN 12 9  CREATININE 0.97 0.83  GLUCOSE 170* 127*  CALCIUM 9.6 8.5   No results for input(s): LABPT, INR in the last 72 hours.  Sensation intact distally Intact pulses distally Dorsiflexion/Plantar flexion intact Incision: scant drainage No cellulitis present Compartment soft  Assessment/Plan: 1 Day Post-Op Procedure(s) (LRB): IRRIGATION AND DEBRIDEMENT RIGHT HIP (Right) Up with therapy  Await culture results. Likely PICC line. Leave drains in today.  Kaja Jackowski Y 11/10/2014, 7:43 AM

## 2014-11-11 LAB — BASIC METABOLIC PANEL
ANION GAP: 6 (ref 5–15)
BUN: 9 mg/dL (ref 6–23)
CHLORIDE: 104 meq/L (ref 96–112)
CO2: 27 mmol/L (ref 19–32)
Calcium: 8.8 mg/dL (ref 8.4–10.5)
Creatinine, Ser: 0.91 mg/dL (ref 0.50–1.10)
GFR calc Af Amer: 78 mL/min — ABNORMAL LOW (ref >90)
GFR calc non Af Amer: 68 mL/min — ABNORMAL LOW (ref >90)
Glucose, Bld: 117 mg/dL — ABNORMAL HIGH (ref 70–99)
POTASSIUM: 4 mmol/L (ref 3.5–5.1)
SODIUM: 137 mmol/L (ref 135–145)

## 2014-11-11 LAB — GLUCOSE, CAPILLARY
GLUCOSE-CAPILLARY: 94 mg/dL (ref 70–99)
GLUCOSE-CAPILLARY: 144 mg/dL — AB (ref 70–99)
Glucose-Capillary: 105 mg/dL — ABNORMAL HIGH (ref 70–99)
Glucose-Capillary: 115 mg/dL — ABNORMAL HIGH (ref 70–99)

## 2014-11-11 MED ORDER — TEMAZEPAM 15 MG PO CAPS
15.0000 mg | ORAL_CAPSULE | Freq: Every evening | ORAL | Status: DC | PRN
  Administered 2014-11-11: 15 mg via ORAL
  Filled 2014-11-11: qty 1

## 2014-11-11 NOTE — Progress Notes (Signed)
Patient ID: Cynthia [name], female   DOB: 05/25/1956, 58 y.o.   MRN: [id number] [facility]ter[facility] well overall.  Awaiting final culture results to determine type and duration of antibiotics.  Negative thus far.  Incision looks good.  Drains removed.

## 2014-11-12 ENCOUNTER — Encounter (HOSPITAL_COMMUNITY): Payer: Self-pay | Admitting: General Practice

## 2014-11-12 LAB — BASIC METABOLIC PANEL
Anion gap: 5 (ref 5–15)
BUN: 8 mg/dL (ref 6–23)
CO2: 27 mmol/L (ref 19–32)
CREATININE: 0.82 mg/dL (ref 0.50–1.10)
Calcium: 9 mg/dL (ref 8.4–10.5)
Chloride: 108 mEq/L (ref 96–112)
GFR calc Af Amer: 89 mL/min — ABNORMAL LOW (ref >90)
GFR calc non Af Amer: 77 mL/min — ABNORMAL LOW (ref >90)
GLUCOSE: 131 mg/dL — AB (ref 70–99)
POTASSIUM: 3.7 mmol/L (ref 3.5–5.1)
Sodium: 140 mmol/L (ref 135–145)

## 2014-11-12 LAB — WOUND CULTURE: CULTURE: NO GROWTH

## 2014-11-12 LAB — GLUCOSE, CAPILLARY: Glucose-Capillary: 99 mg/dL (ref 70–99)

## 2014-11-12 LAB — VANCOMYCIN, TROUGH: VANCOMYCIN TR: 17.9 ug/mL (ref 10.0–20.0)

## 2014-11-12 MED ORDER — DOXYCYCLINE HYCLATE 100 MG PO TABS: 100.0000 mg | ORAL_TABLET | Freq: Two times a day (BID) | ORAL | Status: DC

## 2014-11-12 MED ORDER — HYDROCODONE-ACETAMINOPHEN 10-325 MG PO TABS: 1.0000 | ORAL_TABLET | ORAL | Status: DC | PRN

## 2014-11-12 NOTE — Progress Notes (Signed)
Subjective: 3 Days Post-Op Procedure(s) (LRB): IRRIGATION AND DEBRIDEMENT RIGHT HIP (Right) Patient reports pain as mild.  Doing well.  Gram stain and cultures negative thus far.  Objective: Vital signs in last 24 hours: Temp:  [98.1 F (36.7 C)-98.6 F (37 C)] 98.1 F (36.7 C) (12/31 0503) Pulse Rate:  [80-94] 80 (12/31 0503) Resp:  [16-18] 16 (12/31 0503) BP: (117-133)/(64-78) 117/64 mmHg (12/31 0503) SpO2:  [95 %-100 %] 100 % (12/31 0503)  Intake/Output from previous day: 12/30 0701 - 12/31 0700 In: 600 [P.O.:600] Out: -  Intake/Output this shift:     Recent Labs  11/09/14 1431  HGB 10.4*    Recent Labs  11/09/14 1431  WBC 5.2  RBC 3.98  HCT 33.6*  PLT 417*    Recent Labs  11/10/14 0500 11/11/14 0610  NA 135 137  K 3.9 4.0  CL 102 104  CO2 27 27  BUN 9 9  CREATININE 0.83 0.91  GLUCOSE 127* 117*  CALCIUM 8.5 8.8   No results for input(s): LABPT, INR in the last 72 hours.  Sensation intact distally Intact pulses distally Dorsiflexion/Plantar flexion intact Incision: no drainage No cellulitis present Compartment soft  Assessment/Plan: 3 Days Post-Op Procedure(s) (LRB): IRRIGATION AND DEBRIDEMENT RIGHT HIP (Right) Discharge to home today on oral antibiotics. No organisms isolated thus far.  Fishel Wamble Y 11/12/2014, 6:28 AM

## 2014-11-12 NOTE — Progress Notes (Signed)
CARE MANAGEMENT NOTE 11/12/2014  Patient:  Cynthia [name],Cynthia [name]   Account Number:  [id number]  Date Initiated:  11/11/2014  Documentation [facility]:  [facility]Lebert Lovern  Subjective/Objective Assessment:   wound infection, recent rt hip arthroplasty     Action/Plan:   PT eval- patient refused, had recent PT with [facility]   Anticipated DC Date:  11/12/2014   Anticipated DC Plan:  HOME/SELF CARE      DC Planning Services  CM consult      Choice offered to / List presented to:             Status of service:  Completed, signed off Medicare Important Message given?   (If response is "NO", the following Medicare IM given date fields will be blank) Date Medicare IM given:   Medicare IM given by:   Date Additional Medicare IM given:   Additional Medicare IM given by:    Discharge Disposition:  HOME/SELF CARE  Per UR Regulation:  Reviewed for med. necessity/level of care/duration of stay  If discussed at Long Length of Stay Meetings, dates discussed:    Comments:

## 2014-11-12 NOTE — Discharge Instructions (Signed)
You can get your current hip dressing wet daily in the shower. Keep your dressing on until your outpatient follow-up.

## 2014-11-12 NOTE — Progress Notes (Signed)
ANTIBIOTIC CONSULT NOTE - FOLLOW UP  Pharmacy Consult for Vancomycin Indication: R THR infection  Allergies  Allergen Reactions  . Ambien [Zolpidem Tartrate] Other (See Comments)    Sleep walks  . Clonidine Derivatives Other (See Comments)    Per pt: unknown  . Other     NO BLOOD PRODUCTS  . Percocet [Oxycodone-Acetaminophen] Itching    Patient Measurements: Weight: 236 lb (107.049 kg) Adjusted Body Weight:   Vital Signs: Temp: 98.1 F (36.7 C) (12/31 0503) Temp Source: Oral (12/31 0503) BP: 117/64 mmHg (12/31 0503) Pulse Rate: 80 (12/31 0503) Intake/Output from previous day: 12/30 0701 - 12/31 0700 In: 600 [P.O.:600] Out: -  Intake/Output from this shift:    Labs:  Recent Labs  11/09/14 1431 11/10/14 0500 11/11/14 0610 11/12/14 0526  WBC 5.2  --   --   --   HGB 10.4*  --   --   --   PLT 417*  --   --   --   CREATININE 0.97 0.83 0.91 0.82   Estimated Creatinine Clearance: 89.8 mL/min (by C-G formula based on Cr of 0.82).  Recent Labs  11/12/14 0852  VANCOTROUGH 17.9     Microbiology: Recent Results (from the past 720 hour(s))  Surgical pcr screen     Status: Abnormal   Collection Time: 10/16/14 12:30 PM  Result Value Ref Range Status   MRSA, PCR NEGATIVE NEGATIVE Final   Staphylococcus aureus POSITIVE (A) NEGATIVE Final    Comment:        The Xpert SA Assay (FDA approved for NASAL specimens in patients over 59 years of age), is one component of a comprehensive surveillance program.  Test performance has been validated by [facility] for patients greater than or equal to 143 year old. It is not intended to diagnose infection nor to guide or monitor treatment.   Anaerobic culture     Status: None (Preliminary result)   Collection Time: 11/09/14  6:41 PM  Result Value Ref Range Status   Specimen Description WOUND HIP RIGHT  Final   Special Requests SUPERFICIAL WOUND  Final   Gram Stain PENDING  Incomplete   Culture   Final    NO ANAEROBES  ISOLATED; CULTURE IN PROGRESS FOR 5 DAYS Performed at [facility]    Report Status PENDING  Incomplete  Anaerobic culture     Status: None (Preliminary result)   Collection Time: 11/09/14  6:41 PM  Result Value Ref Range Status   Specimen Description WOUND HIP RIGHT  Final   Special Requests DEEP  Final   Gram Stain PENDING  Incomplete   Culture   Final    NO ANAEROBES ISOLATED; CULTURE IN PROGRESS FOR 5 DAYS Performed at [facility]    Report Status PENDING  Incomplete  Wound culture     Status: None (Preliminary result)   Collection Time: 11/09/14  6:41 PM  Result Value Ref Range Status   Specimen Description WOUND HIP RIGHT  Final   Special Requests SUPERFICIAL  Final   Gram Stain   Final    NO WBC SEEN NO SQUAMOUS EPITHELIAL CELLS SEEN NO ORGANISMS SEEN Performed at [facility]    Culture   Final    Culture reincubated for better growth Performed at [facility]    Report Status PENDING  Incomplete  Wound culture     Status: None   Collection Time: 11/09/14  6:41 PM  Result Value Ref Range Status   Specimen  Description WOUND HIP RIGHT  Final   Special Requests DEEP  Final   Gram Stain   Final    RARE WBC PRESENT, PREDOMINANTLY PMN NO SQUAMOUS EPITHELIAL CELLS SEEN NO ORGANISMS SEEN Performed at [facility]    Culture   Final    NO GROWTH 2 DAYS Performed at [facility]    Report Status 11/12/2014 FINAL  Final    Anti-infectives    Start     Dose/Rate Route Frequency Ordered Stop   11/12/14 0000  doxycycline (VIBRA-TABS) 100 MG tablet     100 mg Oral 2 times daily 11/12/14 0632     11/10/14 1000  vancomycin (VANCOCIN) IVPB 750 mg/150 ml premix  Status:  Discontinued     750 mg150 mL/hr over 60 Minutes Intravenous Every 12 hours 11/09/14 2048 11/09/14 2053   11/10/14 1000  vancomycin (VANCOCIN) IVPB 1000 mg/200 mL premix     1,000 mg200 mL/hr over 60 Minutes Intravenous Every 12 hours 11/09/14 2053      11/09/14 2130  vancomycin (VANCOCIN) 1,750 mg in sodium chloride 0.9 % 500 mL IVPB     1,750 mg250 mL/hr over 120 Minutes Intravenous  Once 11/09/14 2048 11/10/14 0009      Assessment: 58 yo admitted for post op infection of recent THR. During the removal of the staples there was some purulent drainage. She was admitted for I&D of hip.  Anticoagulation: SCDs  Infectious Disease: Infected hip s/p R THR. S/p I&D 12/28. Goal 15-20.   12/28 vanc>> - 12/31: VT 17.9 ok  12/28 R hip superficial wound>> pending 12/28 R hip deep wound>>negative  Cardiovascular: HTN, CHF. VSS. Amlodipine10, ASA 325, lasix, irbesartan, labetalol, statin  Endocrinology: DM. Glipizide, metformin, lantus. CBGs<150  Gastrointestinal / Nutrition: HH/CM diet  Neurology: Fibromyalgia.  Nephrology: Scr stable, lytes ok  Pulmonary: RA  Hematology / Oncology: 12/28 CBC ok at baseline  PTA Medication Issues: not resumed - duloxetine  Best Practices: SCDs  Goal of Therapy:  Vancomycin trough level 15-20 mcg/ml  Plan:  Vancomycin 1g IV q12 hrs. Dose ok. Plan discharge on Doxy   Mendel Binsfeld S. Merilynn Finlandobertson, PharmD, BCPS Clinical Staff Pharmacist Pager [phone]  Pasty Spillersobertson, Alexandrina Fiorini Stillinger 11/12/2014,12:37 PM

## 2014-11-12 NOTE — Progress Notes (Signed)
Discharge summary sent to payer through [facility]  
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
## 2014-11-15 LAB — ANAEROBIC CULTURE: Gram Stain: NONE SEEN

## 2014-11-15 LAB — WOUND CULTURE: GRAM STAIN: NONE SEEN

## 2014-11-16 LAB — GLUCOSE, CAPILLARY: Glucose-Capillary: 117 mg/dL — ABNORMAL HIGH (ref 70–99)

## 2014-11-17 ENCOUNTER — Encounter: Payer: Self-pay | Admitting: Vascular Surgery

## 2014-11-27 ENCOUNTER — Encounter (HOSPITAL_COMMUNITY): Payer: Self-pay | Admitting: Orthopaedic Surgery

## 2014-11-30 ENCOUNTER — Ambulatory Visit: Payer: 59 | Admitting: Internal Medicine

## 2014-12-14 ENCOUNTER — Ambulatory Visit (HOSPITAL_COMMUNITY)
Admission: RE | Admit: 2014-12-14 | Discharge: 2014-12-14 | Disposition: A | Discharge status: Home / Self Care | Payer: 59 | Source: Ambulatory Visit | Attending: Orthopaedic Surgery | Admitting: Orthopaedic Surgery

## 2014-12-14 ENCOUNTER — Other Ambulatory Visit (HOSPITAL_COMMUNITY): Payer: Self-pay | Admitting: Orthopaedic Surgery

## 2014-12-14 DIAGNOSIS — M7989 Other specified soft tissue disorders: Secondary | ICD-10-CM | POA: Diagnosis present

## 2014-12-14 DIAGNOSIS — M79604 Pain in right leg: Secondary | ICD-10-CM

## 2014-12-14 DIAGNOSIS — R7989 Other specified abnormal findings of blood chemistry: Secondary | ICD-10-CM

## 2014-12-14 NOTE — Progress Notes (Signed)
VASCULAR LAB PRELIMINARY  PRELIMINARY  PRELIMINARY  PRELIMINARY  Bilateral lower extremity venous duplex  completed.    Preliminary report:  Right:  DVT noted in the distal PTV from ankle to almost mid calf.  No evidence of superficial thrombosis.  No Baker's cyst.  Left:  No evidence of DVT, superficial thrombosis, or Baker's cyst.  Camarie Mctigue, RVT 12/14/2014, 4:01 PM

## 2014-12-21 ENCOUNTER — Other Ambulatory Visit: Payer: Self-pay | Admitting: Family Medicine

## 2014-12-21 ENCOUNTER — Ambulatory Visit
Admission: RE | Admit: 2014-12-21 | Discharge: 2014-12-21 | Disposition: A | Discharge status: Home / Self Care | Payer: 59 | Source: Ambulatory Visit | Attending: Family Medicine | Admitting: Family Medicine

## 2014-12-21 DIAGNOSIS — R0602 Shortness of breath: Secondary | ICD-10-CM

## 2014-12-21 DIAGNOSIS — I82401 Acute embolism and thrombosis of unspecified deep veins of right lower extremity: Secondary | ICD-10-CM

## 2015-02-25 ENCOUNTER — Ambulatory Visit
Admission: RE | Admit: 2015-02-25 | Discharge: 2015-02-25 | Disposition: A | Discharge status: Home / Self Care | Payer: 59 | Source: Ambulatory Visit | Attending: Family Medicine | Admitting: Family Medicine

## 2015-02-25 ENCOUNTER — Other Ambulatory Visit: Payer: Self-pay | Admitting: Family Medicine

## 2015-02-25 DIAGNOSIS — R609 Edema, unspecified: Secondary | ICD-10-CM

## 2015-02-25 DIAGNOSIS — R2242 Localized swelling, mass and lump, left lower limb: Secondary | ICD-10-CM

## 2015-02-25 DIAGNOSIS — I82402 Acute embolism and thrombosis of unspecified deep veins of left lower extremity: Secondary | ICD-10-CM

## 2015-03-12 ENCOUNTER — Emergency Department (HOSPITAL_COMMUNITY): Payer: 59

## 2015-03-12 ENCOUNTER — Emergency Department (HOSPITAL_COMMUNITY)
Admission: EM | Admit: 2015-03-12 | Discharge: 2015-03-12 | Disposition: A | Discharge status: Home / Self Care | Payer: 59 | Attending: Emergency Medicine | Admitting: Emergency Medicine

## 2015-03-12 ENCOUNTER — Encounter (HOSPITAL_COMMUNITY): Payer: Self-pay

## 2015-03-12 ENCOUNTER — Encounter (HOSPITAL_COMMUNITY): Payer: Self-pay | Admitting: *Deleted

## 2015-03-12 ENCOUNTER — Emergency Department (INDEPENDENT_AMBULATORY_CARE_PROVIDER_SITE_OTHER)
Admission: EM | Admit: 2015-03-12 | Discharge: 2015-03-12 | Disposition: A | Discharge status: Other Acute Inpatient Hospital | Payer: 59 | Source: Home / Self Care | Attending: Family Medicine | Admitting: Family Medicine

## 2015-03-12 DIAGNOSIS — Z79899 Other long term (current) drug therapy: Secondary | ICD-10-CM | POA: Diagnosis not present

## 2015-03-12 DIAGNOSIS — Z792 Long term (current) use of antibiotics: Secondary | ICD-10-CM | POA: Diagnosis not present

## 2015-03-12 DIAGNOSIS — S161XXA Strain of muscle, fascia and tendon at neck level, initial encounter: Secondary | ICD-10-CM

## 2015-03-12 DIAGNOSIS — Z87891 Personal history of nicotine dependence: Secondary | ICD-10-CM | POA: Diagnosis not present

## 2015-03-12 DIAGNOSIS — Z87828 Personal history of other (healed) physical injury and trauma: Secondary | ICD-10-CM | POA: Diagnosis not present

## 2015-03-12 DIAGNOSIS — Z7901 Long term (current) use of anticoagulants: Secondary | ICD-10-CM | POA: Insufficient documentation

## 2015-03-12 DIAGNOSIS — Y999 Unspecified external cause status: Secondary | ICD-10-CM | POA: Diagnosis not present

## 2015-03-12 DIAGNOSIS — Y939 Activity, unspecified: Secondary | ICD-10-CM | POA: Diagnosis not present

## 2015-03-12 DIAGNOSIS — E119 Type 2 diabetes mellitus without complications: Secondary | ICD-10-CM | POA: Diagnosis not present

## 2015-03-12 DIAGNOSIS — M1990 Unspecified osteoarthritis, unspecified site: Secondary | ICD-10-CM | POA: Diagnosis not present

## 2015-03-12 DIAGNOSIS — Z8701 Personal history of pneumonia (recurrent): Secondary | ICD-10-CM | POA: Diagnosis not present

## 2015-03-12 DIAGNOSIS — S199XXA Unspecified injury of neck, initial encounter: Secondary | ICD-10-CM | POA: Diagnosis present

## 2015-03-12 DIAGNOSIS — I509 Heart failure, unspecified: Secondary | ICD-10-CM | POA: Insufficient documentation

## 2015-03-12 DIAGNOSIS — F419 Anxiety disorder, unspecified: Secondary | ICD-10-CM | POA: Insufficient documentation

## 2015-03-12 DIAGNOSIS — X58XXXA Exposure to other specified factors, initial encounter: Secondary | ICD-10-CM | POA: Insufficient documentation

## 2015-03-12 DIAGNOSIS — Z8669 Personal history of other diseases of the nervous system and sense organs: Secondary | ICD-10-CM | POA: Insufficient documentation

## 2015-03-12 DIAGNOSIS — M797 Fibromyalgia: Secondary | ICD-10-CM | POA: Insufficient documentation

## 2015-03-12 DIAGNOSIS — I10 Essential (primary) hypertension: Secondary | ICD-10-CM | POA: Diagnosis not present

## 2015-03-12 DIAGNOSIS — R079 Chest pain, unspecified: Secondary | ICD-10-CM | POA: Diagnosis not present

## 2015-03-12 DIAGNOSIS — Y929 Unspecified place or not applicable: Secondary | ICD-10-CM | POA: Insufficient documentation

## 2015-03-12 DIAGNOSIS — Z794 Long term (current) use of insulin: Secondary | ICD-10-CM | POA: Diagnosis not present

## 2015-03-12 LAB — URINALYSIS, ROUTINE W REFLEX MICROSCOPIC
Bilirubin Urine: NEGATIVE
Glucose, UA: NEGATIVE mg/dL
Hgb urine dipstick: NEGATIVE
Ketones, ur: NEGATIVE mg/dL
Leukocytes, UA: NEGATIVE
Nitrite: NEGATIVE
Protein, ur: 100 mg/dL — AB
Specific Gravity, Urine: 1.027 (ref 1.005–1.030)
Urobilinogen, UA: 0.2 mg/dL (ref 0.0–1.0)
pH: 6 (ref 5.0–8.0)

## 2015-03-12 LAB — CBC
HCT: 36.3 % (ref 36.0–46.0)
HEMOGLOBIN: 11.4 g/dL — AB (ref 12.0–15.0)
MCH: 25.8 pg — ABNORMAL LOW (ref 26.0–34.0)
MCHC: 31.4 g/dL (ref 30.0–36.0)
MCV: 82.1 fL (ref 78.0–100.0)
Platelets: 288 10*3/uL (ref 150–400)
RBC: 4.42 MIL/uL (ref 3.87–5.11)
RDW: 16.6 % — AB (ref 11.5–15.5)
WBC: 3.4 10*3/uL — AB (ref 4.0–10.5)

## 2015-03-12 LAB — COMPREHENSIVE METABOLIC PANEL
ALK PHOS: 69 U/L (ref 39–117)
ALT: 19 U/L (ref 0–35)
ANION GAP: 9 (ref 5–15)
AST: 22 U/L (ref 0–37)
Albumin: 3.9 g/dL (ref 3.5–5.2)
BUN: 12 mg/dL (ref 6–23)
CHLORIDE: 105 mmol/L (ref 96–112)
CO2: 26 mmol/L (ref 19–32)
Calcium: 9.5 mg/dL (ref 8.4–10.5)
Creatinine, Ser: 1.25 mg/dL — ABNORMAL HIGH (ref 0.50–1.10)
GFR calc non Af Amer: 46 mL/min — ABNORMAL LOW (ref >90)
GFR, EST AFRICAN AMERICAN: 53 mL/min — AB (ref >90)
GLUCOSE: 122 mg/dL — AB (ref 70–99)
Potassium: 3.9 mmol/L (ref 3.5–5.1)
SODIUM: 140 mmol/L (ref 135–145)
Total Bilirubin: 0.4 mg/dL (ref 0.3–1.2)
Total Protein: 7.2 g/dL (ref 6.0–8.3)

## 2015-03-12 LAB — URINE MICROSCOPIC-ADD ON

## 2015-03-12 LAB — TROPONIN I

## 2015-03-12 MED ORDER — MORPHINE SULFATE 4 MG/ML IJ SOLN
4.0000 mg | Freq: Once | INTRAMUSCULAR | Status: AC
  Administered 2015-03-12: 4 mg via INTRAVENOUS
  Filled 2015-03-12: qty 1

## 2015-03-12 MED ORDER — SODIUM CHLORIDE 0.9 % IV BOLUS (SEPSIS)
500.0000 mL | Freq: Once | INTRAVENOUS | Status: AC
  Administered 2015-03-12: 500 mL via INTRAVENOUS

## 2015-03-12 MED ORDER — DIAZEPAM 5 MG PO TABS: 5.0000 mg | ORAL_TABLET | Freq: Three times a day (TID) | ORAL | Status: DC | PRN

## 2015-03-12 MED ORDER — IOHEXOL 350 MG/ML SOLN
80.0000 mL | Freq: Once | INTRAVENOUS | Status: AC | PRN
  Administered 2015-03-12: 80 mL via INTRAVENOUS

## 2015-03-12 MED ORDER — KETOROLAC TROMETHAMINE 30 MG/ML IJ SOLN
30.0000 mg | Freq: Once | INTRAMUSCULAR | Status: AC
  Administered 2015-03-12: 30 mg via INTRAVENOUS
  Filled 2015-03-12: qty 1

## 2015-03-12 MED ORDER — HYDROCODONE-ACETAMINOPHEN 5-325 MG PO TABS: 1.0000 | ORAL_TABLET | Freq: Four times a day (QID) | ORAL | Status: DC | PRN

## 2015-03-12 MED ORDER — PREDNISONE 50 MG PO TABS: 50.0000 mg | ORAL_TABLET | Freq: Every day | ORAL | Status: DC

## 2015-03-12 NOTE — ED Notes (Signed)
Pt sent here from UC for further work-up of R shoulder pain that radiates to neck and chest tightness.  Pt also c/o nausea and weakness.
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
## 2015-03-12 NOTE — ED Notes (Signed)
Patient transported to CT 

## 2015-03-12 NOTE — Discharge Instructions (Signed)
Return here as needed.  Follow-up with the neurosurgeon and primary care Dr. provided.  Use ice and heat on your neck

## 2015-03-12 NOTE — ED Notes (Signed)
-  c/o has not felt right x couple of days, cant sleep (frequent insomnia) tingling, numbness in hands frequently. Chest doesn't hurt but feels tight and not right. NAD, w/d/color good

## 2015-03-12 NOTE — ED Notes (Signed)
Patient transported to X-ray 
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
## 2015-03-16 ENCOUNTER — Other Ambulatory Visit: Payer: Self-pay | Admitting: Family Medicine

## 2015-03-16 DIAGNOSIS — M7989 Other specified soft tissue disorders: Secondary | ICD-10-CM

## 2015-03-17 ENCOUNTER — Other Ambulatory Visit: Payer: 59

## 2015-03-18 ENCOUNTER — Other Ambulatory Visit: Payer: 59

## 2015-03-22 ENCOUNTER — Ambulatory Visit
Admission: RE | Admit: 2015-03-22 | Discharge: 2015-03-22 | Disposition: A | Discharge status: Home / Self Care | Payer: 59 | Source: Ambulatory Visit | Attending: Family Medicine | Admitting: Family Medicine

## 2015-03-22 DIAGNOSIS — M7989 Other specified soft tissue disorders: Secondary | ICD-10-CM

## 2015-05-10 ENCOUNTER — Other Ambulatory Visit: Payer: Self-pay

## 2015-05-20 ENCOUNTER — Other Ambulatory Visit (HOSPITAL_COMMUNITY): Payer: Self-pay | Admitting: Family Medicine

## 2015-05-20 DIAGNOSIS — Z1231 Encounter for screening mammogram for malignant neoplasm of breast: Secondary | ICD-10-CM

## 2015-06-03 ENCOUNTER — Ambulatory Visit (HOSPITAL_COMMUNITY)
Admission: RE | Admit: 2015-06-03 | Discharge: 2015-06-03 | Disposition: A | Discharge status: Home / Self Care | Payer: 59 | Source: Ambulatory Visit | Attending: Family Medicine | Admitting: Family Medicine

## 2015-06-03 DIAGNOSIS — Z1231 Encounter for screening mammogram for malignant neoplasm of breast: Secondary | ICD-10-CM

## 2015-09-08 ENCOUNTER — Encounter: Payer: Self-pay | Admitting: Neurology

## 2015-09-08 ENCOUNTER — Ambulatory Visit (INDEPENDENT_AMBULATORY_CARE_PROVIDER_SITE_OTHER): Payer: 59 | Admitting: Neurology

## 2015-09-08 VITALS — BP 154/97 | HR 77 | Ht 65.0 in | Wt 252.0 lb

## 2015-09-08 DIAGNOSIS — E1142 Type 2 diabetes mellitus with diabetic polyneuropathy: Secondary | ICD-10-CM | POA: Diagnosis not present

## 2015-09-08 DIAGNOSIS — G6289 Other specified polyneuropathies: Secondary | ICD-10-CM

## 2015-09-08 MED ORDER — OXCARBAZEPINE 150 MG PO TABS: 150.0000 mg | ORAL_TABLET | Freq: Two times a day (BID) | ORAL | Status: DC
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
## 2015-09-09 ENCOUNTER — Telehealth: Payer: Self-pay | Admitting: Neurology

## 2015-09-09 NOTE — Telephone Encounter (Signed)
I have spoken with Cynthia [name] this afternoon and per Dr. Terrace ArabiaYan, have advised that labs, with exception of ANA, were negative, that positive ANA is of unknown clinical sig., and that she should continue meds as rx'd.  She asked how long it will take for Oxcarbazepine to work, and I have advised she should take it for at least several weeks, that it will need to reach therapeutic blood levels in order for her to realize it's full benefit.  She verbalized understanding of same/fim

## 2015-09-09 NOTE — Telephone Encounter (Signed)
Please call patient, laboratory showed positive ANA,  Of unknown clinical significance.otherwise  Normal B12, RPR, C-reactive protein,

## 2015-09-09 NOTE — Telephone Encounter (Signed)
Left message to return call 

## 2015-09-09 NOTE — Telephone Encounter (Signed)
Patient returned call

## 2015-09-10 LAB — PROTEIN ELECTROPHORESIS
A/G RATIO SPE: 1.1 (ref 0.7–1.7)
ALBUMIN ELP: 3.7 g/dL (ref 2.9–4.4)
Alpha 1: 0.2 g/dL (ref 0.0–0.4)
Alpha 2: 0.7 g/dL (ref 0.4–1.0)
Beta: 1.1 g/dL (ref 0.7–1.3)
GLOBULIN, TOTAL: 3.3 g/dL (ref 2.2–3.9)
Gamma Globulin: 1.2 g/dL (ref 0.4–1.8)
TOTAL PROTEIN: 7 g/dL (ref 6.0–8.5)

## 2015-09-10 LAB — ANA W/REFLEX IF POSITIVE
Anti Nuclear Antibody(ANA): POSITIVE — AB
Centromere Ab Screen: <0.2 AI (ref 0.0–0.9)
Chromatin Ab SerPl-aCnc: 0.3 AI (ref 0.0–0.9)
DSDNA AB: 1 [IU]/mL (ref 0–9)
ENA RNP Ab: <0.2 AI (ref 0.0–0.9)
ENA SM Ab Ser-aCnc: <0.2 AI (ref 0.0–0.9)
ENA SSA (RO) AB: 0.3 AI (ref 0.0–0.9)
ENA SSB (LA) AB: 1.7 AI — AB (ref 0.0–0.9)

## 2015-09-10 LAB — SEDIMENTATION RATE: SED RATE: 13 mm/h (ref 0–40)

## 2015-09-10 LAB — RPR: RPR: NONREACTIVE

## 2015-09-10 LAB — VITAMIN B12: Vitamin B-12: 380 pg/mL (ref 211–946)

## 2015-09-10 LAB — C-REACTIVE PROTEIN: CRP: 1.2 mg/L (ref 0.0–4.9)

## 2015-09-13 ENCOUNTER — Telehealth: Payer: Self-pay | Admitting: Neurology

## 2015-09-13 NOTE — Telephone Encounter (Signed)
Please call patient, laboratory showed positive ANA, SSB, I will check with her possible symptoms of Sjogren's at her next follow-up in October 12 2015

## 2015-09-30 ENCOUNTER — Telehealth: Payer: Self-pay | Admitting: Neurology

## 2015-09-30 NOTE — Telephone Encounter (Signed)
Patient called to advise OXcarbazepine (TRILEPTAL) 150 MG tablet is not helping at all and feels it's contributing to depression, "I'm very very depressed". Would like to talk to Dr. Terrace ArabiaYan about this.

## 2015-09-30 NOTE — Telephone Encounter (Signed)
I have spoken with Mrs. Cynthia [name].  She sts. Trileptal is not helping the burning pain in her feet.  She is currently taking 1/2 tablet bid.  I have advised that per instructions, she may increase to 1 whole tablet (150mg ) bid.  She sts. she feels Trileptal is making her more depressed--and is reluctant to try an increased dose.  Will check with YY and call her back./fim

## 2015-09-30 NOTE — Telephone Encounter (Signed)
I have reviewed her chart, she already tried gabapentin, Lyrica in the past without helping,  Please advise her that Trileptal is often used for mood disorder, may encourage her to increase to 1 tablet twice a day, until she come see me again in follow up visit

## 2015-10-04 NOTE — Telephone Encounter (Signed)
She is agreeable to try 1 tablet BID.  She will call back with any concerns.

## 2015-10-12 ENCOUNTER — Encounter: Payer: 59 | Admitting: Neurology

## 2015-10-18 ENCOUNTER — Ambulatory Visit: Payer: 59 | Admitting: Neurology

## 2015-10-25 ENCOUNTER — Telehealth: Payer: Self-pay | Admitting: Neurology

## 2015-10-25 NOTE — Telephone Encounter (Signed)
Patient called to advise OXcarbazepine (TRILEPTAL) 150 MG tablet is not working, has tried for 2 more weeks, doesn't do anything at all, has trouble walking, wonders if this test (EMG/NCS) is going to help or how to proceed, doesn't want to come in here and pay for visit and not get any relief.

## 2015-10-25 NOTE — Telephone Encounter (Signed)
Spoke to patient - she will keep her NCV/EMG appointment, as scheduled on 10/27/15.

## 2015-10-26 LAB — HEPATIC FUNCTION PANEL
ALT: 14 U/L (ref 7–35)
AST: 15 U/L (ref 13–35)
Alkaline Phosphatase: 63 U/L (ref 25–125)
BILIRUBIN, TOTAL: 0.3 mg/dL

## 2015-10-26 LAB — BASIC METABOLIC PANEL
BUN: 23 mg/dL — AB (ref 4–21)
Creatinine: 1.2 mg/dL — AB (ref 0.5–1.1)
Glucose: 128 mg/dL
Potassium: 4.1 mmol/L (ref 3.4–5.3)
Sodium: 138 mmol/L (ref 137–147)

## 2015-10-26 LAB — CBC AND DIFFERENTIAL
HEMATOCRIT: 32 % — AB (ref 36–46)
Hemoglobin: 11 g/dL — AB (ref 12.0–16.0)
PLATELETS: 291 10*3/uL (ref 150–399)
WBC: 3.7 10^3/mL

## 2015-10-27 ENCOUNTER — Telehealth: Payer: Self-pay | Admitting: Neurology

## 2015-10-27 ENCOUNTER — Ambulatory Visit (INDEPENDENT_AMBULATORY_CARE_PROVIDER_SITE_OTHER): Payer: 59 | Admitting: Neurology

## 2015-10-27 ENCOUNTER — Other Ambulatory Visit: Payer: Self-pay

## 2015-10-27 DIAGNOSIS — I739 Peripheral vascular disease, unspecified: Secondary | ICD-10-CM | POA: Diagnosis not present

## 2015-10-27 DIAGNOSIS — E1142 Type 2 diabetes mellitus with diabetic polyneuropathy: Secondary | ICD-10-CM

## 2015-10-27 DIAGNOSIS — R202 Paresthesia of skin: Secondary | ICD-10-CM | POA: Diagnosis not present

## 2015-10-27 DIAGNOSIS — E1143 Type 2 diabetes mellitus with diabetic autonomic (poly)neuropathy: Secondary | ICD-10-CM

## 2015-10-27 DIAGNOSIS — G6289 Other specified polyneuropathies: Secondary | ICD-10-CM

## 2015-10-27 MED ORDER — LIDOCAINE 5 % EX OINT: TOPICAL_OINTMENT | CUTANEOUS | Status: DC

## 2015-10-27 MED ORDER — NORTRIPTYLINE HCL 25 MG PO CAPS: ORAL_CAPSULE | ORAL | Status: DC

## 2015-10-27 MED ORDER — LIDOCAINE 0.5 % EX GEL: CUTANEOUS | Status: DC

## 2015-10-27 NOTE — Telephone Encounter (Signed)
I called back and spoke with Ree KidaJack, the pharmacist.  He said Lidocaine 0.5% Gel is not available.  The only dose currently available is 5% (insted of 0.5%) Ointment.  Okay to change Rx?  Please advise.  Thank you.

## 2015-10-27 NOTE — Telephone Encounter (Signed)
Pt called and says [facility] is telling her they do not have the rx for Lidocaine 0.5 % GEL. Can you help? Thank you

## 2015-10-27 NOTE — Telephone Encounter (Signed)
Duplicate task.  Please see previous encounter.
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
## 2015-10-27 NOTE — Telephone Encounter (Signed)
Rx updated and sent per instruction.  Receipt confirmed by pharmacy.

## 2015-10-27 NOTE — Procedures (Signed)
   NCS (NERVE CONDUCTION STUDY) WITH EMG (ELECTROMYOGRAPHY) REPORT   STUDY DATE: October 27 2015 PATIENT NAME: Cynthia [name] DOB: [birth year] MRN: [id number]    TECHNOLOGIST: Gearldine ShownLorraine Jones ELECTROMYOGRAPHER: Levert FeinsteinYan, Jennell Janosik M.D.  CLINICAL INFORMATION: 59 years old female with history of diabetes, presenting with bilateral feet paresthesia.  FINDINGS: NERVE CONDUCTION STUDY: Bilateral peroneal sensory responses were absent. Bilateral peroneal EDB, and tibial motor responses were absent.  Bilateral ulnar sensory and motor responses were normal. Bilateral median sensory response showed moderately prolonged peak latency, with normal snap amplitude. Bilateral median motor responses showed moderately prolonged distal latency, with normal C map amplitude, conduction velocity and mild to moderately prolonged F wave latency,  NEEDLE ELECTROMYOGRAPHY: Selected needle examination was performed at bilateral lower extremity muscles, bilateral lumbar sacral paraspinal muscles.  Needle examination of bilateral tibialis anterior, tibialis posterior, vastus lateralis, right biceps femoris long head was normal.  There was no spontaneous activity at bilateral lumbar sacral paraspinal muscles, bilateral L4-5 S1.  IMPRESSION:   This is an abnormal study. There is electrodiagnostic evidence of mild length dependent axonal peripheral neuropathy.   INTERPRETING PHYSICIAN:   Levert FeinsteinYan, Leelynd Maldonado M.D. Ph.D. [facility] [address], [address] [place], [place] [address] [phone]

## 2015-10-27 NOTE — Telephone Encounter (Signed)
Ok to change to Lidocaine 5%.

## 2015-10-27 NOTE — Telephone Encounter (Signed)
Marchelle Folksmanda with [facility] [phone] called sts Lidocaine 0.5 % GEL does not come in 0.5%. There is 0.4% and 0.1%.  There is an ointment that is .5%. Pease call to clarify.

## 2015-10-28 ENCOUNTER — Telehealth: Payer: Self-pay | Admitting: Neurology

## 2015-10-28 NOTE — Telephone Encounter (Signed)
Spoke to Cynthia [name] and he will remove clonidine from the compound cream.

## 2015-10-28 NOTE — Telephone Encounter (Signed)
Sam w/[facility] [phone] called sts pt has allergic reaction clonidine which is in the Formula 7A. He sts it can be taken out if need to. Please call and advise

## 2015-11-18 ENCOUNTER — Encounter: Payer: Self-pay | Admitting: *Deleted

## 2015-11-19 ENCOUNTER — Telehealth: Payer: Self-pay | Admitting: Neurology

## 2015-11-19 NOTE — Telephone Encounter (Signed)
Pt called and states during her last office visit she was prescribed nortriptyline (PAMELOR) 25 MG capsule however she was told to take one in the morning and two at night. It is working but it is not what is wrote on her prescription. Pt has two days left of medication and needs refill. May call [phone]

## 2015-11-19 NOTE — Telephone Encounter (Signed)
Per last note: We will try nortriptyline 25 mg, titrating to 50 mg every night Rx was written for one at night for 1 week, then 2 at night Per note, patient states she is taking 1 in am and 2 in pm.  I called back but got no answer.  Would you like to change Rx?  Please advise.  Thank you.

## 2015-11-23 NOTE — Telephone Encounter (Signed)
I have called her, nortriptyline 25 mg 2 tablets every night has been very helpful, sometimes she only takes 1 tablet every night

## 2015-12-07 NOTE — Telephone Encounter (Signed)
Pt called said she needs new RX for nortriptyline (PAMELOR) 25 MG capsule with new directions called to pharmacy. She will run out in 3 days. She is taking 1 in am and 2 at night

## 2015-12-09 ENCOUNTER — Telehealth: Payer: Self-pay

## 2015-12-09 MED ORDER — NORTRIPTYLINE HCL 25 MG PO CAPS: ORAL_CAPSULE | ORAL | Status: DC

## 2015-12-09 NOTE — Telephone Encounter (Signed)
Patient is requesting new Rx for dose increase on Nortriptyline  to one in am and two at night.  States this is how she has been taking med, and it has been beneficial.  Please advise.  Thank you.

## 2015-12-09 NOTE — Telephone Encounter (Signed)
I called and spoke with the patient.  She is aware a message has been sent to the provider asking for a dose change to reflect one in am and two at night.

## 2015-12-09 NOTE — Telephone Encounter (Signed)
Pt called back said she has not rec'd a call from [facility]. She is now out of medication. RX qty needs to reflect 3/day. Please call at [phone]

## 2015-12-09 NOTE — Telephone Encounter (Signed)
Please let patient know, order placed.

## 2015-12-10 NOTE — Telephone Encounter (Signed)
I called and spoke with patient.  She expressed understanding and appreciation.

## 2015-12-31 ENCOUNTER — Other Ambulatory Visit (HOSPITAL_COMMUNITY)
Admission: RE | Admit: 2015-12-31 | Discharge: 2015-12-31 | Disposition: A | Discharge status: Home / Self Care | Payer: Managed Care, Other (non HMO) | Source: Ambulatory Visit | Attending: Family Medicine | Admitting: Family Medicine

## 2015-12-31 ENCOUNTER — Other Ambulatory Visit: Payer: Self-pay | Admitting: Family Medicine

## 2015-12-31 DIAGNOSIS — N760 Acute vaginitis: Secondary | ICD-10-CM | POA: Insufficient documentation

## 2015-12-31 DIAGNOSIS — Z1151 Encounter for screening for human papillomavirus (HPV): Secondary | ICD-10-CM | POA: Diagnosis not present

## 2015-12-31 DIAGNOSIS — Z113 Encounter for screening for infections with a predominantly sexual mode of transmission: Secondary | ICD-10-CM | POA: Diagnosis present

## 2015-12-31 DIAGNOSIS — Z01419 Encounter for gynecological examination (general) (routine) without abnormal findings: Secondary | ICD-10-CM | POA: Diagnosis present

## 2016-01-04 LAB — CYTOLOGY - PAP

## 2016-01-26 ENCOUNTER — Ambulatory Visit: Payer: 59 | Admitting: Neurology

## 2016-02-11 LAB — BASIC METABOLIC PANEL
BUN: 19 mg/dL (ref 4–21)
Creatinine: 1.3 mg/dL — AB (ref 0.5–1.1)
Glucose: 146 mg/dL
Potassium: 4.2 mmol/L (ref 3.4–5.3)
Sodium: 141 mmol/L (ref 137–147)

## 2016-02-11 LAB — CBC AND DIFFERENTIAL
HEMATOCRIT: 33 % — AB (ref 36–46)
Hemoglobin: 10.8 g/dL — AB (ref 12.0–16.0)
Platelets: 287 10*3/uL (ref 150–399)
WBC: 4.1 10^3/mL

## 2016-02-11 LAB — LIPID PANEL
Cholesterol: 251 mg/dL — AB (ref 0–200)
HDL: 56 mg/dL (ref 35–70)
LDL CALC: 173 mg/dL
TRIGLYCERIDES: 108 mg/dL (ref 40–160)

## 2016-02-11 LAB — TSH: TSH: 2.1 u[IU]/mL (ref 0.41–5.90)

## 2016-02-11 LAB — HEPATIC FUNCTION PANEL
ALT: 11 U/L (ref 7–35)
AST: 14 U/L (ref 13–35)
Alkaline Phosphatase: 56 U/L (ref 25–125)
BILIRUBIN, TOTAL: 0.4 mg/dL

## 2016-02-11 LAB — HEMOGLOBIN A1C: Hemoglobin A1C: 7.7

## 2016-02-23 ENCOUNTER — Other Ambulatory Visit (HOSPITAL_COMMUNITY): Payer: Self-pay | Admitting: Cardiology

## 2016-02-23 DIAGNOSIS — R55 Syncope and collapse: Secondary | ICD-10-CM

## 2016-02-28 ENCOUNTER — Ambulatory Visit (HOSPITAL_COMMUNITY)
Admission: RE | Admit: 2016-02-28 | Discharge: 2016-02-28 | Disposition: A | Discharge status: Home / Self Care | Payer: Managed Care, Other (non HMO) | Source: Ambulatory Visit | Attending: Cardiology | Admitting: Cardiology

## 2016-02-28 DIAGNOSIS — M797 Fibromyalgia: Secondary | ICD-10-CM | POA: Insufficient documentation

## 2016-02-28 DIAGNOSIS — I5032 Chronic diastolic (congestive) heart failure: Secondary | ICD-10-CM | POA: Insufficient documentation

## 2016-02-28 DIAGNOSIS — E1142 Type 2 diabetes mellitus with diabetic polyneuropathy: Secondary | ICD-10-CM | POA: Diagnosis not present

## 2016-02-28 DIAGNOSIS — R55 Syncope and collapse: Secondary | ICD-10-CM | POA: Diagnosis not present

## 2016-02-28 DIAGNOSIS — E119 Type 2 diabetes mellitus without complications: Secondary | ICD-10-CM | POA: Insufficient documentation

## 2016-02-28 DIAGNOSIS — I110 Hypertensive heart disease with heart failure: Secondary | ICD-10-CM | POA: Diagnosis not present

## 2016-02-28 DIAGNOSIS — E785 Hyperlipidemia, unspecified: Secondary | ICD-10-CM | POA: Insufficient documentation

## 2016-02-28 DIAGNOSIS — I6523 Occlusion and stenosis of bilateral carotid arteries: Secondary | ICD-10-CM | POA: Diagnosis not present

## 2016-02-28 NOTE — Progress Notes (Signed)
VASCULAR LAB PRELIMINARY  PRELIMINARY  PRELIMINARY  PRELIMINARY  Carotid duplex completed.    Preliminary report:  Bilateral:  1-39% ICA stenosis.  Vertebral artery flow is antegrade.     Cynthia [name], RVS 02/28/2016, 2:20 PM

## 2016-02-29 ENCOUNTER — Ambulatory Visit: Payer: 59 | Admitting: Neurology

## 2016-03-23 ENCOUNTER — Ambulatory Visit (INDEPENDENT_AMBULATORY_CARE_PROVIDER_SITE_OTHER): Payer: Managed Care, Other (non HMO) | Admitting: Neurology

## 2016-03-23 ENCOUNTER — Encounter: Payer: Self-pay | Admitting: Neurology

## 2016-03-23 VITALS — BP 138/91 | HR 81 | Ht 65.0 in | Wt 240.5 lb

## 2016-03-23 DIAGNOSIS — I739 Peripheral vascular disease, unspecified: Secondary | ICD-10-CM | POA: Diagnosis not present

## 2016-03-23 MED ORDER — L-METHYLFOLATE-B6-B12 3-35-2 MG PO TABS: 1.0000 | ORAL_TABLET | Freq: Two times a day (BID) | ORAL | Status: DC

## 2016-03-23 MED ORDER — NORTRIPTYLINE HCL 25 MG PO CAPS: ORAL_CAPSULE | ORAL | Status: DC
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
## 2016-05-26 LAB — HM DIABETES EYE EXAM

## 2016-07-05 ENCOUNTER — Other Ambulatory Visit: Payer: Self-pay | Admitting: Family Medicine

## 2016-07-05 ENCOUNTER — Other Ambulatory Visit: Payer: Self-pay | Admitting: Cardiology

## 2016-07-05 DIAGNOSIS — Z1231 Encounter for screening mammogram for malignant neoplasm of breast: Secondary | ICD-10-CM

## 2016-07-14 ENCOUNTER — Ambulatory Visit
Admission: RE | Admit: 2016-07-14 | Discharge: 2016-07-14 | Disposition: A | Discharge status: Home / Self Care | Payer: Managed Care, Other (non HMO) | Source: Ambulatory Visit | Attending: Cardiology | Admitting: Cardiology

## 2016-07-14 DIAGNOSIS — Z1231 Encounter for screening mammogram for malignant neoplasm of breast: Secondary | ICD-10-CM

## 2016-07-14 LAB — HM MAMMOGRAPHY

## 2016-08-24 ENCOUNTER — Ambulatory Visit: Payer: Managed Care, Other (non HMO) | Admitting: *Deleted

## 2016-11-20 ENCOUNTER — Encounter (HOSPITAL_COMMUNITY): Payer: Self-pay | Admitting: Emergency Medicine

## 2016-11-20 ENCOUNTER — Ambulatory Visit (HOSPITAL_COMMUNITY)
Admission: EM | Admit: 2016-11-20 | Discharge: 2016-11-20 | Disposition: A | Discharge status: Home / Self Care | Payer: Managed Care, Other (non HMO) | Attending: Emergency Medicine | Admitting: Emergency Medicine

## 2016-11-20 DIAGNOSIS — I10 Essential (primary) hypertension: Secondary | ICD-10-CM

## 2016-11-20 LAB — POCT I-STAT, CHEM 8
BUN: 22 mg/dL — AB (ref 6–20)
CALCIUM ION: 1.21 mmol/L (ref 1.15–1.40)
CHLORIDE: 105 mmol/L (ref 101–111)
Creatinine, Ser: 1.3 mg/dL — ABNORMAL HIGH (ref 0.44–1.00)
GLUCOSE: 98 mg/dL (ref 65–99)
HCT: 33 % — ABNORMAL LOW (ref 36.0–46.0)
Hemoglobin: 11.2 g/dL — ABNORMAL LOW (ref 12.0–15.0)
Potassium: 4 mmol/L (ref 3.5–5.1)
SODIUM: 138 mmol/L (ref 135–145)
TCO2: 25 mmol/L (ref 0–100)

## 2016-11-20 MED ORDER — SPIRONOLACTONE 25 MG PO TABS: 12.5000 mg | ORAL_TABLET | Freq: Every day | ORAL | 0 refills | Status: DC

## 2016-11-20 NOTE — Discharge Instructions (Signed)
We are going to start you on a low-dose of spironolactone. Take half a tablet daily. Follow-up with your primary care doctor in 1-2 weeks to recheck your blood pressure and your blood work. Use Tylenol or the tramadol prescription as needed for the headache. Do not take ibuprofen.

## 2016-11-20 NOTE — ED Triage Notes (Signed)
[facility] with a complaint of HTN over the last month and a headache x 2 days. The patient stated that she has seen her PCP and he adjusted one of her HTN medications but it has not dropped her BP any.
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
## 2017-02-26 ENCOUNTER — Ambulatory Visit: Payer: Self-pay | Admitting: Family Medicine

## 2017-03-14 ENCOUNTER — Ambulatory Visit (INDEPENDENT_AMBULATORY_CARE_PROVIDER_SITE_OTHER): Payer: 59 | Admitting: Family Medicine

## 2017-03-14 ENCOUNTER — Encounter: Payer: Self-pay | Admitting: Family Medicine

## 2017-03-14 VITALS — BP 130/80 | HR 81 | Resp 12 | Ht 65.0 in | Wt 243.1 lb

## 2017-03-14 DIAGNOSIS — Z9989 Dependence on other enabling machines and devices: Secondary | ICD-10-CM | POA: Diagnosis not present

## 2017-03-14 DIAGNOSIS — I129 Hypertensive chronic kidney disease with stage 1 through stage 4 chronic kidney disease, or unspecified chronic kidney disease: Secondary | ICD-10-CM | POA: Diagnosis not present

## 2017-03-14 DIAGNOSIS — M797 Fibromyalgia: Secondary | ICD-10-CM | POA: Diagnosis not present

## 2017-03-14 DIAGNOSIS — N183 Chronic kidney disease, stage 3 unspecified: Secondary | ICD-10-CM

## 2017-03-14 DIAGNOSIS — G4733 Obstructive sleep apnea (adult) (pediatric): Secondary | ICD-10-CM | POA: Diagnosis not present

## 2017-03-14 DIAGNOSIS — E1149 Type 2 diabetes mellitus with other diabetic neurological complication: Secondary | ICD-10-CM

## 2017-03-14 DIAGNOSIS — R600 Localized edema: Secondary | ICD-10-CM | POA: Diagnosis not present

## 2017-03-14 DIAGNOSIS — J309 Allergic rhinitis, unspecified: Secondary | ICD-10-CM | POA: Diagnosis not present

## 2017-03-14 DIAGNOSIS — E1122 Type 2 diabetes mellitus with diabetic chronic kidney disease: Secondary | ICD-10-CM | POA: Insufficient documentation

## 2017-03-14 LAB — COMPREHENSIVE METABOLIC PANEL
ALT: 15 U/L (ref 0–35)
AST: 17 U/L (ref 0–37)
Albumin: 4.1 g/dL (ref 3.5–5.2)
Alkaline Phosphatase: 56 U/L (ref 39–117)
BUN: 32 mg/dL — AB (ref 6–23)
CO2: 28 meq/L (ref 19–32)
CREATININE: 1.35 mg/dL — AB (ref 0.40–1.20)
Calcium: 9.6 mg/dL (ref 8.4–10.5)
Chloride: 103 mEq/L (ref 96–112)
GFR: 51.15 mL/min — ABNORMAL LOW (ref >60.00)
GLUCOSE: 138 mg/dL — AB (ref 70–99)
Potassium: 4.1 mEq/L (ref 3.5–5.1)
SODIUM: 138 meq/L (ref 135–145)
Total Bilirubin: 0.5 mg/dL (ref 0.2–1.2)
Total Protein: 7 g/dL (ref 6.0–8.3)

## 2017-03-14 LAB — MICROALBUMIN / CREATININE URINE RATIO
CREATININE, U: 136.8 mg/dL
Microalb Creat Ratio: 11.3 mg/g (ref 0.0–30.0)
Microalb, Ur: 15.5 mg/dL — ABNORMAL HIGH (ref 0.0–1.9)

## 2017-03-14 LAB — VITAMIN D 25 HYDROXY (VIT D DEFICIENCY, FRACTURES): VITD: 25.78 ng/mL — ABNORMAL LOW (ref 30.00–100.00)

## 2017-03-14 LAB — HEMOGLOBIN A1C: HEMOGLOBIN A1C: 8.2 % — AB (ref 4.6–6.5)

## 2017-03-14 MED ORDER — FLUTICASONE PROPIONATE 50 MCG/ACT NA SUSP: 1.0000 | Freq: Two times a day (BID) | NASAL | 3 refills | Status: DC

## 2017-03-14 MED ORDER — CYCLOBENZAPRINE HCL 10 MG PO TABS: 5.0000 mg | ORAL_TABLET | Freq: Two times a day (BID) | ORAL | 1 refills | Status: DC | PRN

## 2017-03-14 MED ORDER — AMLODIPINE BESYLATE 5 MG PO TABS: 5.0000 mg | ORAL_TABLET | Freq: Every day | ORAL | 1 refills | Status: DC

## 2017-03-14 NOTE — Patient Instructions (Signed)
A few things to remember from today's visit:   Severe obesity (BMI >= 40) (HCC)  Type II diabetes mellitus with neurological manifestations (HCC) - Plan: Hemoglobin A1c, Fructosamine, Comprehensive metabolic panel, Microalbumin / creatinine urine ratio  CKD (chronic kidney disease), stage III - Plan: Comprehensive metabolic panel, VITAMIN D 25 Hydroxy (Vit-D Deficiency, Fractures)  Fibromyalgia syndrome - Plan: cyclobenzaprine (FLEXERIL) 10 MG tablet  Bilateral lower extremity edema  OSA on CPAP - Plan: Ambulatory referral to Pulmonology  Hypertension, unspecified type  Blood pressure goal for most people is less than 140/90. Some populations (older than 60) the goal is less than 150/90.  Most recent cardiologists' recommendations recommend blood pressure at or less than 130/80.   Elevated blood pressure increases the risk of strokes, heart and kidney disease, and eye problems. Regular physical activity and a healthy diet (DASH diet) usually help. Low salt diet. Take medications as instructed.  Caution with some over the counter medications as cold medications, dietary products (for weight loss), and Ibuprofen or Aleve (frequent use);all these medications could cause elevation of blood pressure.   What are some tips for weight loss? People become overweight for many reasons. Weight issues can run in families. They can be caused by unhealthy behaviors and a person's environment. Certain health problems and medicines can also lead to weight gain. There are some simple things you can do to reach and maintain a healthy weight:  Eat small more frequent healthy meals instead 3 bid meals. Also Weight Watchers is a good option. Avoid sweet drinks. These include regular soft drinks, fruit juices, fruit drinks, energy drinks, sweetened iced tea, and flavored milk. Avoid fast foods. Fast foods such as french fries, hamburgers, chicken nuggets, and pizza are high in calories and can cause  weight gain. Eat a healthy breakfast. People who skip breakfast tend to weigh more. Don't watch more than two hours of television per day. Chew sugar-free gum between meals to cut down on snacking. Avoid grocery shopping when you're hungry. Pack a healthy lunch instead of eating out to control what and how much you eat. Eat a lot of fruits and vegetables. Aim for about 2 cups of fruit and 2 to 3 cups of vegetables per day. Aim for 150 minutes per week of moderate-intensity exercise (such as brisk walking), or 75 minutes per week of vigorous exercise (such as jogging or running). OR 15-30 min of daily brisk walking. Be more active. Small changes in physical activity can easily be added to your daily routine. For example, take the stairs instead of the elevator. Take a walk with your family. A daily walk is a great way to get exercise and to catch up on the day's events.   Please be sure medication list is accurate. If a new problem present, please set up appointment sooner than planned today.
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
## 2017-03-14 NOTE — Progress Notes (Signed)
Pre visit review using our clinic review tool, if applicable. No additional management support is needed unless otherwise documented below in the visit note. 

## 2017-03-15 ENCOUNTER — Telehealth: Payer: Self-pay | Admitting: Family Medicine

## 2017-03-15 NOTE — Telephone Encounter (Signed)
Of course, She can pick up Rx tomorrow, It needs to be written Rx. I also have a handout about places she can call and compare prices. Thanks, BJ

## 2017-03-15 NOTE — Telephone Encounter (Signed)
Pt would like to see if Dr SwazilandJordan could give her a Rx for the compression stockings that was talked about on yesterday.

## 2017-03-16 LAB — FRUCTOSAMINE: Fructosamine: 339 umol/L — ABNORMAL HIGH (ref 190–270)

## 2017-03-16 NOTE — Telephone Encounter (Signed)
Pt is calling back to follow-up on the Rx for the compression stockings.

## 2017-03-16 NOTE — Telephone Encounter (Signed)
Patient aware Rx is up front & ready for pick up.

## 2017-03-20 ENCOUNTER — Encounter: Payer: Self-pay | Admitting: Family Medicine

## 2017-04-08 ENCOUNTER — Other Ambulatory Visit: Payer: Self-pay | Admitting: Neurology

## 2017-04-11 ENCOUNTER — Ambulatory Visit: Payer: 59 | Admitting: Family Medicine

## 2017-04-12 ENCOUNTER — Encounter: Payer: Self-pay | Admitting: Family Medicine

## 2017-04-12 ENCOUNTER — Ambulatory Visit (INDEPENDENT_AMBULATORY_CARE_PROVIDER_SITE_OTHER): Payer: 59 | Admitting: Family Medicine

## 2017-04-12 VITALS — BP 120/78 | HR 79 | Temp 98.0°F | Resp 12 | Ht 65.0 in

## 2017-04-12 DIAGNOSIS — H60501 Unspecified acute noninfective otitis externa, right ear: Secondary | ICD-10-CM

## 2017-04-12 DIAGNOSIS — E1149 Type 2 diabetes mellitus with other diabetic neurological complication: Secondary | ICD-10-CM | POA: Diagnosis not present

## 2017-04-12 MED ORDER — CIPROFLOXACIN-DEXAMETHASONE 0.3-0.1 % OT SUSP: 4.0000 [drp] | Freq: Two times a day (BID) | OTIC | 0 refills | Status: DC
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
## 2017-04-12 NOTE — Patient Instructions (Signed)
A few things to remember from today's visit:   Acute otitis externa of right ear, unspecified type - Plan: ciprofloxacin-dexamethasone (CIPRODEX) otic suspension   Please be sure medication list is accurate. If a new problem present, please set up appointment sooner than planned today.        Otitis Externa Otitis externa is an infection of the outer ear canal. The outer ear canal is the area between the outside of the ear and the eardrum. Otitis externa is sometimes called "swimmer's ear." What are the causes? This condition may be caused by:  Swimming in dirty water.  Moisture in the ear.  An injury to the inside of the ear.  An object stuck in the ear.  A cut or scrape on the outside of the ear.  What increases the risk? This condition is more likely to develop in swimmers. What are the signs or symptoms? The first symptom of this condition is often itching in the ear. Later signs and symptoms include:  Swelling of the ear.  Redness in the ear.  Ear pain. The pain may get worse when you pull on your ear.  Pus coming from the ear.  How is this diagnosed? This condition may be diagnosed by examining the ear and testing fluid from the ear for bacteria and funguses. How is this treated? This condition may be treated with:  Antibiotic ear drops. These are often given for 10-14 days.  Medicine to reduce itching and swelling.  Follow these instructions at home:  If you were prescribed antibiotic ear drops, apply them as told by your health care provider. Do not stop using the antibiotic even if your condition improves.  Take over-the-counter and prescription medicines only as told by your health care provider.  Keep all follow-up visits as told by your health care provider. This is important. How is this prevented?  Keep your ear dry. Use the corner of a towel to dry your ear after you swim or bathe.  Avoid scratching or putting things in your ear. Doing  these things can damage the ear canal or remove the protective wax that lines it, which makes it easier for bacteria and funguses to grow.  Avoid swimming in lakes, polluted water, or pools that may not have the right amount of chlorine.  Consider making ear drops and putting 3 or 4 drops in each ear after you swim. Ask your health care provider about how you can make ear drops. Contact a health care provider if:  You have a fever.  After 3 days your ear is still red, swollen, painful, or draining pus.  Your redness, swelling, or pain gets worse.  You have a severe headache.  You have redness, swelling, pain, or tenderness in the area behind your ear. This information is not intended to replace advice given to you by your health care provider. Make sure you discuss any questions you have with your health care provider. Document Released: 10/30/2005 Document Revised: 12/07/2015 Document Reviewed: 08/09/2015 Elsevier Interactive Patient Education  [employer]2018 [employer]

## 2017-04-16 ENCOUNTER — Telehealth: Payer: Self-pay

## 2017-04-16 MED ORDER — CIPROFLOXACIN HCL 500 MG PO TABS: 500.0000 mg | ORAL_TABLET | Freq: Two times a day (BID) | ORAL | 0 refills | Status: AC

## 2017-04-16 NOTE — Telephone Encounter (Signed)
Usually topical abx are recommended for treatment of external otitis, systemic abx is not recommended for mild or moderate cases. On examination TM was normal. Oral/systemic abx recommended for severe cases are similar to topical and has some side effects as rupture of achilles tendon or neuropathy, both are uncommon but she needs to know about these if she decides to try oral quinolone (Cipro). Assuming that infection is still localized in ear canal then next option if oral Cipro 500 mg bid for 7 days.  Follow in 3-4 days if not any improvement.  Thanks, BJ

## 2017-04-16 NOTE — Telephone Encounter (Signed)
Pt called to report that her ear is not improving. She has been using drops since this past Thursday. She states that pain is excruciating and Tylenol is not helping. She denies any discharge or fever. She would like to know what else you would recommend.   Dr. [place] - Please advise. Thanks!

## 2017-04-16 NOTE — Telephone Encounter (Signed)
Spoke with pt and advised. She would like to try oral abx. Potential side effects explained and advised pt to eat yogurt or take probiotics while on abx. Pt also advised to contact office if not improving or pain persists. Pt voiced understanding. Nothing further needed at this time.

## 2017-04-17 NOTE — Telephone Encounter (Signed)
Patient called again today to report that pain in ear has not improved. She describes pain as "excruciating". She would like to know what else she can do.  Dr. [place] - Please advise. Thanks!

## 2017-04-17 NOTE — Telephone Encounter (Signed)
I already made some recommendations in regard to oral abx. If worse she can follow I can evaluate ear canal again.ENT is also an option.

## 2017-04-18 ENCOUNTER — Encounter (HOSPITAL_COMMUNITY): Payer: Self-pay | Admitting: Family Medicine

## 2017-04-18 ENCOUNTER — Ambulatory Visit (HOSPITAL_COMMUNITY)
Admission: EM | Admit: 2017-04-18 | Discharge: 2017-04-18 | Disposition: A | Discharge status: Home / Self Care | Payer: 59 | Attending: Internal Medicine | Admitting: Internal Medicine

## 2017-04-18 DIAGNOSIS — H6021 Malignant otitis externa, right ear: Secondary | ICD-10-CM

## 2017-04-18 DIAGNOSIS — H9201 Otalgia, right ear: Secondary | ICD-10-CM

## 2017-04-18 MED ORDER — CEFTRIAXONE SODIUM 1 G IJ SOLR
INTRAMUSCULAR | Status: AC
  Filled 2017-04-18: qty 10

## 2017-04-18 MED ORDER — IBUPROFEN 800 MG PO TABS: 800.0000 mg | ORAL_TABLET | Freq: Three times a day (TID) | ORAL | 0 refills | Status: DC

## 2017-04-18 MED ORDER — CEFTRIAXONE SODIUM 1 G IJ SOLR
1.0000 g | Freq: Once | INTRAMUSCULAR | Status: AC
  Administered 2017-04-18: 1 g via INTRAMUSCULAR

## 2017-04-18 MED ORDER — HYDROCODONE-ACETAMINOPHEN 5-325 MG PO TABS: 2.0000 | ORAL_TABLET | ORAL | 0 refills | Status: DC | PRN

## 2017-04-18 MED ORDER — AMOXICILLIN-POT CLAVULANATE 875-125 MG PO TABS: 1.0000 | ORAL_TABLET | Freq: Two times a day (BID) | ORAL | 0 refills | Status: DC
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
## 2017-04-18 NOTE — Telephone Encounter (Signed)
Spoke with pt and advised. She states that this morning the ear is some improved. She will wait another few days and see how it is doing. Nothing further needed at this time.

## 2017-04-18 NOTE — Discharge Instructions (Signed)
Continue cipro ear drops if not better then go see the ENT your PCP referred you to.

## 2017-04-18 NOTE — ED Triage Notes (Signed)
Pt here for right ear pain x 3 days currently being treated for ear infection with ciprofloxacin. sts getting worse and she cant sleep.

## 2017-04-22 ENCOUNTER — Ambulatory Visit (HOSPITAL_COMMUNITY)
Admission: EM | Admit: 2017-04-22 | Discharge: 2017-04-22 | Disposition: A | Discharge status: Home / Self Care | Payer: 59 | Attending: Internal Medicine | Admitting: Internal Medicine

## 2017-04-22 DIAGNOSIS — H9201 Otalgia, right ear: Secondary | ICD-10-CM | POA: Diagnosis not present

## 2017-04-22 DIAGNOSIS — S61402A Unspecified open wound of left hand, initial encounter: Secondary | ICD-10-CM

## 2017-04-22 NOTE — ED Triage Notes (Signed)
Patient triaged by Provider.  °
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
## 2017-04-22 NOTE — Discharge Instructions (Signed)
°  Please keep taking your antibiotics for your ear infection as prescribed. Be sure to complete the entire course even if you are feeling better to help prevent infection from coming back.    You may change the bandage on your hand twice a day, more frequent if it gets wet or dirty.  You can gently clean the wound with warm water and soap.  Keep covered with a bandage to help protect the new skin as it heals.

## 2017-05-03 ENCOUNTER — Other Ambulatory Visit: Payer: Self-pay | Admitting: Family Medicine

## 2017-05-03 DIAGNOSIS — M797 Fibromyalgia: Secondary | ICD-10-CM

## 2017-05-30 ENCOUNTER — Other Ambulatory Visit: Payer: Self-pay | Admitting: Family Medicine

## 2017-05-30 DIAGNOSIS — J309 Allergic rhinitis, unspecified: Secondary | ICD-10-CM

## 2017-06-23 ENCOUNTER — Other Ambulatory Visit: Payer: Self-pay | Admitting: Family Medicine

## 2017-06-23 DIAGNOSIS — M797 Fibromyalgia: Secondary | ICD-10-CM

## 2017-06-28 ENCOUNTER — Institutional Professional Consult (permissible substitution): Payer: 59 | Admitting: Internal Medicine

## 2017-07-10 ENCOUNTER — Telehealth (INDEPENDENT_AMBULATORY_CARE_PROVIDER_SITE_OTHER): Payer: Self-pay | Admitting: Orthopaedic Surgery

## 2017-07-10 NOTE — Telephone Encounter (Signed)
Patient called advised she is going back to the dentist Friday and need to know if she need an antibiotic prior to her dental work. The number to contact patient is [phone].

## 2017-07-10 NOTE — Telephone Encounter (Signed)
Patient stated that she is needing a note/letter stating that she doesn't need an antibiotic faxed.   Fax# is [phone].  Please advise.  Thank You.

## 2017-07-11 ENCOUNTER — Other Ambulatory Visit: Payer: Self-pay | Admitting: Neurology

## 2017-07-11 NOTE — Telephone Encounter (Signed)
Faxed to provided number  

## 2017-07-16 DIAGNOSIS — E785 Hyperlipidemia, unspecified: Secondary | ICD-10-CM | POA: Insufficient documentation
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
## 2017-07-17 ENCOUNTER — Ambulatory Visit: Payer: 59 | Admitting: Family Medicine

## 2017-07-17 ENCOUNTER — Telehealth (INDEPENDENT_AMBULATORY_CARE_PROVIDER_SITE_OTHER): Payer: Self-pay | Admitting: Radiology

## 2017-07-17 DIAGNOSIS — Z0289 Encounter for other administrative examinations: Secondary | ICD-10-CM

## 2017-07-17 NOTE — Telephone Encounter (Signed)
She states that her dentist needs something in writing stating that she does not require pre-meds for dental work. Please call pt back to advise.

## 2017-07-17 NOTE — Telephone Encounter (Signed)
Patient going to call back with fax number tomorrow

## 2017-07-19 ENCOUNTER — Telehealth (INDEPENDENT_AMBULATORY_CARE_PROVIDER_SITE_OTHER): Payer: Self-pay | Admitting: Orthopaedic Surgery

## 2017-07-19 NOTE — Telephone Encounter (Signed)
Faxed to provided number  

## 2017-07-19 NOTE — Telephone Encounter (Signed)
Patient called asked if Cynthia [name] will fax paperwork to her Dentist office   The fax# is [phone]   Patient said her appointment with the dentist is tomorrow at 10:30am  The number to contact patient is [phone]

## 2017-07-27 ENCOUNTER — Ambulatory Visit: Payer: 59 | Admitting: Family Medicine

## 2017-08-02 ENCOUNTER — Encounter: Payer: Self-pay | Admitting: Family Medicine
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
## 2017-08-03 ENCOUNTER — Ambulatory Visit (INDEPENDENT_AMBULATORY_CARE_PROVIDER_SITE_OTHER): Payer: 59 | Admitting: Family Medicine

## 2017-08-03 ENCOUNTER — Encounter: Payer: Self-pay | Admitting: Family Medicine

## 2017-08-03 VITALS — BP 110/70 | HR 80 | Resp 12 | Ht 65.0 in | Wt 240.1 lb

## 2017-08-03 DIAGNOSIS — E669 Obesity, unspecified: Secondary | ICD-10-CM

## 2017-08-03 DIAGNOSIS — Z6839 Body mass index (BMI) 39.0-39.9, adult: Secondary | ICD-10-CM

## 2017-08-03 DIAGNOSIS — N183 Chronic kidney disease, stage 3 unspecified: Secondary | ICD-10-CM

## 2017-08-03 DIAGNOSIS — R053 Chronic cough: Secondary | ICD-10-CM

## 2017-08-03 DIAGNOSIS — I129 Hypertensive chronic kidney disease with stage 1 through stage 4 chronic kidney disease, or unspecified chronic kidney disease: Secondary | ICD-10-CM | POA: Diagnosis not present

## 2017-08-03 DIAGNOSIS — E1149 Type 2 diabetes mellitus with other diabetic neurological complication: Secondary | ICD-10-CM | POA: Diagnosis not present

## 2017-08-03 DIAGNOSIS — J309 Allergic rhinitis, unspecified: Secondary | ICD-10-CM

## 2017-08-03 DIAGNOSIS — R600 Localized edema: Secondary | ICD-10-CM

## 2017-08-03 DIAGNOSIS — R05 Cough: Secondary | ICD-10-CM

## 2017-08-03 DIAGNOSIS — IMO0001 Reserved for inherently not codable concepts without codable children: Secondary | ICD-10-CM

## 2017-08-03 LAB — POCT GLYCOSYLATED HEMOGLOBIN (HGB A1C): Hemoglobin A1C: 8.1

## 2017-08-03 MED ORDER — AMLODIPINE BESYLATE 5 MG PO TABS: 2.5000 mg | ORAL_TABLET | Freq: Every day | ORAL | 1 refills | Status: DC

## 2017-08-03 MED ORDER — LIRAGLUTIDE 18 MG/3ML ~~LOC~~ SOPN: PEN_INJECTOR | SUBCUTANEOUS | 1 refills | Status: DC

## 2017-08-03 NOTE — Patient Instructions (Addendum)
A few things to remember from today's visit:   Type II diabetes mellitus with neurological manifestations (HCC) - Plan: POC HgB A1c, liraglutide 18 MG/3ML SOPN  CKD (chronic kidney disease), stage III  Hypertension, renal disease - Plan: amLODipine (NORVASC) 5 MG tablet  Bilateral lower extremity edema  Allergic rhinitis, unspecified seasonality, unspecified trigger  Cough, persistent - Plan: DG Chest 2 View Victoza added today. Amlodipine decreased to 2.5 mg. Keep appt with kidney doctor in 09/2017.  Allera 180 mg and Plain Mucinex may help with cough.    Vein disease is a condition that can affect the veins in the legs. It can cause leg pain, varicose veins, swollen legs, or open sores. Varicose veins are swollen and twisted veins. Things that may help: leg exercises (ankle flexion, walking),compression stocking, OTC horse chestnut seed extract 300 mg twice daily, for itchy skin cortisone and moisturizers.  Compression stockings- [facility] in [place]   DASH Eating Plan DASH stands for "Dietary Approaches to Stop Hypertension." The DASH eating plan is a healthy eating plan that has been shown to reduce high blood pressure (hypertension). It may also reduce your risk for type 2 diabetes, heart disease, and stroke. The DASH eating plan may also help with weight loss. What are tips for following this plan? General guidelines  Avoid eating more than 2,300 mg (milligrams) of salt (sodium) a day. If you have hypertension, you may need to reduce your sodium intake to 1,500 mg a day.  Limit alcohol intake to no more than 1 drink a day for nonpregnant women and 2 drinks a day for men. One drink equals 12 oz of beer, 5 oz of wine, or 1 oz of hard liquor.  Work with your health care provider to maintain a healthy body weight or to lose weight. Ask what an ideal weight is for you.  Get at least 30 minutes of exercise that causes your heart to beat faster (aerobic exercise) most  days of the week. Activities may include walking, swimming, or biking.  Work with your health care provider or diet and nutrition specialist (dietitian) to adjust your eating plan to your individual calorie needs. Reading food labels  Check food labels for the amount of sodium per serving. Choose foods with less than 5 percent of the Daily Value of sodium. Generally, foods with less than 300 mg of sodium per serving fit into this eating plan.  To find whole grains, look for the word "whole" as the first word in the ingredient list. Shopping  Buy products labeled as "low-sodium" or "no salt added."  Buy fresh foods. Avoid canned foods and premade or frozen meals. Cooking  Avoid adding salt when cooking. Use salt-free seasonings or herbs instead of table salt or sea salt. Check with your health care provider or pharmacist before using salt substitutes.  Do not fry foods. Cook foods using healthy methods such as baking, boiling, grilling, and broiling instead.  Cook with heart-healthy oils, such as olive, canola, soybean, or sunflower oil. Meal planning   Eat a balanced diet that includes: ? 5 or more servings of fruits and vegetables each day. At each meal, try to fill half of your plate with fruits and vegetables. ? Up to 6-8 servings of whole grains each day. ? Less than 6 oz of lean meat, poultry, or fish each day. A 3-oz serving of meat is about the same size as a deck of cards. One egg equals 1 oz. ? 2 servings of low-fat  dairy each day. ? A serving of nuts, seeds, or beans 5 times each week. ? Heart-healthy fats. Healthy fats called Omega-3 fatty acids are found in foods such as flaxseeds and coldwater fish, like sardines, salmon, and mackerel.  Limit how much you eat of the following: ? Canned or prepackaged foods. ? Food that is high in trans fat, such as fried foods. ? Food that is high in saturated fat, such as fatty meat. ? Sweets, desserts, sugary drinks, and other foods  with added sugar. ? Full-fat dairy products.  Do not salt foods before eating.  Try to eat at least 2 vegetarian meals each week.  Eat more home-cooked food and less restaurant, buffet, and fast food.  When eating at a restaurant, ask that your food be prepared with less salt or no salt, if possible. What foods are recommended? The items listed may not be a complete list. Talk with your dietitian about what dietary choices are best for you. Grains Whole-grain or whole-wheat bread. Whole-grain or whole-wheat pasta. Brown rice. Orpah Cobb. Bulgur. Whole-grain and low-sodium cereals. Pita bread. Low-fat, low-sodium crackers. Whole-wheat flour tortillas. Vegetables Fresh or frozen vegetables (raw, steamed, roasted, or grilled). Low-sodium or reduced-sodium tomato and vegetable juice. Low-sodium or reduced-sodium tomato sauce and tomato paste. Low-sodium or reduced-sodium canned vegetables. Fruits All fresh, dried, or frozen fruit. Canned fruit in natural juice (without added sugar). Meat and other protein foods Skinless chicken or [place]. Ground chicken or [place]. Pork with fat trimmed off. Fish and seafood. Egg whites. Dried beans, peas, or lentils. Unsalted nuts, nut butters, and seeds. Unsalted canned beans. Lean cuts of beef with fat trimmed off. Low-sodium, lean deli meat. Dairy Low-fat (1%) or fat-free (skim) milk. Fat-free, low-fat, or reduced-fat cheeses. Nonfat, low-sodium ricotta or cottage cheese. Low-fat or nonfat yogurt. Low-fat, low-sodium cheese. Fats and oils Soft margarine without trans fats. Vegetable oil. Low-fat, reduced-fat, or light mayonnaise and salad dressings (reduced-sodium). Canola, safflower, olive, soybean, and sunflower oils. Avocado. Seasoning and other foods Herbs. Spices. Seasoning mixes without salt. Unsalted popcorn and pretzels. Fat-free sweets. What foods are not recommended? The items listed may not be a complete list. Talk with your dietitian about  what dietary choices are best for you. Grains Baked goods made with fat, such as croissants, muffins, or some breads. Dry pasta or rice meal packs. Vegetables Creamed or fried vegetables. Vegetables in a cheese sauce. Regular canned vegetables (not low-sodium or reduced-sodium). Regular canned tomato sauce and paste (not low-sodium or reduced-sodium). Regular tomato and vegetable juice (not low-sodium or reduced-sodium). Rosita Fire. Olives. Fruits Canned fruit in a light or heavy syrup. Fried fruit. Fruit in cream or butter sauce. Meat and other protein foods Fatty cuts of meat. Ribs. Fried meat. Tomasa Blase. Sausage. Bologna and other processed lunch meats. Salami. Fatback. Hotdogs. Bratwurst. Salted nuts and seeds. Canned beans with added salt. Canned or smoked fish. Whole eggs or egg yolks. Chicken or [place] with skin. Dairy Whole or 2% milk, cream, and half-and-half. Whole or full-fat cream cheese. Whole-fat or sweetened yogurt. Full-fat cheese. Nondairy creamers. Whipped toppings. Processed cheese and cheese spreads. Fats and oils Butter. Stick margarine. Lard. Shortening. Ghee. Bacon fat. Tropical oils, such as coconut, palm kernel, or palm oil. Seasoning and other foods Salted popcorn and pretzels. Onion salt, garlic salt, seasoned salt, table salt, and sea salt. Worcestershire sauce. Tartar sauce. Barbecue sauce. Teriyaki sauce. Soy sauce, including reduced-sodium. Steak sauce. Canned and packaged gravies. Fish sauce. Oyster sauce. Cocktail sauce. Horseradish that you find  on the shelf. Ketchup. Mustard. Meat flavorings and tenderizers. Bouillon cubes. Hot sauce and Tabasco sauce. Premade or packaged marinades. Premade or packaged taco seasonings. Relishes. Regular salad dressings. Where to find more information:  National Heart, Lung, and Blood Institute: PopSteam.is  American Heart Association: www.heart.org Summary  The DASH eating plan is a healthy eating plan that has been shown to  reduce high blood pressure (hypertension). It may also reduce your risk for type 2 diabetes, heart disease, and stroke.  With the DASH eating plan, you should limit salt (sodium) intake to 2,300 mg a day. If you have hypertension, you may need to reduce your sodium intake to 1,500 mg a day.  When on the DASH eating plan, aim to eat more fresh fruits and vegetables, whole grains, lean proteins, low-fat dairy, and heart-healthy fats.  Work with your health care provider or diet and nutrition specialist (dietitian) to adjust your eating plan to your individual calorie needs. This information is not intended to replace advice given to you by your health care provider. Make sure you discuss any questions you have with your health care provider. Document Released: 10/19/2011 Document Revised: 10/23/2016 Document Reviewed: 10/23/2016 Elsevier Interactive Patient Education  2017 [employer].  Please be sure medication list is accurate. If a new problem present, please set up appointment sooner than planned today.

## 2017-08-06 ENCOUNTER — Other Ambulatory Visit: Payer: Self-pay

## 2017-08-06 ENCOUNTER — Ambulatory Visit: Payer: 59

## 2017-08-06 ENCOUNTER — Telehealth: Payer: Self-pay | Admitting: Family Medicine

## 2017-08-06 DIAGNOSIS — E1149 Type 2 diabetes mellitus with other diabetic neurological complication: Secondary | ICD-10-CM | POA: Insufficient documentation

## 2017-08-06 MED ORDER — PEN NEEDLES 32G X 4 MM MISC: 1.0000 | Freq: Every day | 3 refills | Status: DC

## 2017-08-06 NOTE — Progress Notes (Signed)
Patient was in today for diabetic teaching. Per Dr. [place] patient is to start Victoza 0.6mg  Subcutaneous daily for the next 3-4 weeks and then increase dosage to 1.2mg . I provided patient with the following:  Medication Samples have been provided to the patient.  Drug name: Victoza       Strength: 1.2mg /1.8mg        Qty:1  LOT: [id number]  Exp.Date: 07-2018  Dosing instructions: Inject 0.6mg  subcutaneous daily for the next 3-4 weeks and then increase dosage to 1.2mg .  The patient has been instructed regarding the correct time, dose, and frequency of taking this medication, including desired effects and most common side effects.   Patient was able to demonstrate successful injection technique. Patient did express concerns over cost of medication and needles. Advised to search online for coupons or discount codes. Patient verbalized understanding.   Cynthia [name] 3:11 PM 08/06/2017

## 2017-08-06 NOTE — Telephone Encounter (Signed)
Pt states she cannot afford the Insulin Pen Needle (PEN NEEDLES) 32G X 4 MM MISC  Would like to know if we have any samples?  Pt states she doesn't t know how to use, and how often to do. Please call back.

## 2017-08-06 NOTE — Telephone Encounter (Signed)
I spoke with patient. She is scheduled to come in this afternoon to pick up the pen needles and to have a teaching on how to use them.

## 2017-08-14 ENCOUNTER — Other Ambulatory Visit: Payer: Self-pay | Admitting: Family Medicine

## 2017-08-14 DIAGNOSIS — M797 Fibromyalgia: Secondary | ICD-10-CM

## 2017-08-27 ENCOUNTER — Ambulatory Visit (INDEPENDENT_AMBULATORY_CARE_PROVIDER_SITE_OTHER): Payer: 59 | Admitting: Family Medicine

## 2017-08-27 ENCOUNTER — Encounter: Payer: Self-pay | Admitting: Family Medicine

## 2017-08-27 VITALS — BP 122/70 | HR 90 | Resp 12 | Ht 65.0 in

## 2017-08-27 DIAGNOSIS — J309 Allergic rhinitis, unspecified: Secondary | ICD-10-CM

## 2017-08-27 DIAGNOSIS — I129 Hypertensive chronic kidney disease with stage 1 through stage 4 chronic kidney disease, or unspecified chronic kidney disease: Secondary | ICD-10-CM | POA: Diagnosis not present

## 2017-08-27 DIAGNOSIS — Z6839 Body mass index (BMI) 39.0-39.9, adult: Secondary | ICD-10-CM

## 2017-08-27 DIAGNOSIS — R600 Localized edema: Secondary | ICD-10-CM

## 2017-08-27 MED ORDER — IPRATROPIUM BROMIDE 0.06 % NA SOLN: 2.0000 | Freq: Four times a day (QID) | NASAL | 3 refills | Status: DC

## 2017-08-27 MED ORDER — MOMETASONE FUROATE 50 MCG/ACT NA SUSP: 2.0000 | Freq: Every day | NASAL | 3 refills | Status: DC

## 2017-08-27 MED ORDER — MONTELUKAST SODIUM 10 MG PO TABS: 10.0000 mg | ORAL_TABLET | Freq: Every day | ORAL | 3 refills | Status: DC

## 2017-08-27 NOTE — Patient Instructions (Addendum)
A few things to remember from today's visit:   Allergic rhinitis, unspecified seasonality, unspecified trigger - Plan: mometasone (NASONEX) 50 MCG/ACT nasal spray, montelukast (SINGULAIR) 10 MG tablet, ipratropium (ATROVENT) 0.06 % nasal spray  Hypertension, renal disease  Bilateral lower extremity edema - Plan: Ambulatory referral to Vascular Surgery  Monitor blood pressure daily. Stop Amlodipine and Flonase.  Start Singulair, Atrovent,and Nasonex. Over-the-counter Allegra 180 mg daily in the morning.   Please be sure medication list is accurate. If a new problem present, please set up appointment sooner than planned today.
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
## 2017-08-29 ENCOUNTER — Other Ambulatory Visit: Payer: Self-pay | Admitting: Family Medicine

## 2017-09-12 ENCOUNTER — Telehealth: Payer: Self-pay | Admitting: Family Medicine

## 2017-09-12 NOTE — Telephone Encounter (Signed)
I am not sure who gave her this information but I do not control vascular schedule. As I remember this is a chronic problem,she has seen vascular before, and I did not appreciate skin lesion or ulcer or decreased pulses.  [So even though she is frustrated with LE edema, I think 10/2017 appt is appropriate.]  She can be put on a waiting list in case somebody cancels an appt, so hers can be moved to 09/2017.

## 2017-09-12 NOTE — Telephone Encounter (Signed)
Pt states she cannot get into vein and vascular until December. Pt states she cannot wait until then. Some days feet swollen so bad she cannot work.   Pt states vein and vascular advised pt if dr SwazilandJordan would call, they will get her in. Pt would appreciate an appt somewhere asap. thanks

## 2017-09-13 ENCOUNTER — Other Ambulatory Visit: Payer: Self-pay

## 2017-09-13 DIAGNOSIS — R600 Localized edema: Secondary | ICD-10-CM

## 2017-09-14 NOTE — Telephone Encounter (Signed)
Left voicemail.   Pt is on cancellation list. MD appointment and ultrasound has to be done the same day.

## 2017-09-16 ENCOUNTER — Other Ambulatory Visit: Payer: Self-pay | Admitting: Neurology

## 2017-09-18 ENCOUNTER — Telehealth: Payer: Self-pay | Admitting: Neurology

## 2017-09-18 MED ORDER — NORTRIPTYLINE HCL 25 MG PO CAPS: ORAL_CAPSULE | ORAL | 0 refills | Status: DC

## 2017-09-18 NOTE — Telephone Encounter (Signed)
Pt has 1 year med check scheduled, she is requesting a refill of nortriptyline (PAMELOR) 25 MG capsule.  Please send to  [id number]- [place], [place] - [address] [phone] (Phone) [phone] (Fax)

## 2017-09-18 NOTE — Telephone Encounter (Signed)
Patient has pending appt on 11/27/17.  Rx sent to pharmacy.

## 2017-09-18 NOTE — Addendum Note (Signed)
Addended by: Lilla ShookKIRKMAN, Corinne Goucher C on: 09/18/2017 04:38 PM   Modules accepted: Orders

## 2017-09-19 NOTE — Telephone Encounter (Signed)
Spoke with patient concerning referral. Patient verbalized understanding.

## 2017-09-20 ENCOUNTER — Other Ambulatory Visit: Payer: Self-pay | Admitting: Family Medicine

## 2017-09-20 DIAGNOSIS — M797 Fibromyalgia: Secondary | ICD-10-CM

## 2017-09-26 ENCOUNTER — Other Ambulatory Visit: Payer: Self-pay | Admitting: Family Medicine

## 2017-10-01 ENCOUNTER — Institutional Professional Consult (permissible substitution): Payer: 59 | Admitting: Pulmonary Disease

## 2017-10-12 ENCOUNTER — Other Ambulatory Visit: Payer: Self-pay | Admitting: Family Medicine

## 2017-10-12 DIAGNOSIS — E1149 Type 2 diabetes mellitus with other diabetic neurological complication: Secondary | ICD-10-CM

## 2017-10-15 ENCOUNTER — Institutional Professional Consult (permissible substitution): Payer: 59 | Admitting: Pulmonary Disease

## 2017-10-24 ENCOUNTER — Ambulatory Visit (INDEPENDENT_AMBULATORY_CARE_PROVIDER_SITE_OTHER): Payer: 59 | Admitting: Vascular Surgery

## 2017-10-24 ENCOUNTER — Ambulatory Visit (HOSPITAL_COMMUNITY)
Admission: RE | Admit: 2017-10-24 | Discharge: 2017-10-24 | Disposition: A | Discharge status: Home / Self Care | Payer: 59 | Source: Ambulatory Visit | Attending: Vascular Surgery | Admitting: Vascular Surgery

## 2017-10-24 ENCOUNTER — Encounter: Payer: Self-pay | Admitting: Vascular Surgery

## 2017-10-24 ENCOUNTER — Other Ambulatory Visit: Payer: Self-pay

## 2017-10-24 VITALS — BP 134/89 | HR 75 | Temp 97.7°F | Resp 16 | Ht 66.0 in | Wt 240.0 lb

## 2017-10-24 DIAGNOSIS — I890 Lymphedema, not elsewhere classified: Secondary | ICD-10-CM | POA: Diagnosis not present

## 2017-10-24 DIAGNOSIS — I82812 Embolism and thrombosis of superficial veins of left lower extremities: Secondary | ICD-10-CM | POA: Diagnosis not present

## 2017-10-24 DIAGNOSIS — I868 Varicose veins of other specified sites: Secondary | ICD-10-CM | POA: Insufficient documentation

## 2017-10-24 DIAGNOSIS — R600 Localized edema: Secondary | ICD-10-CM | POA: Diagnosis not present
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
## 2017-11-02 ENCOUNTER — Ambulatory Visit: Payer: 59 | Admitting: Family Medicine

## 2017-11-08 ENCOUNTER — Ambulatory Visit: Payer: 59 | Admitting: Internal Medicine

## 2017-11-18 ENCOUNTER — Other Ambulatory Visit: Payer: Self-pay | Admitting: Family Medicine

## 2017-11-18 DIAGNOSIS — M797 Fibromyalgia: Secondary | ICD-10-CM

## 2017-11-19 ENCOUNTER — Ambulatory Visit (INDEPENDENT_AMBULATORY_CARE_PROVIDER_SITE_OTHER): Payer: 59 | Admitting: Pulmonary Disease

## 2017-11-19 ENCOUNTER — Encounter: Payer: Self-pay | Admitting: Pulmonary Disease

## 2017-11-19 VITALS — BP 140/90 | HR 77 | Ht 66.0 in | Wt 247.6 lb

## 2017-11-19 DIAGNOSIS — G4733 Obstructive sleep apnea (adult) (pediatric): Secondary | ICD-10-CM

## 2017-11-19 DIAGNOSIS — Z23 Encounter for immunization: Secondary | ICD-10-CM | POA: Diagnosis not present
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
## 2017-11-19 NOTE — Progress Notes (Signed)
   Subjective:    Patient ID: Cynthia [name], female    DOB: 22-Sep-1956, 62 y.o.   MRN: [id number]  HPI    Review of Systems  Constitutional: Negative for fever and unexpected weight change.  HENT: Positive for congestion, postnasal drip, sinus pressure, sinus pain, sneezing and voice change. Negative for dental problem, ear pain, nosebleeds, rhinorrhea, sore throat and trouble swallowing.   Eyes: Negative for redness and itching.  Respiratory: Positive for cough and shortness of breath. Negative for chest tightness and wheezing.   Cardiovascular: Positive for leg swelling. Negative for palpitations.  Gastrointestinal: Negative for nausea and vomiting.  Genitourinary: Negative for dysuria.  Musculoskeletal: Positive for joint swelling.  Skin: Negative for rash.  Allergic/Immunologic: Positive for environmental allergies. Negative for food allergies and immunocompromised state.  Neurological: Positive for headaches.  Hematological: Does not bruise/bleed easily.  Psychiatric/Behavioral: Negative for dysphoric mood. The patient is not nervous/anxious.        Objective:   Physical Exam        Assessment & Plan:

## 2017-11-19 NOTE — Patient Instructions (Signed)
Will arrange for home sleep study Will call to arrange for follow up after sleep study reviewed  

## 2017-11-20 ENCOUNTER — Other Ambulatory Visit: Payer: Self-pay | Admitting: Family Medicine

## 2017-11-20 ENCOUNTER — Telehealth: Payer: Self-pay | Admitting: *Deleted

## 2017-11-20 MED ORDER — TELMISARTAN-HCTZ 80-12.5 MG PO TABS: 1.0000 | ORAL_TABLET | Freq: Every day | ORAL | 0 refills | Status: DC

## 2017-11-20 NOTE — Telephone Encounter (Signed)
Pharmacy sent refill request for TELMISARTAN 80 MG TABLET  Last refilled 08/02/2017, [facility], [phone], [phone]

## 2017-11-20 NOTE — Telephone Encounter (Signed)
Rx for Micardis HCT 80-12.5mg ,which is the one she reported taking when we followed on HTN (07/2017) was sent to her pharmacy.  Thanks, BJ

## 2017-11-27 ENCOUNTER — Ambulatory Visit (INDEPENDENT_AMBULATORY_CARE_PROVIDER_SITE_OTHER): Payer: 59 | Admitting: Neurology

## 2017-11-27 ENCOUNTER — Encounter: Payer: Self-pay | Admitting: Neurology

## 2017-11-27 VITALS — BP 161/91 | HR 82 | Ht 66.0 in

## 2017-11-27 DIAGNOSIS — E1149 Type 2 diabetes mellitus with other diabetic neurological complication: Secondary | ICD-10-CM

## 2017-11-27 DIAGNOSIS — G63 Polyneuropathy in diseases classified elsewhere: Secondary | ICD-10-CM

## 2017-11-27 DIAGNOSIS — G629 Polyneuropathy, unspecified: Secondary | ICD-10-CM | POA: Insufficient documentation

## 2017-11-27 MED ORDER — NORTRIPTYLINE HCL 25 MG PO CAPS: ORAL_CAPSULE | ORAL | 4 refills | Status: DC
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
## 2018-01-03 ENCOUNTER — Other Ambulatory Visit: Payer: Self-pay | Admitting: Family Medicine

## 2018-01-03 DIAGNOSIS — M797 Fibromyalgia: Secondary | ICD-10-CM

## 2018-01-05 ENCOUNTER — Other Ambulatory Visit: Payer: Self-pay | Admitting: Family Medicine

## 2018-01-05 DIAGNOSIS — E1149 Type 2 diabetes mellitus with other diabetic neurological complication: Secondary | ICD-10-CM

## 2018-01-08 ENCOUNTER — Telehealth: Payer: Self-pay | Admitting: Family Medicine

## 2018-01-08 NOTE — Telephone Encounter (Signed)
Copied from CRM [phone]. Topic: Quick Communication - See Telephone Encounter >> Jan 08, 2018  1:31 PM Windy KalataMichael, Levada Bowersox L, NT wrote: CRM for notification. See Telephone encounter for:  01/08/18.  [facility] [address] is saying they have not received anything from the office. Please advise.

## 2018-01-09 ENCOUNTER — Other Ambulatory Visit: Payer: Self-pay | Admitting: *Deleted

## 2018-01-09 DIAGNOSIS — E1149 Type 2 diabetes mellitus with other diabetic neurological complication: Secondary | ICD-10-CM

## 2018-01-09 MED ORDER — METFORMIN HCL 500 MG PO TABS: 500.0000 mg | ORAL_TABLET | Freq: Two times a day (BID) | ORAL | 1 refills | Status: DC

## 2018-01-09 NOTE — Telephone Encounter (Signed)
Called pt and verified the medication the pt was calling about previously was Metformin. Notified the pt that Metformin refill was sent to requested pharmacy today and to check back with the pharmacy to see when the medication would be available for pick up. Pt also asking for a refill of pen needles. Advised pt that she would need to contact the pharmacy so that a request for needles could be sent to the office. Pt verbalized understanding.
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
## 2018-01-16 ENCOUNTER — Ambulatory Visit (INDEPENDENT_AMBULATORY_CARE_PROVIDER_SITE_OTHER): Payer: 59 | Admitting: Family Medicine

## 2018-01-16 ENCOUNTER — Encounter: Payer: Self-pay | Admitting: Family Medicine

## 2018-01-16 VITALS — BP 140/88 | HR 80 | Temp 97.9°F | Resp 12 | Ht 66.0 in | Wt 241.2 lb

## 2018-01-16 DIAGNOSIS — Z6839 Body mass index (BMI) 39.0-39.9, adult: Secondary | ICD-10-CM

## 2018-01-16 DIAGNOSIS — I129 Hypertensive chronic kidney disease with stage 1 through stage 4 chronic kidney disease, or unspecified chronic kidney disease: Secondary | ICD-10-CM

## 2018-01-16 DIAGNOSIS — F339 Major depressive disorder, recurrent, unspecified: Secondary | ICD-10-CM | POA: Insufficient documentation

## 2018-01-16 DIAGNOSIS — F320 Major depressive disorder, single episode, mild: Secondary | ICD-10-CM

## 2018-01-16 DIAGNOSIS — R600 Localized edema: Secondary | ICD-10-CM | POA: Diagnosis not present

## 2018-01-16 DIAGNOSIS — M797 Fibromyalgia: Secondary | ICD-10-CM

## 2018-01-16 MED ORDER — METOPROLOL TARTRATE 50 MG PO TABS: 50.0000 mg | ORAL_TABLET | Freq: Two times a day (BID) | ORAL | 2 refills | Status: DC

## 2018-01-16 MED ORDER — BUPROPION HCL ER (SR) 100 MG PO TB12: 100.0000 mg | ORAL_TABLET | Freq: Two times a day (BID) | ORAL | 1 refills | Status: DC

## 2018-01-16 MED ORDER — TIZANIDINE HCL 4 MG PO CAPS: 4.0000 mg | ORAL_CAPSULE | Freq: Three times a day (TID) | ORAL | 1 refills | Status: DC | PRN

## 2018-01-16 NOTE — Assessment & Plan Note (Signed)
Today BP mildly elevated, she is reporting higher BPs at home. We will try to change Labetalol to Metoprolol. No changes in Amlodipine or Micardis HCT. Continue monitoring BP at home. Continue low-salt diet. Follow-up in 5 weeks.

## 2018-01-16 NOTE — Assessment & Plan Note (Signed)
We discussed different dietary options, she will try to fast 2-3 times per week. I do not recommend keto diet. Wellbutrin may help with weight loss. Continue regular exercise. Follow-up in 5 weeks.

## 2018-01-16 NOTE — Patient Instructions (Addendum)
A few things to remember from today's visit:   Hypertension, renal disease - Plan: metoprolol tartrate (LOPRESSOR) 50 MG tablet  Class 2 severe obesity due to excess calories with serious comorbidity and body mass index (BMI) of 39.0 to 39.9 in adult (HCC) - Plan: buPROPion (WELLBUTRIN SR) 100 MG 12 hr tablet  Fibromyalgia syndrome - Plan: tiZANidine (ZANAFLEX) 4 MG capsule  Moderate episode of recurrent major depressive disorder (HCC) - Plan: buPROPion (WELLBUTRIN SR) 100 MG 12 hr tablet  Stop Flexeril. Start Zanaflex. Wellbutrin started. Fasting for 3 times per week.  Stop Labetalol and start Metoprolol 50 mg 2 times per day.  Please be sure medication list is accurate. If a new problem present, please set up appointment sooner than planned today.

## 2018-01-16 NOTE — Assessment & Plan Note (Signed)
Great improvement. She does not feel like Amlodipine was aggravating problem and she has not noted changes since she resumed it. He will continue Furosemide 40 mg twice daily.

## 2018-01-16 NOTE — Assessment & Plan Note (Signed)
PHQ-9 score 3 but visit today and based on Hx she provided it seems worse. She has not tolerated Cymbalta in the past. After discussion of pharmacologic options, I think she would benefit from Wellbutrin. Instructed to start Wellbutrin SR 100 mg daily for a week, she can increase it to twice daily if well tolerated. We discussed some side effects and she was instructed about warning signs.   Strongly recommend Arrange an appointment with behavioral therapy. I will see her back in 5 weeks, before if needed.

## 2018-01-16 NOTE — Assessment & Plan Note (Signed)
Otherwise stable. She has not tolerated other treatments in the past, including Cymbalta, Gabapentin, and Lyrica. Continue Nortriptyline, which was initially started to treat peripheral neuropathy. We will change Flexeril for Zanaflex. We discussed side effects of muscle relaxants in general. Good sleep hygiene and low impact exercise also recommended. Follow-up in 5 weeks.

## 2018-01-23 ENCOUNTER — Ambulatory Visit (INDEPENDENT_AMBULATORY_CARE_PROVIDER_SITE_OTHER): Payer: 59

## 2018-01-23 ENCOUNTER — Encounter (INDEPENDENT_AMBULATORY_CARE_PROVIDER_SITE_OTHER): Payer: Self-pay | Admitting: Physician Assistant

## 2018-01-23 ENCOUNTER — Ambulatory Visit (INDEPENDENT_AMBULATORY_CARE_PROVIDER_SITE_OTHER): Payer: 59 | Admitting: Physician Assistant

## 2018-01-23 DIAGNOSIS — M25551 Pain in right hip: Secondary | ICD-10-CM
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
## 2018-01-28 ENCOUNTER — Ambulatory Visit (HOSPITAL_COMMUNITY)
Admission: RE | Admit: 2018-01-28 | Discharge: 2018-01-28 | Disposition: A | Discharge status: Home / Self Care | Payer: 59 | Source: Ambulatory Visit | Attending: Physician Assistant | Admitting: Physician Assistant

## 2018-01-28 ENCOUNTER — Encounter (HOSPITAL_COMMUNITY)
Admission: RE | Admit: 2018-01-28 | Discharge: 2018-01-28 | Disposition: A | Discharge status: Home / Self Care | Payer: 59 | Source: Ambulatory Visit | Attending: Physician Assistant | Admitting: Physician Assistant

## 2018-01-28 DIAGNOSIS — M25551 Pain in right hip: Secondary | ICD-10-CM | POA: Insufficient documentation

## 2018-01-28 MED ORDER — TECHNETIUM TC 99M MEDRONATE IV KIT
22.0000 | PACK | Freq: Once | INTRAVENOUS | Status: AC | PRN
  Administered 2018-01-28: 22 via INTRAVENOUS

## 2018-01-30 ENCOUNTER — Ambulatory Visit (INDEPENDENT_AMBULATORY_CARE_PROVIDER_SITE_OTHER): Payer: 59 | Admitting: Physician Assistant

## 2018-01-30 ENCOUNTER — Telehealth: Payer: Self-pay | Admitting: Family Medicine

## 2018-01-30 ENCOUNTER — Encounter (INDEPENDENT_AMBULATORY_CARE_PROVIDER_SITE_OTHER): Payer: Self-pay | Admitting: Physician Assistant

## 2018-01-30 DIAGNOSIS — M7061 Trochanteric bursitis, right hip: Secondary | ICD-10-CM

## 2018-01-30 MED ORDER — METHYLPREDNISOLONE ACETATE 40 MG/ML IJ SUSP
40.0000 mg | INTRAMUSCULAR | Status: AC | PRN
  Administered 2018-01-30: 40 mg via INTRA_ARTICULAR

## 2018-01-30 MED ORDER — LIDOCAINE HCL 1 % IJ SOLN
3.0000 mL | INTRAMUSCULAR | Status: AC | PRN
  Administered 2018-01-30: 3 mL

## 2018-01-30 NOTE — Telephone Encounter (Signed)
CXR order was placed in 07/2017 and still active. We have discussed this issue before , I think is related to allergies and may get worse with seasonal changes.  Thanks, BJ

## 2018-01-30 NOTE — Telephone Encounter (Signed)
Copied from CRM [id number]. Topic: Quick Communication - See Telephone Encounter >> Jan 30, 2018  3:12 PM Eston Mouldavis, Camry Robello B wrote: CRM for notification. See Telephone encounter for:  Pt is requesting a chest xray, she states her and Dr Swazil[place]Jordan  discussed this awhile back and she is still having excessive mucus. She has been to ENT and they could not determine where mucus was coming from 01/30/18.
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
## 2018-02-01 NOTE — Telephone Encounter (Signed)
Spoke with patient informed her that CXR order was still active and that she could have it done at anytime. Patient verbalized understanding.

## 2018-02-11 ENCOUNTER — Ambulatory Visit (INDEPENDENT_AMBULATORY_CARE_PROVIDER_SITE_OTHER): Payer: 59 | Admitting: Orthopaedic Surgery

## 2018-02-12 ENCOUNTER — Ambulatory Visit (INDEPENDENT_AMBULATORY_CARE_PROVIDER_SITE_OTHER)
Admission: RE | Admit: 2018-02-12 | Discharge: 2018-02-12 | Disposition: A | Discharge status: Home / Self Care | Payer: 59 | Source: Ambulatory Visit | Attending: Family Medicine | Admitting: Family Medicine

## 2018-02-12 DIAGNOSIS — R053 Chronic cough: Secondary | ICD-10-CM

## 2018-02-12 DIAGNOSIS — R05 Cough: Secondary | ICD-10-CM | POA: Diagnosis not present

## 2018-02-13 ENCOUNTER — Other Ambulatory Visit: Payer: Self-pay | Admitting: Family Medicine

## 2018-02-13 ENCOUNTER — Encounter: Payer: Self-pay | Admitting: Family Medicine

## 2018-02-20 ENCOUNTER — Telehealth: Payer: Self-pay | Admitting: *Deleted

## 2018-02-20 NOTE — Telephone Encounter (Signed)
She is supposed to have 5 weeks follow up because this medication was just added. Thanks, BJ

## 2018-02-20 NOTE — Telephone Encounter (Signed)
Requesting refill for Bupropion 100 mg tab ER, 1 tablet by mouth twice a day #180

## 2018-02-21 ENCOUNTER — Other Ambulatory Visit: Payer: Self-pay

## 2018-02-21 ENCOUNTER — Encounter: Payer: Self-pay | Admitting: Family Medicine

## 2018-02-21 ENCOUNTER — Ambulatory Visit (INDEPENDENT_AMBULATORY_CARE_PROVIDER_SITE_OTHER): Payer: 59 | Admitting: Family Medicine

## 2018-02-21 VITALS — BP 122/82 | HR 78 | Temp 98.0°F | Resp 16 | Ht 66.54 in | Wt 231.0 lb

## 2018-02-21 DIAGNOSIS — R600 Localized edema: Secondary | ICD-10-CM

## 2018-02-21 DIAGNOSIS — N183 Chronic kidney disease, stage 3 unspecified: Secondary | ICD-10-CM

## 2018-02-21 DIAGNOSIS — I129 Hypertensive chronic kidney disease with stage 1 through stage 4 chronic kidney disease, or unspecified chronic kidney disease: Secondary | ICD-10-CM

## 2018-02-21 DIAGNOSIS — E559 Vitamin D deficiency, unspecified: Secondary | ICD-10-CM

## 2018-02-21 DIAGNOSIS — E1149 Type 2 diabetes mellitus with other diabetic neurological complication: Secondary | ICD-10-CM

## 2018-02-21 LAB — GLUCOSE, POCT (MANUAL RESULT ENTRY): POC Glucose: 147 mg/dl — AB (ref 70–99)

## 2018-02-21 MED ORDER — BLOOD GLUCOSE MONITOR KIT: PACK | 11 refills | Status: DC
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
## 2018-02-21 NOTE — Patient Instructions (Signed)
     IF you received an x-ray today, you will receive an invoice from [facility]. Please contact [facility] at [phone] with questions or concerns regarding your invoice.   IF you received labwork today, you will receive an invoice from [facility]. Please contact [facility] at [phone] with questions or concerns regarding your invoice.   Our billing staff will not be able to assist you with questions regarding bills from these companies.  You will be contacted with the lab results as soon as they are available. The fastest way to get your results is to activate your My Chart account. Instructions are located on the last page of this paperwork. If you have not heard from us regarding the results in 2 weeks, please contact this office.     

## 2018-02-22 LAB — COMPREHENSIVE METABOLIC PANEL
ALT: 20 IU/L (ref 0–32)
AST: 18 IU/L (ref 0–40)
Albumin/Globulin Ratio: 1.5 (ref 1.2–2.2)
Albumin: 4.1 g/dL (ref 3.6–4.8)
Alkaline Phosphatase: 83 IU/L (ref 39–117)
BUN/Creatinine Ratio: 16 (ref 12–28)
BUN: 20 mg/dL (ref 8–27)
Bilirubin Total: 0.3 mg/dL (ref 0.0–1.2)
CO2: 26 mmol/L (ref 20–29)
Calcium: 9.4 mg/dL (ref 8.7–10.3)
Chloride: 95 mmol/L — ABNORMAL LOW (ref 96–106)
Creatinine, Ser: 1.22 mg/dL — ABNORMAL HIGH (ref 0.57–1.00)
GFR calc Af Amer: 55 mL/min/{1.73_m2} — ABNORMAL LOW (ref >59)
GFR calc non Af Amer: 48 mL/min/{1.73_m2} — ABNORMAL LOW (ref >59)
Globulin, Total: 2.8 g/dL (ref 1.5–4.5)
Glucose: 155 mg/dL — ABNORMAL HIGH (ref 65–99)
Potassium: 4.3 mmol/L (ref 3.5–5.2)
Sodium: 138 mmol/L (ref 134–144)
Total Protein: 6.9 g/dL (ref 6.0–8.5)

## 2018-02-22 LAB — CBC
Hematocrit: 34.3 % (ref 34.0–46.6)
Hemoglobin: 11.1 g/dL (ref 11.1–15.9)
MCH: 27.9 pg (ref 26.6–33.0)
MCHC: 32.4 g/dL (ref 31.5–35.7)
MCV: 86 fL (ref 79–97)
Platelets: 353 10*3/uL (ref 150–379)
RBC: 3.98 x10E6/uL (ref 3.77–5.28)
RDW: 13.8 % (ref 12.3–15.4)
WBC: 4.9 10*3/uL (ref 3.4–10.8)

## 2018-02-22 LAB — LIPID PANEL
Chol/HDL Ratio: 4.9 ratio — ABNORMAL HIGH (ref 0.0–4.4)
Cholesterol, Total: 260 mg/dL — ABNORMAL HIGH (ref 100–199)
HDL: 53 mg/dL (ref >39)
LDL Calculated: 178 mg/dL — ABNORMAL HIGH (ref 0–99)
Triglycerides: 147 mg/dL (ref 0–149)
VLDL Cholesterol Cal: 29 mg/dL (ref 5–40)

## 2018-02-22 LAB — VITAMIN D 25 HYDROXY (VIT D DEFICIENCY, FRACTURES): Vit D, 25-Hydroxy: 22.1 ng/mL — ABNORMAL LOW (ref 30.0–100.0)

## 2018-02-22 LAB — URINALYSIS, DIPSTICK ONLY
Bilirubin, UA: NEGATIVE
Glucose, UA: NEGATIVE
Leukocytes, UA: NEGATIVE
Nitrite, UA: NEGATIVE
RBC, UA: NEGATIVE
Specific Gravity, UA: 1.021 (ref 1.005–1.030)
Urobilinogen, Ur: 0.2 mg/dL (ref 0.2–1.0)
pH, UA: 6 (ref 5.0–7.5)

## 2018-02-22 LAB — MICROALBUMIN / CREATININE URINE RATIO
Creatinine, Urine: 147.1 mg/dL
Microalb/Creat Ratio: 151.4 mg/g creat — ABNORMAL HIGH (ref 0.0–30.0)
Microalbumin, Urine: 222.7 ug/mL

## 2018-02-22 LAB — TSH: TSH: 1.81 u[IU]/mL (ref 0.450–4.500)

## 2018-02-22 LAB — HEMOGLOBIN A1C
Est. average glucose Bld gHb Est-mCnc: 180 mg/dL
Hgb A1c MFr Bld: 7.9 % — ABNORMAL HIGH (ref 4.8–5.6)

## 2018-02-22 NOTE — Telephone Encounter (Signed)
I left a detailed message for the pt with the information below and asked that she call back to schedule an appt.

## 2018-02-25 NOTE — Telephone Encounter (Signed)
After reviewing chart, looks like [facility] new [facility]CP.

## 2018-02-26 ENCOUNTER — Other Ambulatory Visit: Payer: Self-pay

## 2018-02-26 ENCOUNTER — Ambulatory Visit (INDEPENDENT_AMBULATORY_CARE_PROVIDER_SITE_OTHER): Payer: 59 | Admitting: Family Medicine

## 2018-02-26 ENCOUNTER — Encounter: Payer: Self-pay | Admitting: Family Medicine

## 2018-02-26 VITALS — BP 136/80 | HR 70 | Temp 98.8°F | Ht 66.0 in | Wt 235.6 lb

## 2018-02-26 DIAGNOSIS — I10 Essential (primary) hypertension: Secondary | ICD-10-CM

## 2018-02-26 DIAGNOSIS — N183 Chronic kidney disease, stage 3 unspecified: Secondary | ICD-10-CM

## 2018-02-26 DIAGNOSIS — E1149 Type 2 diabetes mellitus with other diabetic neurological complication: Secondary | ICD-10-CM | POA: Diagnosis not present

## 2018-02-26 DIAGNOSIS — Z6839 Body mass index (BMI) 39.0-39.9, adult: Secondary | ICD-10-CM | POA: Diagnosis not present

## 2018-02-26 DIAGNOSIS — E785 Hyperlipidemia, unspecified: Secondary | ICD-10-CM
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
## 2018-02-26 NOTE — Patient Instructions (Signed)
     IF you received an x-ray today, you will receive an invoice from [facility]. Please contact [facility] at [phone] with questions or concerns regarding your invoice.   IF you received labwork today, you will receive an invoice from [facility]. Please contact [facility] at [phone] with questions or concerns regarding your invoice.   Our billing staff will not be able to assist you with questions regarding bills from these companies.  You will be contacted with the lab results as soon as they are available. The fastest way to get your results is to activate your My Chart account. Instructions are located on the last page of this paperwork. If you have not heard from us regarding the results in 2 weeks, please contact this office.     

## 2018-03-08 ENCOUNTER — Telehealth: Payer: Self-pay | Admitting: *Deleted

## 2018-03-08 DIAGNOSIS — Z6839 Body mass index (BMI) 39.0-39.9, adult: Principal | ICD-10-CM

## 2018-03-08 DIAGNOSIS — F320 Major depressive disorder, single episode, mild: Secondary | ICD-10-CM

## 2018-03-08 NOTE — Telephone Encounter (Signed)
Requesting refill of Bupropion HCL SR 100 MG Tablet, #90, 2 Last refill: 01/16/18

## 2018-03-09 ENCOUNTER — Other Ambulatory Visit: Payer: Self-pay | Admitting: Family Medicine

## 2018-03-09 DIAGNOSIS — M797 Fibromyalgia: Secondary | ICD-10-CM

## 2018-03-11 NOTE — Telephone Encounter (Signed)
She needs to contact PCP, she recently established.  Thanks, BJ

## 2018-03-11 NOTE — Telephone Encounter (Signed)
Left message for patient, sent request to patient's new PCP.

## 2018-03-12 ENCOUNTER — Other Ambulatory Visit: Payer: Self-pay | Admitting: Family Medicine

## 2018-03-12 DIAGNOSIS — F320 Major depressive disorder, single episode, mild: Secondary | ICD-10-CM

## 2018-03-12 DIAGNOSIS — Z6839 Body mass index (BMI) 39.0-39.9, adult: Principal | ICD-10-CM

## 2018-03-12 MED ORDER — BUPROPION HCL ER (SR) 100 MG PO TB12: 100.0000 mg | ORAL_TABLET | Freq: Two times a day (BID) | ORAL | 3 refills | Status: DC

## 2018-03-12 NOTE — Addendum Note (Signed)
Addended by: Myles Lipps on: 03/12/2018 11:03 PM   Modules accepted: Orders

## 2018-03-12 NOTE — Telephone Encounter (Signed)
refilled 

## 2018-03-19 ENCOUNTER — Ambulatory Visit (INDEPENDENT_AMBULATORY_CARE_PROVIDER_SITE_OTHER): Payer: 59 | Admitting: Family Medicine

## 2018-03-19 ENCOUNTER — Other Ambulatory Visit: Payer: Self-pay

## 2018-03-19 ENCOUNTER — Encounter: Payer: Self-pay | Admitting: Family Medicine

## 2018-03-19 VITALS — BP 120/84 | Temp 98.5°F | Ht 66.0 in

## 2018-03-19 DIAGNOSIS — I890 Lymphedema, not elsewhere classified: Secondary | ICD-10-CM | POA: Diagnosis not present

## 2018-03-19 DIAGNOSIS — E1165 Type 2 diabetes mellitus with hyperglycemia: Secondary | ICD-10-CM | POA: Diagnosis not present

## 2018-03-19 DIAGNOSIS — I5032 Chronic diastolic (congestive) heart failure: Secondary | ICD-10-CM

## 2018-03-19 DIAGNOSIS — R0609 Other forms of dyspnea: Secondary | ICD-10-CM

## 2018-03-19 DIAGNOSIS — R1084 Generalized abdominal pain: Secondary | ICD-10-CM

## 2018-03-19 DIAGNOSIS — G4733 Obstructive sleep apnea (adult) (pediatric): Secondary | ICD-10-CM

## 2018-03-19 MED ORDER — PEN NEEDLES 32G X 6 MM MISC: 1.0000 | Freq: Every day | 2 refills | Status: DC

## 2018-03-19 MED ORDER — BASAGLAR KWIKPEN 100 UNIT/ML ~~LOC~~ SOPN: 5.0000 [IU] | PEN_INJECTOR | Freq: Every day | SUBCUTANEOUS | 1 refills | Status: DC

## 2018-03-19 MED ORDER — POLYETHYLENE GLYCOL 3350 17 GM/SCOOP PO POWD: 17.0000 g | Freq: Every day | ORAL | 1 refills | Status: DC

## 2018-03-19 MED ORDER — AMLODIPINE BESYLATE 5 MG PO TABS: 2.5000 mg | ORAL_TABLET | Freq: Every day | ORAL | 4 refills | Status: DC

## 2018-03-19 NOTE — Patient Instructions (Signed)
     IF you received an x-ray today, you will receive an invoice from [facility]. Please contact [facility] at [phone] with questions or concerns regarding your invoice.   IF you received labwork today, you will receive an invoice from [facility]. Please contact [facility] at [phone] with questions or concerns regarding your invoice.   Our billing staff will not be able to assist you with questions regarding bills from these companies.  You will be contacted with the lab results as soon as they are available. The fastest way to get your results is to activate your My Chart account. Instructions are located on the last page of this paperwork. If you have not heard from us regarding the results in 2 weeks, please contact this office.     
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
## 2018-03-21 ENCOUNTER — Encounter: Payer: Self-pay | Admitting: Family Medicine

## 2018-03-21 ENCOUNTER — Telehealth: Payer: Self-pay | Admitting: Family Medicine

## 2018-03-21 NOTE — Telephone Encounter (Signed)
done

## 2018-03-21 NOTE — Telephone Encounter (Signed)
Pt called checking on status of referral for PT. Our new workflow for referrals will not allow [place] to send OV notes without being closed. I will send referral as soon as notes are closed. Thanks so much!

## 2018-03-25 ENCOUNTER — Encounter: Payer: Self-pay | Admitting: Family Medicine

## 2018-03-26 ENCOUNTER — Ambulatory Visit: Payer: 59 | Admitting: Family Medicine

## 2018-03-27 ENCOUNTER — Telehealth: Payer: Self-pay

## 2018-03-27 NOTE — Telephone Encounter (Signed)
Please see my previous message that I sent directly to the patient with instructions on how to titrate her basaglar. Please call patient and clarify, this might be a case of getting a message that has already been acted on but is she indeed is still having concerns please educate her on basaglar (long acting insulin) and titration. Thanks

## 2018-03-27 NOTE — Telephone Encounter (Signed)
Copied from CRM [id number]. Topic: General - Other >> Mar 27, 2018 10:18 AM Leafy Ro wrote: Reason for CRM:pt is calling to let md know physical therapy does not treat what she has. Pt has lymphedema and breakthrough physical therapy does not treat.please advise

## 2018-03-27 NOTE — Telephone Encounter (Signed)
Sent patient to [facility] 5/15

## 2018-03-27 NOTE — Telephone Encounter (Signed)
Hello Ladies Do you know of any PT that specializes in lymphedema? If so can we please this patient to them.  Thanks Wynton Hufstetler Guadalupe Dawn

## 2018-03-27 NOTE — Telephone Encounter (Signed)
PEC message sent to Dr. Leretha Pol re: DM med not helping her BS.

## 2018-03-27 NOTE — Telephone Encounter (Signed)
PEC message re: PT sent to Dr. Leretha Pol

## 2018-03-27 NOTE — Telephone Encounter (Signed)
Copied from CRM [id number]. Topic: General - Other >> Mar 25, 2018  9:08 AM Percival Spanish wrote:  Pt call to say the below med is not helping her blood sugar in the morning and is asking for a call back    Insulin Glargine (BASAGLAR KWIKPEN) 100 UNIT/ML SOPN  [phone]

## 2018-03-28 ENCOUNTER — Telehealth: Payer: Self-pay

## 2018-03-28 NOTE — Telephone Encounter (Signed)
Copied from CRM [id number]. Topic: Referral - Status >> Mar 28, 2018  8:19 AM Oneal Grout wrote: Reason for CRM: [facility], [facility] are declining referral due to patient not having cancer, only treat patient with cancer at the lymphedema clinic.  You can try, [facility], [facility], [facility], and [facility]  Per message 5/15- pt sent to [facility]

## 2018-03-28 NOTE — Telephone Encounter (Signed)
Sent pt to [facility] with [facility] on 5/15 they are the only ones that I see that specializes in this. thanks

## 2018-03-28 NOTE — Telephone Encounter (Signed)
IC pt - she notes she did receive the MyChart message from Dr. Leretha Pol but not until yesterday.  She did understand to increase by 2 units every 4 days until BS fasting is at 130.     She verbalized understanding.     She asked when she would hear from PT - advised we have gone through referring to [facility].  Will check on that for an urgent referral due to her extreme swelling and unable to walk. Sent to referrals.

## 2018-04-01 ENCOUNTER — Ambulatory Visit: Payer: 59 | Admitting: Family Medicine

## 2018-04-02 ENCOUNTER — Ambulatory Visit (INDEPENDENT_AMBULATORY_CARE_PROVIDER_SITE_OTHER): Payer: 59 | Admitting: Family Medicine

## 2018-04-02 ENCOUNTER — Other Ambulatory Visit: Payer: Self-pay

## 2018-04-02 ENCOUNTER — Encounter: Payer: Self-pay | Admitting: Family Medicine

## 2018-04-02 VITALS — BP 152/90 | HR 82 | Temp 99.4°F | Ht 66.0 in | Wt 232.6 lb

## 2018-04-02 DIAGNOSIS — I10 Essential (primary) hypertension: Secondary | ICD-10-CM

## 2018-04-02 DIAGNOSIS — Z794 Long term (current) use of insulin: Secondary | ICD-10-CM

## 2018-04-02 DIAGNOSIS — N183 Chronic kidney disease, stage 3 unspecified: Secondary | ICD-10-CM

## 2018-04-02 DIAGNOSIS — E1165 Type 2 diabetes mellitus with hyperglycemia: Secondary | ICD-10-CM | POA: Diagnosis not present

## 2018-04-02 DIAGNOSIS — I890 Lymphedema, not elsewhere classified: Secondary | ICD-10-CM
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
## 2018-04-02 NOTE — Patient Instructions (Addendum)
Nutritionist Megan [employer], [phone] [employer], [phone]   IF you received an x-ray today, you will receive an invoice from [facility]. Please contact [facility] at [phone] with questions or concerns regarding your invoice.   IF you received labwork today, you will receive an invoice from [facility]. Please contact [facility] at [phone] with questions or concerns regarding your invoice.   Our billing staff will not be able to assist you with questions regarding bills from these companies.  You will be contacted with the lab results as soon as they are available. The fastest way to get your results is to activate your My Chart account. Instructions are located on the last page of this paperwork. If you have not heard from [place] regarding the results in 2 weeks, please contact this office.

## 2018-04-05 ENCOUNTER — Encounter: Payer: Self-pay | Admitting: Family Medicine

## 2018-04-05 LAB — PROTEIN, URINE, 24 HOUR
Protein, 24H Urine: 630 mg/24 hr — ABNORMAL HIGH (ref 30–150)
Protein, Ur: 28 mg/dL

## 2018-04-07 ENCOUNTER — Encounter: Payer: Self-pay | Admitting: Family Medicine

## 2018-04-10 ENCOUNTER — Ambulatory Visit: Payer: 59 | Attending: Family Medicine | Admitting: Physical Therapy

## 2018-04-10 ENCOUNTER — Other Ambulatory Visit: Payer: Self-pay | Admitting: Family Medicine

## 2018-04-10 ENCOUNTER — Encounter: Payer: Self-pay | Admitting: Physical Therapy

## 2018-04-10 ENCOUNTER — Other Ambulatory Visit: Payer: Self-pay

## 2018-04-10 DIAGNOSIS — I129 Hypertensive chronic kidney disease with stage 1 through stage 4 chronic kidney disease, or unspecified chronic kidney disease: Secondary | ICD-10-CM

## 2018-04-10 DIAGNOSIS — R262 Difficulty in walking, not elsewhere classified: Secondary | ICD-10-CM | POA: Insufficient documentation

## 2018-04-10 DIAGNOSIS — I890 Lymphedema, not elsewhere classified: Secondary | ICD-10-CM | POA: Diagnosis present
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
## 2018-04-11 ENCOUNTER — Encounter: Payer: Self-pay | Admitting: Family Medicine

## 2018-04-15 ENCOUNTER — Other Ambulatory Visit (INDEPENDENT_AMBULATORY_CARE_PROVIDER_SITE_OTHER): Payer: Self-pay | Admitting: Orthopaedic Surgery

## 2018-04-15 ENCOUNTER — Telehealth (INDEPENDENT_AMBULATORY_CARE_PROVIDER_SITE_OTHER): Payer: Self-pay

## 2018-04-15 MED ORDER — HYDROCODONE-ACETAMINOPHEN 5-325 MG PO TABS: 1.0000 | ORAL_TABLET | Freq: Four times a day (QID) | ORAL | 0 refills | Status: DC | PRN

## 2018-04-15 NOTE — Telephone Encounter (Signed)
It sounds like this is more likely back related to sciatic related.  We performed a right total hip arthroplasty on her in 2015 and earlier this year obtain a three-phase bone scan and it showed no evidence of loosening of the prosthesis.  With what she is describing it sounds like she is having a flareup of sciatica.  I will send in some medications but we do need to have her seen in the office sometime later this week.
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
## 2018-04-15 NOTE — Telephone Encounter (Signed)
Patient aware of the below message  She's scheduled next Tuesday

## 2018-04-15 NOTE — Telephone Encounter (Signed)
See below

## 2018-04-16 ENCOUNTER — Ambulatory Visit: Payer: 59 | Admitting: Rehabilitation

## 2018-04-19 ENCOUNTER — Ambulatory Visit: Payer: 59 | Attending: Family Medicine | Admitting: Physical Therapy

## 2018-04-19 ENCOUNTER — Encounter: Payer: Self-pay | Admitting: Physical Therapy

## 2018-04-19 DIAGNOSIS — R262 Difficulty in walking, not elsewhere classified: Secondary | ICD-10-CM | POA: Insufficient documentation

## 2018-04-19 DIAGNOSIS — I890 Lymphedema, not elsewhere classified: Secondary | ICD-10-CM | POA: Diagnosis present
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
## 2018-04-21 ENCOUNTER — Encounter: Payer: Self-pay | Admitting: Family Medicine

## 2018-04-21 DIAGNOSIS — Z794 Long term (current) use of insulin: Secondary | ICD-10-CM | POA: Insufficient documentation

## 2018-04-22 ENCOUNTER — Encounter: Payer: Self-pay | Admitting: Physical Therapy

## 2018-04-22 ENCOUNTER — Ambulatory Visit: Payer: 59 | Admitting: Physical Therapy

## 2018-04-22 DIAGNOSIS — I890 Lymphedema, not elsewhere classified: Secondary | ICD-10-CM

## 2018-04-22 DIAGNOSIS — R262 Difficulty in walking, not elsewhere classified: Secondary | ICD-10-CM
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
## 2018-04-23 ENCOUNTER — Ambulatory Visit (INDEPENDENT_AMBULATORY_CARE_PROVIDER_SITE_OTHER): Payer: 59 | Admitting: Physician Assistant

## 2018-04-23 ENCOUNTER — Ambulatory Visit (INDEPENDENT_AMBULATORY_CARE_PROVIDER_SITE_OTHER): Payer: 59

## 2018-04-23 ENCOUNTER — Encounter (INDEPENDENT_AMBULATORY_CARE_PROVIDER_SITE_OTHER): Payer: Self-pay | Admitting: Physician Assistant

## 2018-04-23 ENCOUNTER — Ambulatory Visit: Payer: 59 | Admitting: Family Medicine

## 2018-04-23 DIAGNOSIS — M5441 Lumbago with sciatica, right side: Secondary | ICD-10-CM

## 2018-04-23 DIAGNOSIS — G8929 Other chronic pain: Secondary | ICD-10-CM

## 2018-04-23 DIAGNOSIS — M7061 Trochanteric bursitis, right hip: Secondary | ICD-10-CM | POA: Diagnosis not present
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
## 2018-04-24 ENCOUNTER — Ambulatory Visit: Payer: 59 | Admitting: Physical Therapy

## 2018-04-24 ENCOUNTER — Encounter: Payer: Self-pay | Admitting: Physical Therapy

## 2018-04-24 DIAGNOSIS — I890 Lymphedema, not elsewhere classified: Secondary | ICD-10-CM

## 2018-04-24 DIAGNOSIS — R262 Difficulty in walking, not elsewhere classified: Secondary | ICD-10-CM
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
## 2018-04-25 ENCOUNTER — Ambulatory Visit: Payer: 59 | Admitting: Rehabilitation

## 2018-04-25 ENCOUNTER — Encounter: Payer: Self-pay | Admitting: Rehabilitation

## 2018-04-25 ENCOUNTER — Encounter: Payer: Self-pay | Admitting: Family Medicine

## 2018-04-25 DIAGNOSIS — R262 Difficulty in walking, not elsewhere classified: Secondary | ICD-10-CM

## 2018-04-25 DIAGNOSIS — I890 Lymphedema, not elsewhere classified: Secondary | ICD-10-CM
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
## 2018-04-26 ENCOUNTER — Other Ambulatory Visit: Payer: Self-pay | Admitting: Family Medicine

## 2018-04-26 ENCOUNTER — Encounter: Payer: 59 | Admitting: Physical Therapy

## 2018-04-26 MED ORDER — MEDICAL COMPRESSION STOCKINGS MISC: 6 refills | Status: DC

## 2018-04-26 NOTE — Telephone Encounter (Signed)
Your prescription is ready for pick up at the front dest at [address]

## 2018-04-29 ENCOUNTER — Ambulatory Visit: Payer: 59 | Admitting: Physical Therapy

## 2018-04-29 ENCOUNTER — Encounter: Payer: Self-pay | Admitting: Physical Therapy

## 2018-04-29 DIAGNOSIS — I890 Lymphedema, not elsewhere classified: Secondary | ICD-10-CM | POA: Diagnosis not present

## 2018-04-29 DIAGNOSIS — R262 Difficulty in walking, not elsewhere classified: Secondary | ICD-10-CM
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
## 2018-05-01 ENCOUNTER — Ambulatory Visit: Payer: Self-pay | Admitting: Family Medicine

## 2018-05-01 ENCOUNTER — Ambulatory Visit: Payer: 59 | Admitting: Physical Therapy

## 2018-05-01 ENCOUNTER — Encounter: Payer: Self-pay | Admitting: Physical Therapy

## 2018-05-01 DIAGNOSIS — R262 Difficulty in walking, not elsewhere classified: Secondary | ICD-10-CM

## 2018-05-01 DIAGNOSIS — I890 Lymphedema, not elsewhere classified: Secondary | ICD-10-CM | POA: Diagnosis not present
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
## 2018-05-01 NOTE — Telephone Encounter (Signed)
Pt reports bp  is elevated had it checked while at physical therapy today .Headache present  Better now than it was earlier. Alert and oriented Appt made with Dr Leretha PolSantiago for tommorow. Patient advised if headache worse or any neurological symptoms to call back   Reason for Disposition . Systolic BP  >= 160 OR Diastolic >= 100  Answer Assessment - Initial Assessment Questions 1. BLOOD PRESSURE: "What is the blood pressure?" "Did you take at least two measurements 5 minutes apart?"      161/101   2. ONSET: "When did you take your blood pressure?"        1  Hour ago 3. HOW: "How did you obtain the blood pressure?" (e.g., visiting nurse, automatic home BP monitor)         Physical therapist  Automatic   4. HISTORY: "Do you have a history of high blood pressure?"         Yes  5. MEDICATIONS: "Are you taking any medications for blood pressure?" "Have you missed any doses recently?"          No 6. OTHER SYMPTOMS: "Do you have any symptoms?" (e.g., headache, chest pain, blurred vision, difficulty breathing, weakness)      Headache  7. PREGNANCY: "Is there any chance you are pregnant?" "When was your last menstrual period?"          no  Protocols used: HIGH BLOOD PRESSURE-A-AH

## 2018-05-02 ENCOUNTER — Other Ambulatory Visit: Payer: Self-pay

## 2018-05-02 ENCOUNTER — Ambulatory Visit (INDEPENDENT_AMBULATORY_CARE_PROVIDER_SITE_OTHER): Payer: 59 | Admitting: Family Medicine

## 2018-05-02 VITALS — BP 148/96 | HR 68 | Temp 98.7°F | Ht 67.0 in

## 2018-05-02 DIAGNOSIS — I10 Essential (primary) hypertension: Secondary | ICD-10-CM | POA: Diagnosis not present

## 2018-05-02 DIAGNOSIS — E1165 Type 2 diabetes mellitus with hyperglycemia: Secondary | ICD-10-CM | POA: Diagnosis not present

## 2018-05-02 DIAGNOSIS — Z794 Long term (current) use of insulin: Secondary | ICD-10-CM

## 2018-05-02 DIAGNOSIS — I890 Lymphedema, not elsewhere classified: Secondary | ICD-10-CM | POA: Diagnosis not present

## 2018-05-02 DIAGNOSIS — M797 Fibromyalgia: Secondary | ICD-10-CM

## 2018-05-02 MED ORDER — TIZANIDINE HCL 4 MG PO CAPS: 4.0000 mg | ORAL_CAPSULE | Freq: Three times a day (TID) | ORAL | 1 refills | Status: DC | PRN

## 2018-05-02 MED ORDER — CHLORTHALIDONE 25 MG PO TABS: 25.0000 mg | ORAL_TABLET | Freq: Every day | ORAL | 1 refills | Status: DC

## 2018-05-02 MED ORDER — AMLODIPINE BESYLATE 5 MG PO TABS: 5.0000 mg | ORAL_TABLET | Freq: Every day | ORAL | 1 refills | Status: DC

## 2018-05-02 MED ORDER — TELMISARTAN 80 MG PO TABS: 80.0000 mg | ORAL_TABLET | Freq: Every day | ORAL | 1 refills | Status: DC

## 2018-05-02 NOTE — Patient Instructions (Addendum)
.  1. take amlodipine at bedtime 2.  Changed your telmarsartan-hctz to telmasartan alone and chlorthalidone alone (stronger diuretic) 3. Ok to remain off lasiz 4. Continue metoprolol 5. See renal doctor as scheduled     IF you received an x-ray today, you will receive an invoice from [facility]. Please contact [facility] at [phone] with questions or concerns regarding your invoice.   IF you received labwork today, you will receive an invoice from [facility]. Please contact [facility] at [phone] with questions or concerns regarding your invoice.   Our billing staff will not be able to assist you with questions regarding bills from these companies.  You will be contacted with the lab results as soon as they are available. The fastest way to get your results is to activate your My Chart account. Instructions are located on the last page of this paperwork. If you have not heard from us regarding the results in 2 weeks, please contact this office.
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
## 2018-05-03 ENCOUNTER — Ambulatory Visit: Payer: 59 | Admitting: Family Medicine

## 2018-05-03 ENCOUNTER — Encounter: Payer: Self-pay | Admitting: Physical Therapy

## 2018-05-03 ENCOUNTER — Ambulatory Visit: Payer: 59 | Admitting: Physical Therapy

## 2018-05-03 DIAGNOSIS — R262 Difficulty in walking, not elsewhere classified: Secondary | ICD-10-CM

## 2018-05-03 DIAGNOSIS — I890 Lymphedema, not elsewhere classified: Secondary | ICD-10-CM | POA: Diagnosis not present
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
## 2018-05-06 ENCOUNTER — Encounter: Payer: Self-pay | Admitting: Physical Therapy

## 2018-05-06 ENCOUNTER — Encounter: Payer: Self-pay | Admitting: Family Medicine

## 2018-05-06 ENCOUNTER — Ambulatory Visit: Payer: 59 | Admitting: Physical Therapy

## 2018-05-06 DIAGNOSIS — I890 Lymphedema, not elsewhere classified: Secondary | ICD-10-CM

## 2018-05-06 DIAGNOSIS — R262 Difficulty in walking, not elsewhere classified: Secondary | ICD-10-CM
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
## 2018-05-08 ENCOUNTER — Ambulatory Visit: Payer: 59 | Admitting: Physical Therapy

## 2018-05-08 ENCOUNTER — Encounter: Payer: Self-pay | Admitting: Physical Therapy

## 2018-05-08 DIAGNOSIS — I890 Lymphedema, not elsewhere classified: Secondary | ICD-10-CM

## 2018-05-08 DIAGNOSIS — R262 Difficulty in walking, not elsewhere classified: Secondary | ICD-10-CM
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
## 2018-05-09 ENCOUNTER — Ambulatory Visit (INDEPENDENT_AMBULATORY_CARE_PROVIDER_SITE_OTHER): Payer: 59 | Admitting: Physician Assistant

## 2018-05-10 ENCOUNTER — Encounter: Payer: Self-pay | Admitting: Physical Therapy

## 2018-05-10 ENCOUNTER — Ambulatory Visit: Payer: 59 | Admitting: Physical Therapy

## 2018-05-10 DIAGNOSIS — I890 Lymphedema, not elsewhere classified: Secondary | ICD-10-CM

## 2018-05-10 DIAGNOSIS — R262 Difficulty in walking, not elsewhere classified: Secondary | ICD-10-CM
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
## 2018-05-13 ENCOUNTER — Encounter: Payer: Self-pay | Admitting: Physical Therapy

## 2018-05-13 ENCOUNTER — Ambulatory Visit: Payer: 59 | Attending: Family Medicine | Admitting: Physical Therapy

## 2018-05-13 DIAGNOSIS — I890 Lymphedema, not elsewhere classified: Secondary | ICD-10-CM | POA: Diagnosis not present

## 2018-05-13 DIAGNOSIS — R262 Difficulty in walking, not elsewhere classified: Secondary | ICD-10-CM | POA: Insufficient documentation
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
## 2018-05-15 ENCOUNTER — Ambulatory Visit: Payer: 59

## 2018-05-15 DIAGNOSIS — I890 Lymphedema, not elsewhere classified: Secondary | ICD-10-CM | POA: Diagnosis not present

## 2018-05-15 DIAGNOSIS — R262 Difficulty in walking, not elsewhere classified: Secondary | ICD-10-CM
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
## 2018-05-17 ENCOUNTER — Encounter: Payer: Self-pay | Admitting: Physical Therapy

## 2018-05-17 ENCOUNTER — Other Ambulatory Visit: Payer: Self-pay

## 2018-05-17 ENCOUNTER — Encounter

## 2018-05-17 ENCOUNTER — Ambulatory Visit: Payer: 59 | Admitting: Physical Therapy

## 2018-05-17 DIAGNOSIS — I890 Lymphedema, not elsewhere classified: Secondary | ICD-10-CM

## 2018-05-17 DIAGNOSIS — R262 Difficulty in walking, not elsewhere classified: Secondary | ICD-10-CM
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
## 2018-05-20 ENCOUNTER — Encounter: Payer: Self-pay | Admitting: Rehabilitation

## 2018-05-20 ENCOUNTER — Ambulatory Visit: Payer: 59 | Admitting: Rehabilitation

## 2018-05-20 DIAGNOSIS — I890 Lymphedema, not elsewhere classified: Secondary | ICD-10-CM
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
## 2018-05-22 ENCOUNTER — Ambulatory Visit: Payer: 59 | Admitting: Physical Therapy

## 2018-05-22 ENCOUNTER — Encounter: Payer: Self-pay | Admitting: Physical Therapy

## 2018-05-22 DIAGNOSIS — I890 Lymphedema, not elsewhere classified: Secondary | ICD-10-CM | POA: Diagnosis not present

## 2018-05-22 DIAGNOSIS — R262 Difficulty in walking, not elsewhere classified: Secondary | ICD-10-CM
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
## 2018-05-24 ENCOUNTER — Other Ambulatory Visit: Payer: Self-pay | Admitting: Family Medicine

## 2018-05-24 ENCOUNTER — Ambulatory Visit: Payer: 59 | Admitting: Physical Therapy

## 2018-05-24 ENCOUNTER — Encounter: Payer: Self-pay | Admitting: Physical Therapy

## 2018-05-24 DIAGNOSIS — R262 Difficulty in walking, not elsewhere classified: Secondary | ICD-10-CM

## 2018-05-24 DIAGNOSIS — I890 Lymphedema, not elsewhere classified: Secondary | ICD-10-CM
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
## 2018-05-25 ENCOUNTER — Other Ambulatory Visit: Payer: Self-pay | Admitting: Family Medicine

## 2018-05-27 ENCOUNTER — Ambulatory Visit: Payer: 59

## 2018-05-27 DIAGNOSIS — R262 Difficulty in walking, not elsewhere classified: Secondary | ICD-10-CM

## 2018-05-27 DIAGNOSIS — I890 Lymphedema, not elsewhere classified: Secondary | ICD-10-CM
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
## 2018-05-29 ENCOUNTER — Encounter (HOSPITAL_COMMUNITY): Payer: Self-pay | Admitting: Emergency Medicine

## 2018-05-29 ENCOUNTER — Ambulatory Visit (HOSPITAL_COMMUNITY)
Admission: EM | Admit: 2018-05-29 | Discharge: 2018-05-29 | Disposition: A | Discharge status: Home / Self Care | Payer: 59 | Attending: Internal Medicine | Admitting: Internal Medicine

## 2018-05-29 ENCOUNTER — Ambulatory Visit: Payer: 59

## 2018-05-29 DIAGNOSIS — I890 Lymphedema, not elsewhere classified: Secondary | ICD-10-CM

## 2018-05-29 DIAGNOSIS — M5442 Lumbago with sciatica, left side: Secondary | ICD-10-CM

## 2018-05-29 DIAGNOSIS — R262 Difficulty in walking, not elsewhere classified: Secondary | ICD-10-CM

## 2018-05-29 DIAGNOSIS — M5441 Lumbago with sciatica, right side: Secondary | ICD-10-CM | POA: Diagnosis not present

## 2018-05-29 MED ORDER — PREDNISONE 10 MG PO TABS: 20.0000 mg | ORAL_TABLET | Freq: Every day | ORAL | 0 refills | Status: DC
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
## 2018-05-29 NOTE — Discharge Instructions (Addendum)
Continue conservative management of rest, ice, heat, and gentle stretches Prednisone prescribed.  Take as instructed and to completion Make an appointment with back specialist at orthopedic office for further evaluation and management of chronic back pain Return or go to the ER if you have any new or worsening symptoms (fever, chills, chest pain, abdominal pain, changes in bowel or bladder habits, pain radiating into lower legs, etc...)

## 2018-05-29 NOTE — ED Triage Notes (Signed)
Pt sts lower back pain into buttocks; denies injury

## 2018-05-31 ENCOUNTER — Other Ambulatory Visit: Payer: Self-pay

## 2018-05-31 ENCOUNTER — Ambulatory Visit: Payer: 59 | Admitting: Rehabilitation

## 2018-05-31 ENCOUNTER — Encounter: Payer: Self-pay | Admitting: Family Medicine

## 2018-05-31 ENCOUNTER — Ambulatory Visit (INDEPENDENT_AMBULATORY_CARE_PROVIDER_SITE_OTHER): Payer: 59 | Admitting: Family Medicine

## 2018-05-31 ENCOUNTER — Encounter: Payer: Self-pay | Admitting: Rehabilitation

## 2018-05-31 VITALS — BP 126/80 | HR 75 | Temp 99.5°F | Ht 67.0 in | Wt 230.0 lb

## 2018-05-31 DIAGNOSIS — I10 Essential (primary) hypertension: Secondary | ICD-10-CM

## 2018-05-31 DIAGNOSIS — I890 Lymphedema, not elsewhere classified: Secondary | ICD-10-CM | POA: Diagnosis not present

## 2018-05-31 DIAGNOSIS — E1165 Type 2 diabetes mellitus with hyperglycemia: Secondary | ICD-10-CM | POA: Diagnosis not present

## 2018-05-31 DIAGNOSIS — J309 Allergic rhinitis, unspecified: Secondary | ICD-10-CM | POA: Diagnosis not present

## 2018-05-31 DIAGNOSIS — R262 Difficulty in walking, not elsewhere classified: Secondary | ICD-10-CM

## 2018-05-31 DIAGNOSIS — M545 Low back pain: Secondary | ICD-10-CM

## 2018-05-31 DIAGNOSIS — E785 Hyperlipidemia, unspecified: Secondary | ICD-10-CM | POA: Diagnosis not present

## 2018-05-31 MED ORDER — ATORVASTATIN CALCIUM 40 MG PO TABS: 40.0000 mg | ORAL_TABLET | Freq: Every day | ORAL | 3 refills | Status: DC

## 2018-05-31 NOTE — Patient Instructions (Addendum)
  Continue to titrate lantus to fasting goal 90-130   IF you received an x-ray today, you will receive an invoice from [facility]. Please contact [facility] at [phone] with questions or concerns regarding your invoice.   IF you received labwork today, you will receive an invoice from [facility]. Please contact [facility] at [phone] with questions or concerns regarding your invoice.   Our billing staff will not be able to assist you with questions regarding bills from these companies.  You will be contacted with the lab results as soon as they are available. The fastest way to get your results is to activate your My Chart account. Instructions are located on the last page of this paperwork. If you have not heard from us regarding the results in 2 weeks, please contact this office.
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
## 2018-06-01 LAB — COMPREHENSIVE METABOLIC PANEL
ALT: 8 IU/L (ref 0–32)
AST: 9 IU/L (ref 0–40)
Albumin/Globulin Ratio: 1.6 (ref 1.2–2.2)
Albumin: 4.2 g/dL (ref 3.6–4.8)
Alkaline Phosphatase: 75 IU/L (ref 39–117)
BUN/Creatinine Ratio: 15 (ref 12–28)
BUN: 18 mg/dL (ref 8–27)
Bilirubin Total: 0.5 mg/dL (ref 0.0–1.2)
CO2: 29 mmol/L (ref 20–29)
Calcium: 9.7 mg/dL (ref 8.7–10.3)
Chloride: 93 mmol/L — ABNORMAL LOW (ref 96–106)
Creatinine, Ser: 1.17 mg/dL — ABNORMAL HIGH (ref 0.57–1.00)
GFR calc Af Amer: 58 mL/min/{1.73_m2} — ABNORMAL LOW (ref >59)
GFR calc non Af Amer: 50 mL/min/{1.73_m2} — ABNORMAL LOW (ref >59)
Globulin, Total: 2.7 g/dL (ref 1.5–4.5)
Glucose: 198 mg/dL — ABNORMAL HIGH (ref 65–99)
Potassium: 4.6 mmol/L (ref 3.5–5.2)
Sodium: 137 mmol/L (ref 134–144)
Total Protein: 6.9 g/dL (ref 6.0–8.5)

## 2018-06-01 LAB — HEMOGLOBIN A1C
Est. average glucose Bld gHb Est-mCnc: 232 mg/dL
Hgb A1c MFr Bld: 9.7 % — ABNORMAL HIGH (ref 4.8–5.6)

## 2018-06-02 ENCOUNTER — Encounter: Payer: Self-pay | Admitting: Family Medicine

## 2018-06-02 MED ORDER — FLUTICASONE PROPIONATE 50 MCG/ACT NA SUSP: 1.0000 | Freq: Two times a day (BID) | NASAL | 6 refills | Status: DC

## 2018-06-03 ENCOUNTER — Ambulatory Visit (INDEPENDENT_AMBULATORY_CARE_PROVIDER_SITE_OTHER): Payer: 59

## 2018-06-03 ENCOUNTER — Ambulatory Visit: Payer: 59 | Admitting: Rehabilitation

## 2018-06-03 ENCOUNTER — Encounter (INDEPENDENT_AMBULATORY_CARE_PROVIDER_SITE_OTHER): Payer: Self-pay | Admitting: Physician Assistant

## 2018-06-03 ENCOUNTER — Encounter: Payer: Self-pay | Admitting: Rehabilitation

## 2018-06-03 ENCOUNTER — Ambulatory Visit (INDEPENDENT_AMBULATORY_CARE_PROVIDER_SITE_OTHER): Payer: 59 | Admitting: Physician Assistant

## 2018-06-03 DIAGNOSIS — M25531 Pain in right wrist: Secondary | ICD-10-CM

## 2018-06-03 DIAGNOSIS — I890 Lymphedema, not elsewhere classified: Secondary | ICD-10-CM | POA: Diagnosis not present

## 2018-06-03 DIAGNOSIS — M797 Fibromyalgia: Secondary | ICD-10-CM

## 2018-06-03 DIAGNOSIS — M7061 Trochanteric bursitis, right hip: Secondary | ICD-10-CM

## 2018-06-03 DIAGNOSIS — R262 Difficulty in walking, not elsewhere classified: Secondary | ICD-10-CM

## 2018-06-03 MED ORDER — GABAPENTIN 300 MG PO CAPS: 300.0000 mg | ORAL_CAPSULE | Freq: Two times a day (BID) | ORAL | 1 refills | Status: DC
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
## 2018-06-06 ENCOUNTER — Ambulatory Visit: Payer: 59

## 2018-06-07 ENCOUNTER — Encounter: Payer: Self-pay | Admitting: Family Medicine

## 2018-06-09 ENCOUNTER — Other Ambulatory Visit: Payer: Self-pay | Admitting: Family Medicine

## 2018-06-09 DIAGNOSIS — Z6839 Body mass index (BMI) 39.0-39.9, adult: Principal | ICD-10-CM

## 2018-06-09 DIAGNOSIS — F320 Major depressive disorder, single episode, mild: Secondary | ICD-10-CM

## 2018-06-10 ENCOUNTER — Ambulatory Visit: Payer: 59 | Admitting: Rehabilitation

## 2018-06-12 ENCOUNTER — Encounter: Payer: Self-pay | Admitting: Family Medicine

## 2018-06-12 DIAGNOSIS — E1165 Type 2 diabetes mellitus with hyperglycemia: Secondary | ICD-10-CM

## 2018-06-13 ENCOUNTER — Other Ambulatory Visit: Payer: Self-pay

## 2018-06-13 ENCOUNTER — Emergency Department (HOSPITAL_COMMUNITY)
Admission: EM | Admit: 2018-06-13 | Discharge: 2018-06-13 | Disposition: A | Discharge status: Home / Self Care | Payer: 59 | Attending: Emergency Medicine | Admitting: Emergency Medicine

## 2018-06-13 ENCOUNTER — Encounter (HOSPITAL_COMMUNITY): Payer: Self-pay

## 2018-06-13 ENCOUNTER — Encounter: Payer: Self-pay | Admitting: Family Medicine

## 2018-06-13 ENCOUNTER — Ambulatory Visit: Payer: Self-pay

## 2018-06-13 DIAGNOSIS — I129 Hypertensive chronic kidney disease with stage 1 through stage 4 chronic kidney disease, or unspecified chronic kidney disease: Secondary | ICD-10-CM | POA: Diagnosis not present

## 2018-06-13 DIAGNOSIS — R739 Hyperglycemia, unspecified: Secondary | ICD-10-CM | POA: Diagnosis present

## 2018-06-13 DIAGNOSIS — E1165 Type 2 diabetes mellitus with hyperglycemia: Secondary | ICD-10-CM | POA: Insufficient documentation

## 2018-06-13 DIAGNOSIS — Z79899 Other long term (current) drug therapy: Secondary | ICD-10-CM | POA: Diagnosis not present

## 2018-06-13 DIAGNOSIS — Z794 Long term (current) use of insulin: Secondary | ICD-10-CM | POA: Diagnosis not present

## 2018-06-13 DIAGNOSIS — N183 Chronic kidney disease, stage 3 (moderate): Secondary | ICD-10-CM | POA: Insufficient documentation

## 2018-06-13 DIAGNOSIS — Z87891 Personal history of nicotine dependence: Secondary | ICD-10-CM | POA: Insufficient documentation

## 2018-06-13 DIAGNOSIS — I10 Essential (primary) hypertension: Secondary | ICD-10-CM

## 2018-06-13 LAB — CBC
HCT: 37.5 % (ref 36.0–46.0)
HEMOGLOBIN: 12.2 g/dL (ref 12.0–15.0)
MCH: 27.3 pg (ref 26.0–34.0)
MCHC: 32.5 g/dL (ref 30.0–36.0)
MCV: 83.9 fL (ref 78.0–100.0)
Platelets: 281 10*3/uL (ref 150–400)
RBC: 4.47 MIL/uL (ref 3.87–5.11)
RDW: 13.5 % (ref 11.5–15.5)
WBC: 7.5 10*3/uL (ref 4.0–10.5)

## 2018-06-13 LAB — URINALYSIS, ROUTINE W REFLEX MICROSCOPIC
BILIRUBIN URINE: NEGATIVE
GLUCOSE, UA: NEGATIVE mg/dL
HGB URINE DIPSTICK: NEGATIVE
KETONES UR: NEGATIVE mg/dL
LEUKOCYTES UA: NEGATIVE
Nitrite: NEGATIVE
PROTEIN: 30 mg/dL — AB
Specific Gravity, Urine: 1.013 (ref 1.005–1.030)
pH: 8 (ref 5.0–8.0)

## 2018-06-13 LAB — BASIC METABOLIC PANEL
Anion gap: 13 (ref 5–15)
BUN: 18 mg/dL (ref 8–23)
CALCIUM: 9.6 mg/dL (ref 8.9–10.3)
CHLORIDE: 96 mmol/L — AB (ref 98–111)
CO2: 32 mmol/L (ref 22–32)
Creatinine, Ser: 1.15 mg/dL — ABNORMAL HIGH (ref 0.44–1.00)
GFR calc Af Amer: 58 mL/min — ABNORMAL LOW (ref >60)
GFR calc non Af Amer: 50 mL/min — ABNORMAL LOW (ref >60)
Glucose, Bld: 163 mg/dL — ABNORMAL HIGH (ref 70–99)
POTASSIUM: 3.7 mmol/L (ref 3.5–5.1)
Sodium: 141 mmol/L (ref 135–145)

## 2018-06-13 LAB — CBG MONITORING, ED: Glucose-Capillary: 157 mg/dL — ABNORMAL HIGH (ref 70–99)
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
## 2018-06-13 NOTE — Discharge Instructions (Addendum)
It was our pleasure to provide your ER care today - we hope that you feel better.  Drink adequate water, follow diabetic meal plan. Continue your diabetic medication. Check sugars 4x/day (before meals and at bedtime) and record values. Follow up with your doctor in the next few days.  Also have your blood pressure rechecked then, as it is also high today.  Return to ER if worse, new symptoms, high fevers, trouble breathing, other concern.

## 2018-06-13 NOTE — ED Notes (Signed)
Water at bedside. Encouraged patient to drink.

## 2018-06-13 NOTE — ED Triage Notes (Signed)
Pt states that her PCP sent her here for her blood sugars. Pt states her sugar was 567 yesterday. Pt states that she hasn't eaten since midnight last night.

## 2018-06-13 NOTE — Telephone Encounter (Signed)
Pt. called to sched. appt. with Dr. Leretha PolSantiago.  Reported elevated BS yesterday of 597.  Pt. Could not remember what time that was taken. Reported she rec'd MyChart message from Dr. Leretha PolSantiago yesterday, that advised her to increase her insulin by 2 units, and to sched. an appt. ASAP.  Reported  had episode last night that her heart felt like it was pounding for about one hour; denied chest pain or SOB. Stated she was afraid to go to sleep.  Stated that she ate a sandwich of lettuce, tomato, and cheese at 12 MN.  BS of 442 @ 1:00 AM.  Stated she took 2 different doses of Glargine Insulin 8 units, last night; last dose taken at 1:00 AM.  Reported BS of 344 this morning at about 7:00 AM.   Stated has not eaten or taken Metformin this morning.  C/o feeling very tired.  C/o increased urination and mouth being dry, and "feet feel like they are on fire."  BS taken during call @ 11:35 AM; 139.  Stated she is afraid to go another day without seeing the doctor.  No avail. Openings at PCP office.  [facility].  Advised that Dr. Leretha PolSantiago not avail, and pt. needs to go to ER with current status and fluctuation in blood sugars.  Pt. Advised to go to the ER.  Stated her husband is with her, and will take her now.       Reason for Disposition . [1] Blood glucose > 240 mg/dL (40.913.3 mmol/L) AND [8][2] vomiting AND [3] unable to check for ketones (in blood or urine)  Answer Assessment - Initial Assessment Questions 1. BLOOD GLUCOSE: "What is your blood glucose level?"      597 7/31;  344 today @ 7:00 AM (fasting) ; 139 @ 11:35 AM (has not eaten) 2. ONSET: "When did you check the blood glucose?"     See above  3. USUAL RANGE: "What is your glucose level usually?" (e.g., usual fasting morning value, usual evening value)     Has been having trouble regulating; was 200's fasting 4. KETONES: "Do you check for ketones (urine or blood test strips)?" If yes, ask: "What does the test show now?"     No  5. TYPE 1 or 2:  "Do you know what type of diabetes you have?"  (e.g., Type 1, Type 2, Gestational; doesn't know)      Type 2 6. INSULIN: "Do you take insulin?" "What type of insulin(s) do you use? What is the mode of delivery? (syringe, pen (e.g., injection or  pump)?"      Glargine Insulin; last dose taken at 1:00 AM this morning 7. DIABETES PILLS: "Do you take any pills for your diabetes?" If yes, ask: "Have you missed taking any pills recently?"     Has not taken Metformin yet today  8. OTHER SYMPTOMS: "Do you have any symptoms?" (e.g., fever, frequent urination, difficulty breathing, dizziness, weakness, vomiting)     Increased urination, feet on fire, tired, had episode of feeling heart pounding last night x 1 hr.; denied chest pain , mouth dry 9. PREGNANCY: "Is there any chance you are pregnant?" "When was your last menstrual period?"     n/a  Protocols used: DIABETES - HIGH BLOOD SUGAR-A-AH

## 2018-06-14 ENCOUNTER — Ambulatory Visit: Payer: 59 | Attending: Family Medicine | Admitting: Rehabilitation

## 2018-06-14 ENCOUNTER — Encounter: Payer: Self-pay | Admitting: Rehabilitation

## 2018-06-14 ENCOUNTER — Encounter: Payer: Self-pay | Admitting: Family Medicine

## 2018-06-14 DIAGNOSIS — R262 Difficulty in walking, not elsewhere classified: Secondary | ICD-10-CM | POA: Diagnosis present

## 2018-06-14 DIAGNOSIS — I890 Lymphedema, not elsewhere classified: Secondary | ICD-10-CM
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
## 2018-06-14 NOTE — Addendum Note (Signed)
Addended by: Gwenevere AbbotEVIS, Anwar Sakata R on: 06/14/2018 11:18 AM   Modules accepted: Orders

## 2018-06-16 ENCOUNTER — Other Ambulatory Visit: Payer: Self-pay | Admitting: Family Medicine

## 2018-06-17 ENCOUNTER — Ambulatory Visit: Payer: 59

## 2018-06-17 DIAGNOSIS — I890 Lymphedema, not elsewhere classified: Secondary | ICD-10-CM | POA: Diagnosis not present

## 2018-06-17 DIAGNOSIS — R262 Difficulty in walking, not elsewhere classified: Secondary | ICD-10-CM

## 2018-06-17 MED ORDER — BASAGLAR KWIKPEN 100 UNIT/ML ~~LOC~~ SOPN: 40.0000 [IU] | PEN_INJECTOR | Freq: Every day | SUBCUTANEOUS | 1 refills | Status: DC

## 2018-06-17 NOTE — Patient Instructions (Signed)
Cancer Rehab [phone] Start with circles near neck, above collarbones 10 times on each side.     Deep Effective Breath   Standing, sitting, or laying down place both hands on the belly. Take a deep breath IN, expanding the belly; then breath OUT, contracting the belly. Repeat __5__ times. Do __2-3__ sessions per day and before each self massage.  http://gt2.exer.us/866   Copyright  VHI. All rights reserved.  Inguinal Nodes to Axilla - Clear   On involved side, at armpit, make _5__ in-place circles. Then from hip proceed in sections to armpit with stationary circles or pumps _5_ times, this is your pathway. Do _1__ time per day.  Copyright  VHI. All rights reserved.  LEG: Knee to Hip - Clear   Pump up outer thigh of involved leg from knee to outer hip. Then do stationary circles from inner to outer thigh, then do outer thigh again. Next, interlace fingers behind knee IF ABLE and make in-place circles. Do _5_ times of each sequence. Back to pathway. Do _1__ time per day.  Copyright  VHI. All rights reserved.  LEG: Ankle to Hip Sweep   Hands on sides of ankle of involved leg, pump _5__ times up both sides of lower leg, then retrace steps up outer thigh to hip as before and back to pathway. Do _2-3_ times. Do __1_ time per day.  Copyright  VHI. All rights reserved.  FOOT: Dorsum of Foot and Toes Massage   One hand on top of foot make _5_ stationary circles or pumps, then either on top of toes or each individual toe do _5_ pumps. Then retrace all steps pumping back up both sides of lower leg, outer thigh, and then pathway. Finish with what you started with, _5_ circles at involved side arm pit. All _2-3_ times at each sequence. Do _1__ time per day
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
## 2018-06-19 ENCOUNTER — Ambulatory Visit: Payer: 59

## 2018-06-19 DIAGNOSIS — I890 Lymphedema, not elsewhere classified: Secondary | ICD-10-CM | POA: Diagnosis not present

## 2018-06-19 DIAGNOSIS — R262 Difficulty in walking, not elsewhere classified: Secondary | ICD-10-CM
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
## 2018-06-21 ENCOUNTER — Ambulatory Visit: Payer: 59 | Admitting: Rehabilitation

## 2018-06-21 ENCOUNTER — Encounter: Payer: Self-pay | Admitting: Rehabilitation

## 2018-06-21 DIAGNOSIS — I890 Lymphedema, not elsewhere classified: Secondary | ICD-10-CM

## 2018-06-21 DIAGNOSIS — R262 Difficulty in walking, not elsewhere classified: Secondary | ICD-10-CM
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
## 2018-06-24 ENCOUNTER — Telehealth: Payer: Self-pay | Admitting: Rehabilitation

## 2018-06-24 ENCOUNTER — Ambulatory Visit: Payer: 59

## 2018-06-24 NOTE — Telephone Encounter (Signed)
Cynthia [name] calls today with questions regarding increased sensitivity and pain in the feet R>L, feelings of burning in the toes, and thigh muscle pain x 1-2 days.  Wondering if this would be from the compression garments.  Advised pt that she can wear or not wear them until meeting with her endocrinologist tomorrow regarding her constantly high blood sugar and neuropathy issues.  This pain could be coming from these other issues as she tolerated bandaging and her compression well up until this point.

## 2018-06-25 ENCOUNTER — Other Ambulatory Visit: Payer: Self-pay | Admitting: Family Medicine

## 2018-06-25 ENCOUNTER — Other Ambulatory Visit (INDEPENDENT_AMBULATORY_CARE_PROVIDER_SITE_OTHER): Payer: Self-pay | Admitting: Physician Assistant

## 2018-06-25 NOTE — Telephone Encounter (Signed)
Please advise 

## 2018-06-26 ENCOUNTER — Encounter: Payer: 59 | Admitting: Rehabilitation

## 2018-06-28 ENCOUNTER — Ambulatory Visit: Payer: 59 | Admitting: Rehabilitation

## 2018-06-28 ENCOUNTER — Encounter: Payer: 59 | Admitting: Rehabilitation

## 2018-06-28 DIAGNOSIS — I890 Lymphedema, not elsewhere classified: Secondary | ICD-10-CM | POA: Diagnosis not present

## 2018-06-28 DIAGNOSIS — R262 Difficulty in walking, not elsewhere classified: Secondary | ICD-10-CM
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
## 2018-06-29 ENCOUNTER — Other Ambulatory Visit: Payer: Self-pay | Admitting: Family Medicine

## 2018-06-29 DIAGNOSIS — M797 Fibromyalgia: Secondary | ICD-10-CM

## 2018-06-29 NOTE — Telephone Encounter (Signed)
Requesting medication for muscle spasms

## 2018-07-01 ENCOUNTER — Ambulatory Visit: Payer: 59

## 2018-07-03 ENCOUNTER — Encounter: Payer: 59 | Admitting: Rehabilitation

## 2018-07-04 ENCOUNTER — Other Ambulatory Visit: Payer: Self-pay | Admitting: Family Medicine

## 2018-07-05 ENCOUNTER — Encounter: Payer: 59 | Admitting: Physical Therapy

## 2018-07-08 ENCOUNTER — Other Ambulatory Visit: Payer: Self-pay | Admitting: Family Medicine

## 2018-07-08 DIAGNOSIS — I129 Hypertensive chronic kidney disease with stage 1 through stage 4 chronic kidney disease, or unspecified chronic kidney disease: Secondary | ICD-10-CM

## 2018-07-08 NOTE — Telephone Encounter (Signed)
Pt is seen by Dr Koren ShiverSantiago Irma, please advise

## 2018-07-11 ENCOUNTER — Other Ambulatory Visit: Payer: Self-pay

## 2018-07-11 ENCOUNTER — Ambulatory Visit (INDEPENDENT_AMBULATORY_CARE_PROVIDER_SITE_OTHER): Payer: 59 | Admitting: Family Medicine

## 2018-07-11 ENCOUNTER — Encounter: Payer: Self-pay | Admitting: Family Medicine

## 2018-07-11 VITALS — BP 133/84 | HR 70 | Temp 98.4°F | Ht 65.75 in | Wt 223.2 lb

## 2018-07-11 DIAGNOSIS — I10 Essential (primary) hypertension: Secondary | ICD-10-CM

## 2018-07-11 DIAGNOSIS — I890 Lymphedema, not elsewhere classified: Secondary | ICD-10-CM | POA: Diagnosis not present

## 2018-07-11 DIAGNOSIS — R5383 Other fatigue: Secondary | ICD-10-CM | POA: Diagnosis not present

## 2018-07-11 DIAGNOSIS — E1165 Type 2 diabetes mellitus with hyperglycemia: Secondary | ICD-10-CM | POA: Diagnosis not present

## 2018-07-11 MED ORDER — LISINOPRIL 20 MG PO TABS: 20.0000 mg | ORAL_TABLET | Freq: Every day | ORAL | 3 refills | Status: DC
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
## 2018-07-11 NOTE — Patient Instructions (Signed)
° ° ° °  If you have lab work done today you will be contacted with your lab results within the next 2 weeks.  If you have not heard from us then please contact us. The fastest way to get your results is to register for My Chart. ° ° °IF you received an x-ray today, you will receive an invoice from [facility]. Please contact [facility] at [phone] with questions or concerns regarding your invoice.  ° °IF you received labwork today, you will receive an invoice from [facility]. Please contact [facility] at [phone] with questions or concerns regarding your invoice.  ° °Our billing staff will not be able to assist you with questions regarding bills from these companies. ° °You will be contacted with the lab results as soon as they are available. The fastest way to get your results is to activate your My Chart account. Instructions are located on the last page of this paperwork. If you have not heard from us regarding the results in 2 weeks, please contact this office. °  ° ° ° °

## 2018-07-26 ENCOUNTER — Ambulatory Visit (INDEPENDENT_AMBULATORY_CARE_PROVIDER_SITE_OTHER): Payer: 59

## 2018-07-26 ENCOUNTER — Encounter (HOSPITAL_COMMUNITY): Payer: Self-pay

## 2018-07-26 ENCOUNTER — Ambulatory Visit (HOSPITAL_COMMUNITY)
Admission: EM | Admit: 2018-07-26 | Discharge: 2018-07-26 | Disposition: A | Discharge status: Home / Self Care | Payer: 59 | Attending: Emergency Medicine | Admitting: Emergency Medicine

## 2018-07-26 DIAGNOSIS — S92405A Nondisplaced unspecified fracture of left great toe, initial encounter for closed fracture: Secondary | ICD-10-CM | POA: Diagnosis not present

## 2018-07-26 DIAGNOSIS — S92532A Displaced fracture of distal phalanx of left lesser toe(s), initial encounter for closed fracture: Secondary | ICD-10-CM

## 2018-07-26 MED ORDER — TRAMADOL HCL 50 MG PO TABS: 50.0000 mg | ORAL_TABLET | Freq: Four times a day (QID) | ORAL | 0 refills | Status: AC | PRN

## 2018-07-26 NOTE — ED Triage Notes (Signed)
Pt presents with foot pain from dropping 40 lb object on foot and toes.

## 2018-07-26 NOTE — Discharge Instructions (Signed)
Wear post-op shoe. Keep foot elevated to reduce swelling. ICE. Tramadol as needed for pain. Follow-up with orthopedics next week.
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
## 2018-07-30 ENCOUNTER — Encounter (INDEPENDENT_AMBULATORY_CARE_PROVIDER_SITE_OTHER): Payer: Self-pay | Admitting: Family Medicine

## 2018-07-30 ENCOUNTER — Ambulatory Visit (INDEPENDENT_AMBULATORY_CARE_PROVIDER_SITE_OTHER): Payer: 59 | Admitting: Family Medicine

## 2018-07-30 DIAGNOSIS — S92425A Nondisplaced fracture of distal phalanx of left great toe, initial encounter for closed fracture: Secondary | ICD-10-CM

## 2018-07-30 MED ORDER — ACETAMINOPHEN-CODEINE #3 300-30 MG PO TABS: 1.0000 | ORAL_TABLET | Freq: Four times a day (QID) | ORAL | 0 refills | Status: DC | PRN
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
## 2018-08-04 ENCOUNTER — Encounter: Payer: Self-pay | Admitting: Family Medicine

## 2018-08-08 ENCOUNTER — Other Ambulatory Visit: Payer: Self-pay | Admitting: *Deleted

## 2018-08-08 DIAGNOSIS — Z1239 Encounter for other screening for malignant neoplasm of breast: Secondary | ICD-10-CM

## 2018-08-11 ENCOUNTER — Other Ambulatory Visit: Payer: Self-pay | Admitting: Family Medicine

## 2018-08-11 DIAGNOSIS — E1149 Type 2 diabetes mellitus with other diabetic neurological complication: Secondary | ICD-10-CM

## 2018-08-12 ENCOUNTER — Ambulatory Visit (INDEPENDENT_AMBULATORY_CARE_PROVIDER_SITE_OTHER): Payer: 59 | Admitting: Family Medicine

## 2018-08-12 ENCOUNTER — Other Ambulatory Visit: Payer: Self-pay

## 2018-08-12 ENCOUNTER — Encounter: Payer: Self-pay | Admitting: Family Medicine

## 2018-08-12 VITALS — BP 142/78 | HR 79 | Temp 98.1°F | Resp 20

## 2018-08-12 DIAGNOSIS — I10 Essential (primary) hypertension: Secondary | ICD-10-CM

## 2018-08-12 DIAGNOSIS — Z86010 Personal history of colonic polyps: Secondary | ICD-10-CM | POA: Diagnosis not present

## 2018-08-12 DIAGNOSIS — E1165 Type 2 diabetes mellitus with hyperglycemia: Secondary | ICD-10-CM

## 2018-08-12 DIAGNOSIS — Z1211 Encounter for screening for malignant neoplasm of colon: Secondary | ICD-10-CM

## 2018-08-12 DIAGNOSIS — I890 Lymphedema, not elsewhere classified: Secondary | ICD-10-CM

## 2018-08-12 DIAGNOSIS — Z1231 Encounter for screening mammogram for malignant neoplasm of breast: Secondary | ICD-10-CM | POA: Diagnosis not present

## 2018-08-12 MED ORDER — BASAGLAR KWIKPEN 100 UNIT/ML ~~LOC~~ SOPN: 25.0000 [IU] | PEN_INJECTOR | Freq: Every day | SUBCUTANEOUS | 1 refills | Status: DC

## 2018-08-12 NOTE — Patient Instructions (Addendum)
We recommend that you schedule a mammogram for breast cancer screening. Typically, you do not need a referral to do this. Please contact a local imaging center to schedule your mammogram.  [facility] - [phone]  *ask for the Radiology Department [facility] - [phone] or [phone]  [facility] - [phone] [facility] - [phone] [facility] - [phone]  *ask for the Radiology Department [facility] - [phone]  *ask for the Radiology Department [facility] - [phone]  *ask for the Mammography Department [facility] - [phone]    If you have lab work done today you will be contacted with your lab results within the next 2 weeks.  If you have not heard from us then please contact us. The fastest way to get your results is to register for My Chart.   IF you received an x-ray today, you will receive an invoice from [facility]. Please contact [facility] at [phone] with questions or concerns regarding your invoice.   IF you received labwork today, you will receive an invoice from [facility]. Please contact [facility] at [phone] with questions or concerns regarding your invoice.   Our billing staff will not be able to assist you with questions regarding bills from these companies.  You will be contacted with the lab results as soon as they are available. The fastest way to get your results is to activate your My Chart account. Instructions are located on the last page of this paperwork. If you have not heard from us regarding the results in 2 weeks, please contact this office.     
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
## 2018-08-13 ENCOUNTER — Ambulatory Visit (INDEPENDENT_AMBULATORY_CARE_PROVIDER_SITE_OTHER): Payer: 59 | Admitting: Family Medicine

## 2018-08-13 ENCOUNTER — Ambulatory Visit (INDEPENDENT_AMBULATORY_CARE_PROVIDER_SITE_OTHER): Payer: 59

## 2018-08-13 ENCOUNTER — Encounter (INDEPENDENT_AMBULATORY_CARE_PROVIDER_SITE_OTHER): Payer: Self-pay | Admitting: Family Medicine

## 2018-08-13 VITALS — BP 142/76 | HR 60 | Ht 67.75 in | Wt 223.0 lb

## 2018-08-13 DIAGNOSIS — S92425A Nondisplaced fracture of distal phalanx of left great toe, initial encounter for closed fracture: Secondary | ICD-10-CM

## 2018-08-13 MED ORDER — TRAMADOL HCL 50 MG PO TABS: 50.0000 mg | ORAL_TABLET | Freq: Two times a day (BID) | ORAL | 0 refills | Status: DC | PRN

## 2018-08-13 MED ORDER — ACETAMINOPHEN-CODEINE #3 300-30 MG PO TABS: 1.0000 | ORAL_TABLET | Freq: Every evening | ORAL | 0 refills | Status: DC | PRN
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
## 2018-09-03 ENCOUNTER — Ambulatory Visit (INDEPENDENT_AMBULATORY_CARE_PROVIDER_SITE_OTHER): Payer: 59

## 2018-09-03 ENCOUNTER — Encounter (INDEPENDENT_AMBULATORY_CARE_PROVIDER_SITE_OTHER): Payer: Self-pay | Admitting: Family Medicine

## 2018-09-03 ENCOUNTER — Ambulatory Visit (INDEPENDENT_AMBULATORY_CARE_PROVIDER_SITE_OTHER): Payer: 59 | Admitting: Family Medicine

## 2018-09-03 DIAGNOSIS — S92425D Nondisplaced fracture of distal phalanx of left great toe, subsequent encounter for fracture with routine healing: Secondary | ICD-10-CM | POA: Diagnosis not present

## 2018-09-03 DIAGNOSIS — M25532 Pain in left wrist: Secondary | ICD-10-CM

## 2018-09-03 MED ORDER — COLCHICINE 0.6 MG PO TABS
0.6000 mg | ORAL_TABLET | Freq: Two times a day (BID) | ORAL | 6 refills | Status: DC | PRN
Start: 2018-09-03 — End: 2020-05-24
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
## 2018-09-04 ENCOUNTER — Other Ambulatory Visit: Payer: Self-pay | Admitting: Family Medicine

## 2018-09-04 DIAGNOSIS — M797 Fibromyalgia: Secondary | ICD-10-CM

## 2018-09-04 NOTE — Telephone Encounter (Signed)
Requested medication (s) are due for refill today: yes  Requested medication (s) are on the active medication list: yes  Last refill:  07/02/18  Future visit scheduled: yes  Notes to clinic:     Requested Prescriptions  Pending Prescriptions Disp Refills   tiZANidine (ZANAFLEX) 4 MG capsule [Pharmacy Med Name: TIZANIDINE HCL 4 MG CAPSULE] 90 capsule 1    Sig: TAKE 1 CAPSULE (4 MG TOTAL) BY MOUTH 3 (THREE) TIMES DAILY AS NEEDED FOR MUSCLE SPASMS.     Not Delegated - Cardiovascular:  Alpha-2 Agonists - tizanidine Failed - 09/04/2018 11:35 AM      Failed - This refill cannot be delegated      Passed - Valid encounter within last 6 months    Recent Outpatient Visits          3 weeks ago Essential hypertension, benign   [facility], Meda Coffee, MD   1 month ago Essential hypertension, benign   [facility], Meda Coffee, MD   3 months ago Uncontrolled type 2 diabetes mellitus with hyperglycemia [facility])   [facility], Meda Coffee, MD   4 months ago Uncontrolled type 2 diabetes mellitus with hyperglycemia [facility])   [facility], Meda Coffee, MD   5 months ago Uncontrolled type 2 diabetes mellitus with hyperglycemia [facility])   [facility], Meda Coffee, MD      Future Appointments            In 2 months Myles Lipps, MD [facility] at [place], [facility]

## 2018-09-13 ENCOUNTER — Other Ambulatory Visit: Payer: Self-pay | Admitting: Family Medicine

## 2018-09-19 ENCOUNTER — Ambulatory Visit: Payer: 59

## 2018-09-23 ENCOUNTER — Ambulatory Visit: Payer: 59 | Admitting: Podiatry

## 2018-10-03 ENCOUNTER — Other Ambulatory Visit: Payer: Self-pay | Admitting: Family Medicine

## 2018-10-03 DIAGNOSIS — M797 Fibromyalgia: Secondary | ICD-10-CM

## 2018-10-03 NOTE — Telephone Encounter (Signed)
Requested medication (s) are due for refill today: yes  Requested medication (s) are on the active medication list: yes    Last refill: 09/05/18  Future visit scheduled yes  11/12/18 Dr. Leretha PolSantiago  Notes to clinic:not delegated  Requested Prescriptions  Pending Prescriptions Disp Refills   tiZANidine (ZANAFLEX) 4 MG capsule [Pharmacy Med Name: TIZANIDINE HCL 4 MG CAPSULE] 90 capsule 1    Sig: TAKE 1 CAPSULE (4 MG TOTAL) BY MOUTH 3 (THREE) TIMES DAILY AS NEEDED FOR MUSCLE SPASMS.     Not Delegated - Cardiovascular:  Alpha-2 Agonists - tizanidine Failed - 10/03/2018  9:32 AM      Failed - This refill cannot be delegated      Passed - Valid encounter within last 6 months    Recent Outpatient Visits          1 month ago Essential hypertension, benign   [facility], Meda CoffeeIrma M, MD   2 months ago Essential hypertension, benign   [facility], Meda CoffeeIrma M, MD   4 months ago Uncontrolled type 2 diabetes mellitus with hyperglycemia [facility](HCC)   [facility], Meda CoffeeIrma M, MD   5 months ago Uncontrolled type 2 diabetes mellitus with hyperglycemia [facility](HCC)   [facility], Meda CoffeeIrma M, MD   6 months ago Uncontrolled type 2 diabetes mellitus with hyperglycemia [facility](HCC)   [facility], Meda CoffeeIrma M, MD      Future Appointments            In 1 month Myles LippsSantiago, Irma M, MD [facility] [place], [facility]

## 2018-10-15 NOTE — Telephone Encounter (Signed)
Pt request

## 2018-10-16 MED ORDER — TIZANIDINE HCL 4 MG PO CAPS: 4.0000 mg | ORAL_CAPSULE | Freq: Three times a day (TID) | ORAL | 1 refills | Status: DC | PRN

## 2018-10-16 NOTE — Addendum Note (Signed)
Addended by: Myles LippsSANTIAGO, Christipher Rieger M on: 10/16/2018 07:12 PM   Modules accepted: Orders

## 2018-10-17 ENCOUNTER — Telehealth: Payer: Self-pay | Admitting: Family Medicine

## 2018-10-17 DIAGNOSIS — I890 Lymphedema, not elsewhere classified: Secondary | ICD-10-CM

## 2018-10-17 NOTE — Telephone Encounter (Signed)
Message sent to Dr. Leretha PolSantiago Re: referral to PT

## 2018-10-17 NOTE — Telephone Encounter (Signed)
Please Adv   Copied from CRM [id number]. Topic: Referral - Request for Referral >> Oct 17, 2018 10:58 AM Lynne LoganHudson, Caryn D wrote: Has patient seen PCP for this complaint? Yes *If NO, is insurance requiring patient see PCP for this issue before PCP can refer them? Referral for which specialty: Physical Therapy [facility] provider/office[facility] Reason for referral: Lymphedema

## 2018-10-21 NOTE — Telephone Encounter (Signed)
Duplicate request Referral made and sent on dec 5th 2019

## 2018-10-29 ENCOUNTER — Other Ambulatory Visit: Payer: Self-pay | Admitting: Family Medicine

## 2018-10-29 DIAGNOSIS — M797 Fibromyalgia: Secondary | ICD-10-CM

## 2018-10-29 NOTE — Telephone Encounter (Signed)
Requested medication (s) are due for refill today: yes  Requested medication (s) are on the active medication list: yes  Last refill:  10/03/18  Future visit scheduled: yes  Notes to clinic: not delelgated    Requested Prescriptions  Pending Prescriptions Disp Refills   tiZANidine (ZANAFLEX) 4 MG capsule [Pharmacy Med Name: TIZANIDINE HCL 4 MG CAPSULE] 90 capsule 1    Sig: Take 1 capsule (4 mg total) by mouth 3 (three) times daily as needed for muscle spasms.     Not Delegated - Cardiovascular:  Alpha-2 Agonists - tizanidine Failed - 10/29/2018  1:50 PM      Failed - This refill cannot be delegated      Passed - Valid encounter within last 6 months    Recent Outpatient Visits          2 months ago Essential hypertension, benign   [facility], Meda CoffeeIrma M, MD   3 months ago Essential hypertension, benign   [facility], Meda CoffeeIrma M, MD   5 months ago Uncontrolled type 2 diabetes mellitus with hyperglycemia [facility](HCC)   [facility], Meda CoffeeIrma M, MD   6 months ago Uncontrolled type 2 diabetes mellitus with hyperglycemia [facility](HCC)   [facility], Meda CoffeeIrma M, MD   7 months ago Uncontrolled type 2 diabetes mellitus with hyperglycemia [facility](HCC)   [facility], Meda CoffeeIrma M, MD      Future Appointments            In 2 weeks Myles LippsSantiago, Irma M, MD [facility] [place], [facility]

## 2018-11-12 ENCOUNTER — Ambulatory Visit: Payer: 59 | Admitting: Family Medicine

## 2018-11-12 ENCOUNTER — Telehealth: Payer: Self-pay | Admitting: Family Medicine

## 2018-11-12 NOTE — Telephone Encounter (Signed)
Tried to call pt and schedule an appt from her cancelled appt this morning with Dr. Leretha PolSantiago ( Dr. Leretha PolSantiago was sick). VBM was full so no message was left. If pt calls back, please call [facility] and ask for Tanya and I can get her scheduled. Thank you!

## 2018-11-15 ENCOUNTER — Ambulatory Visit (HOSPITAL_COMMUNITY)
Admission: EM | Admit: 2018-11-15 | Discharge: 2018-11-15 | Disposition: A | Discharge status: Home / Self Care | Payer: 59 | Attending: Family Medicine | Admitting: Family Medicine

## 2018-11-15 ENCOUNTER — Other Ambulatory Visit: Payer: Self-pay

## 2018-11-15 ENCOUNTER — Encounter (HOSPITAL_COMMUNITY): Payer: Self-pay | Admitting: Emergency Medicine

## 2018-11-15 DIAGNOSIS — R519 Headache, unspecified: Secondary | ICD-10-CM

## 2018-11-15 DIAGNOSIS — J3489 Other specified disorders of nose and nasal sinuses: Secondary | ICD-10-CM

## 2018-11-15 DIAGNOSIS — R51 Headache: Secondary | ICD-10-CM | POA: Insufficient documentation

## 2018-11-15 MED ORDER — KETOROLAC TROMETHAMINE 30 MG/ML IJ SOLN
INTRAMUSCULAR | Status: AC
  Filled 2018-11-15: qty 1

## 2018-11-15 MED ORDER — FLUTICASONE PROPIONATE 50 MCG/ACT NA SUSP: 1.0000 | Freq: Every day | NASAL | 2 refills | Status: DC

## 2018-11-15 MED ORDER — KETOROLAC TROMETHAMINE 30 MG/ML IJ SOLN
30.0000 mg | Freq: Once | INTRAMUSCULAR | Status: AC
  Administered 2018-11-15: 30 mg via INTRAMUSCULAR

## 2018-11-15 MED ORDER — AMOXICILLIN-POT CLAVULANATE 875-125 MG PO TABS: 1.0000 | ORAL_TABLET | Freq: Two times a day (BID) | ORAL | 0 refills | Status: DC
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
## 2018-11-15 NOTE — ED Triage Notes (Signed)
Pt complains of facial pain and headache with sinus pressure and congestion x3 days.

## 2018-11-15 NOTE — ED Notes (Signed)
Patient able to ambulate independently  

## 2018-11-15 NOTE — Discharge Instructions (Addendum)
We will go ahead and treat you for a sinus infection. Flonase for nasal congestion and inflammation. Augmentin to treat the sinus infection Toradol given in clinic today for pain and inflammation Follow up as needed for continued or worsening symptoms

## 2019-01-03 ENCOUNTER — Other Ambulatory Visit: Payer: Self-pay | Admitting: Family Medicine

## 2019-01-03 DIAGNOSIS — I129 Hypertensive chronic kidney disease with stage 1 through stage 4 chronic kidney disease, or unspecified chronic kidney disease: Secondary | ICD-10-CM

## 2019-01-03 NOTE — Telephone Encounter (Signed)
Requested Prescriptions  Pending Prescriptions Disp Refills  . metoprolol tartrate (LOPRESSOR) 50 MG tablet [Pharmacy Med Name: METOPROLOL TARTRATE 50 MG TAB] 180 tablet 0    Sig: TAKE 1 TABLET BY MOUTH TWICE A DAY     Cardiovascular:  Beta Blockers Failed - 01/03/2019  1:18 AM      Failed - Last BP in normal range    BP Readings from Last 1 Encounters:  11/15/18 (!) 163/79         Passed - Last Heart Rate in normal range    Pulse Readings from Last 1 Encounters:  11/15/18 60         Passed - Valid encounter within last 6 months    Recent Outpatient Visits          4 months ago Essential hypertension, benign   [facility] Meda Coffee, MD   5 months ago Essential hypertension, benign   [facility], Meda Coffee, MD   7 months ago Uncontrolled type 2 diabetes mellitus with hyperglycemia [facility])   [facility] Meda Coffee, MD   8 months ago Uncontrolled type 2 diabetes mellitus with hyperglycemia [facility])   [facility] Meda Coffee, MD   9 months ago Uncontrolled type 2 diabetes mellitus with hyperglycemia [facility])   [facility], Meda Coffee, MD

## 2019-01-04 ENCOUNTER — Other Ambulatory Visit (INDEPENDENT_AMBULATORY_CARE_PROVIDER_SITE_OTHER): Payer: Self-pay | Admitting: Physician Assistant

## 2019-01-06 ENCOUNTER — Other Ambulatory Visit: Payer: Self-pay | Admitting: Family Medicine

## 2019-01-06 NOTE — Telephone Encounter (Signed)
Please advise 

## 2019-01-06 NOTE — Telephone Encounter (Signed)
Patient has appointment tomorrow- can review need for refill at appointment tomorrow

## 2019-01-07 ENCOUNTER — Ambulatory Visit (INDEPENDENT_AMBULATORY_CARE_PROVIDER_SITE_OTHER): Payer: 59 | Admitting: Family Medicine

## 2019-01-07 ENCOUNTER — Other Ambulatory Visit: Payer: Self-pay

## 2019-01-07 ENCOUNTER — Encounter: Payer: Self-pay | Admitting: Family Medicine

## 2019-01-07 VITALS — BP 137/72 | HR 61 | Temp 98.0°F | Resp 16 | Ht 67.0 in | Wt 229.0 lb

## 2019-01-07 DIAGNOSIS — E1149 Type 2 diabetes mellitus with other diabetic neurological complication: Secondary | ICD-10-CM

## 2019-01-07 DIAGNOSIS — R0981 Nasal congestion: Secondary | ICD-10-CM | POA: Diagnosis not present

## 2019-01-07 DIAGNOSIS — I129 Hypertensive chronic kidney disease with stage 1 through stage 4 chronic kidney disease, or unspecified chronic kidney disease: Secondary | ICD-10-CM

## 2019-01-07 DIAGNOSIS — J029 Acute pharyngitis, unspecified: Secondary | ICD-10-CM

## 2019-01-07 DIAGNOSIS — J309 Allergic rhinitis, unspecified: Secondary | ICD-10-CM

## 2019-01-07 LAB — POCT RAPID STREP A (OFFICE): Rapid Strep A Screen: NEGATIVE

## 2019-01-07 MED ORDER — SALINE SPRAY 0.65 % NA SOLN: 1.0000 | NASAL | 0 refills | Status: DC | PRN

## 2019-01-07 MED ORDER — AMOXICILLIN-POT CLAVULANATE 875-125 MG PO TABS: 1.0000 | ORAL_TABLET | Freq: Two times a day (BID) | ORAL | 0 refills | Status: DC

## 2019-01-07 MED ORDER — LISINOPRIL 20 MG PO TABS: 20.0000 mg | ORAL_TABLET | Freq: Every day | ORAL | 3 refills | Status: DC

## 2019-01-07 MED ORDER — TRIAMCINOLONE ACETONIDE 55 MCG/ACT NA AERO: 2.0000 | INHALATION_SPRAY | Freq: Every day | NASAL | 12 refills | Status: DC

## 2019-01-07 MED ORDER — GUAIFENESIN ER 1200 MG PO TB12: 1.0000 | ORAL_TABLET | Freq: Two times a day (BID) | ORAL | 1 refills | Status: DC | PRN
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
## 2019-01-07 NOTE — Patient Instructions (Addendum)
If you have lab work done today you will be contacted with your lab results within the next 2 weeks.  If you have not heard from [place] then please contact us. The fastest way to get your results is to register for My Chart.   IF you received an x-ray today, you will receive an invoice from [facility]. Please contact [facility] at [phone] with questions or concerns regarding your invoice.   IF you received labwork today, you will receive an invoice from [facility]. Please contact [facility] at [phone] with questions or concerns regarding your invoice.   Our billing staff will not be able to assist you with questions regarding bills from these companies.  You will be contacted with the lab results as soon as they are available. The fastest way to get your results is to activate your My Chart account. Instructions are located on the last page of this paperwork. If you have not heard from [place] regarding the results in 2 weeks, please contact this office.      How to Perform a Sinus Rinse A sinus rinse is a home treatment that is used to rinse your sinuses with a sterile mixture of salt and water (saline solution). Sinuses are air-filled spaces in your skull behind the bones of your face and forehead that open into your nasal cavity. A sinus rinse can help to clear mucus, dirt, dust, or pollen from your nasal cavity. You may do a sinus rinse when you have a cold, a virus, nasal allergy symptoms, a sinus infection, or stuffiness in your nose or sinuses. Talk with your health care provider about whether a sinus rinse might help you. What are the risks? A sinus rinse is generally safe and effective. However, there are a few risks, which include:  A burning sensation in your sinuses. This may happen if you do not make the saline solution as directed. Be sure to follow all directions when making the saline solution.  Nasal irritation.  Infection from contaminated water.  This is rare, but possible. Do not do a sinus rinse if you have had ear or nasal surgery, ear infection, or blocked ears. Supplies needed:  Saline solution or powder.  Distilled or sterile water may be needed to mix with saline powder. ? You may use boiled and cooled tap water. Boil tap water for 5 minutes; cool until it is lukewarm. Use within 24 hours. ? Do not use regular tap water to mix with the saline solution.  Neti pot or nasal rinse bottle. These supplies release the saline solution into your nose and through your sinuses. Neti pots and nasal rinse bottles can be purchased at [employer], a health food store, or online. How to perform a sinus rinse  1. Wash your hands with soap and water. 2. Wash your device according to the directions that came with the product and then dry it. 3. Use the solution that comes with your product or one that is sold separately in stores. Follow the mixing directions on the package if you need to mix with sterile or distilled water. 4. Fill the device with the amount of saline solution noted in the device instructions. 5. Stand over a sink and tilt your head sideways over the sink. 6. Place the spout of the device in your upper nostril (the one closer to the ceiling). 7. Gently pour or squeeze the saline solution into your nasal cavity. The liquid should drain out from the  lower nostril if you are not too congested. 8. While rinsing, breathe through your open mouth. 9. Gently blow your nose to clear any mucus and rinse solution. Blowing too hard may cause ear pain. 10. Repeat in your other nostril. 11. Clean and rinse your device with clean water and then air-dry it. Talk with your health care provider or pharmacist if you have questions about how to do a sinus rinse. Summary  A sinus rinse is a home treatment that is used to rinse your sinuses with a sterile mixture of salt and water (saline solution).  A sinus rinse is generally safe and  effective. Follow all instructions carefully.  Before doing a sinus rinse, talk with your health care provider about whether it would be helpful for you. This information is not intended to replace advice given to you by your health care provider. Make sure you discuss any questions you have with your health care provider. Document Released: 05/27/2014 Document Revised: 08/27/2017 Document Reviewed: 08/27/2017 Elsevier Interactive Patient Education  2019 [employer] Inc[employer]  Sinusitis, Adult Sinusitis is inflammation of your sinuses. Sinuses are hollow spaces in the bones around your face. Your sinuses are located:  Around your eyes.  In the middle of your forehead.  Behind your nose.  In your cheekbones. Mucus normally drains out of your sinuses. When your nasal tissues become inflamed or swollen, mucus can become trapped or blocked. This allows bacteria, viruses, and fungi to grow, which leads to infection. Most infections of the sinuses are caused by a virus. Sinusitis can develop quickly. It can last for up to 4 weeks (acute) or for more than 12 weeks (chronic). Sinusitis often develops after a cold. What are the causes? This condition is caused by anything that creates swelling in the sinuses or stops mucus from draining. This includes:  Allergies.  Asthma.  Infection from bacteria or viruses.  Deformities or blockages in your nose or sinuses.  Abnormal growths in the nose (nasal polyps).  Pollutants, such as chemicals or irritants in the air.  Infection from fungi (rare). What increases the risk? You are more likely to develop this condition if you:  Have a weak body defense system (immune system).  Do a lot of swimming or diving.  Overuse nasal sprays.  Smoke. What are the signs or symptoms? The main symptoms of this condition are pain and a feeling of pressure around the affected sinuses. Other symptoms include:  Stuffy nose or congestion.  Thick drainage from  your nose.  Swelling and warmth over the affected sinuses.  Headache.  Upper toothache.  A cough that may get worse at night.  Extra mucus that collects in the throat or the back of the nose (postnasal drip).  Decreased sense of smell and taste.  Fatigue.  A fever.  Sore throat.  Bad breath. How is this diagnosed? This condition is diagnosed based on:  Your symptoms.  Your medical history.  A physical exam.  Tests to find out if your condition is acute or chronic. This may include: ? Checking your nose for nasal polyps. ? Viewing your sinuses using a device that has a light (endoscope). ? Testing for allergies or bacteria. ? Imaging tests, such as an MRI or CT scan. In rare cases, a bone biopsy may be done to rule out more serious types of fungal sinus disease. How is this treated? Treatment for sinusitis depends on the cause and whether your condition is chronic or acute.  If caused by a  virus, your symptoms should go away on their own within 10 days. You may be given medicines to relieve symptoms. They include: ? Medicines that shrink swollen nasal passages (topical intranasal decongestants). ? Medicines that treat allergies (antihistamines). ? A spray that eases inflammation of the nostrils (topical intranasal corticosteroids). ? Rinses that help get rid of thick mucus in your nose (nasal saline washes).  If caused by bacteria, your health care provider may recommend waiting to see if your symptoms improve. Most bacterial infections will get better without antibiotic medicine. You may be given antibiotics if you have: ? A severe infection. ? A weak immune system.  If caused by narrow nasal passages or nasal polyps, you may need to have surgery. Follow these instructions at home: Medicines  Take, use, or apply over-the-counter and prescription medicines only as told by your health care provider. These may include nasal sprays.  If you were prescribed an  antibiotic medicine, take it as told by your health care provider. Do not stop taking the antibiotic even if you start to feel better. Hydrate and humidify   Drink enough fluid to keep your urine pale yellow. Staying hydrated will help to thin your mucus.  Use a cool mist humidifier to keep the humidity level in your home above 50%.  Inhale steam for 10-15 minutes, 3-4 times a day, or as told by your health care provider. You can do this in the bathroom while a hot shower is running.  Limit your exposure to cool or dry air. Rest  Rest as much as possible.  Sleep with your head raised (elevated).  Make sure you get enough sleep each night. General instructions   Apply a warm, moist washcloth to your face 3-4 times a day or as told by your health care provider. This will help with discomfort.  Wash your hands often with soap and water to reduce your exposure to germs. If soap and water are not available, use hand sanitizer.  Do not smoke. Avoid being around people who are smoking (secondhand smoke).  Keep all follow-up visits as told by your health care provider. This is important. Contact a health care provider if:  You have a fever.  Your symptoms get worse.  Your symptoms do not improve within 10 days. Get help right away if:  You have a severe headache.  You have persistent vomiting.  You have severe pain or swelling around your face or eyes.  You have vision problems.  You develop confusion.  Your neck is stiff.  You have trouble breathing. Summary  Sinusitis is soreness and inflammation of your sinuses. Sinuses are hollow spaces in the bones around your face.  This condition is caused by nasal tissues that become inflamed or swollen. The swelling traps or blocks the flow of mucus. This allows bacteria, viruses, and fungi to grow, which leads to infection.  If you were prescribed an antibiotic medicine, take it as told by your health care provider. Do not  stop taking the antibiotic even if you start to feel better.  Keep all follow-up visits as told by your health care provider. This is important. This information is not intended to replace advice given to you by your health care provider. Make sure you discuss any questions you have with your health care provider. Document Released: 10/30/2005 Document Revised: 04/01/2018 Document Reviewed: 04/01/2018 Elsevier Interactive Patient Education  2019 [employer].

## 2019-01-23 ENCOUNTER — Other Ambulatory Visit: Payer: Self-pay | Admitting: Family Medicine

## 2019-01-23 NOTE — Telephone Encounter (Signed)
Requested medication (s) are due for refill today: yes  Requested medication (s) are on the active medication list: yes  Last refill:  Last refilled by a different provider  Future visit scheduled: No  Notes to clinic:  LOV 01/07/19. Medication previously filled by a different provider    Requested Prescriptions  Pending Prescriptions Disp Refills   nortriptyline (PAMELOR) 25 MG capsule [Pharmacy Med Name: NORTRIPTYLINE HCL 25 MG CAP] 270 capsule 4    Sig: Take one capsule in the morning and two capsules at bedtime.     There is no refill protocol information for this order

## 2019-01-27 NOTE — Telephone Encounter (Signed)
Pt wanting to see if Dr. Leretha Pol will refill this for her as she does not see her neurologist anymore. Please advise.

## 2019-03-06 ENCOUNTER — Other Ambulatory Visit: Payer: Self-pay | Admitting: Family Medicine

## 2019-04-04 ENCOUNTER — Other Ambulatory Visit: Payer: Self-pay | Admitting: Family Medicine

## 2019-04-04 NOTE — Telephone Encounter (Signed)
Requested medication (s) are due for refill today -yes  Requested medication (s) are on the active medication list -yes  Future visit scheduled yes- 04/21/19  Last refill: 01/07/19  Notes to clinic: Last refill started patient had to have visit- call to patient visit scheduled 04/21/19. Request for refill until appointment. Sent for review of request.  Requested Prescriptions  Pending Prescriptions Disp Refills   chlorthalidone (HYGROTON) 25 MG tablet [Pharmacy Med Name: CHLORTHALIDONE 25 MG TABLET] 90 tablet 0    Sig: TAKE 1 TABLET BY MOUTH EVERY DAY     Cardiovascular: Diuretics - Thiazide Failed - 04/04/2019 10:04 AM      Failed - Cr in normal range and within 360 days    Creatinine, Ser  Date Value Ref Range Status  06/13/2018 1.15 (H) 0.44 - 1.00 mg/dL Final         Passed - Ca in normal range and within 360 days    Calcium  Date Value Ref Range Status  06/13/2018 9.6 8.9 - 10.3 mg/dL Final         Passed - K in normal range and within 360 days    Potassium  Date Value Ref Range Status  06/13/2018 3.7 3.5 - 5.1 mmol/L Final         Passed - Na in normal range and within 360 days    Sodium  Date Value Ref Range Status  06/13/2018 141 135 - 145 mmol/L Final  05/31/2018 137 134 - 144 mmol/L Final         Passed - Last BP in normal range    BP Readings from Last 1 Encounters:  01/07/19 137/72         Passed - Valid encounter within last 6 months    Recent Outpatient Visits          2 months ago Sore throat   [facility], Levell JulyEva N, MD   7 months ago Essential hypertension, benign   [facility], Meda CoffeeIrma M, MD   8 months ago Essential hypertension, benign   [facility], Meda CoffeeIrma M, MD   10 months ago Uncontrolled type 2 diabetes mellitus with hyperglycemia [facility](HCC)   [facility], Meda CoffeeIrma M, MD   11 months ago Uncontrolled type 2 diabetes mellitus with hyperglycemia [facility](HCC)   [facility],  Meda CoffeeIrma M, MD      Future Appointments            In 2 weeks Myles LippsSantiago, Irma M, MD [facility]            Requested Prescriptions  Pending Prescriptions Disp Refills   chlorthalidone (HYGROTON) 25 MG tablet [Pharmacy Med Name: CHLORTHALIDONE 25 MG TABLET] 90 tablet 0    Sig: TAKE 1 TABLET BY MOUTH EVERY DAY     Cardiovascular: Diuretics - Thiazide Failed - 04/04/2019 10:04 AM      Failed - Cr in normal range and within 360 days    Creatinine, Ser  Date Value Ref Range Status  06/13/2018 1.15 (H) 0.44 - 1.00 mg/dL Final         Passed - Ca in normal range and within 360 days    Calcium  Date Value Ref Range Status  06/13/2018 9.6 8.9 - 10.3 mg/dL Final         Passed - K in normal range and within 360 days    Potassium  Date Value Ref  Range Status  06/13/2018 3.7 3.5 - 5.1 mmol/L Final         Passed - Na in normal range and within 360 days    Sodium  Date Value Ref Range Status  06/13/2018 141 135 - 145 mmol/L Final  05/31/2018 137 134 - 144 mmol/L Final         Passed - Last BP in normal range    BP Readings from Last 1 Encounters:  01/07/19 137/72         Passed - Valid encounter within last 6 months    Recent Outpatient Visits          2 months ago Sore throat   [facility], Levell July, MD   7 months ago Essential hypertension, benign   [facility], Meda Coffee, MD   8 months ago Essential hypertension, benign   [facility], Meda Coffee, MD   10 months ago Uncontrolled type 2 diabetes mellitus with hyperglycemia [facility])   [facility], Meda Coffee, MD   11 months ago Uncontrolled type 2 diabetes mellitus with hyperglycemia [facility])   [facility], Meda Coffee, MD      Future Appointments            In 2 weeks Myles Lipps, MD Primary Care at [place], [facility]

## 2019-04-05 ENCOUNTER — Other Ambulatory Visit: Payer: Self-pay | Admitting: Family Medicine

## 2019-04-05 DIAGNOSIS — I129 Hypertensive chronic kidney disease with stage 1 through stage 4 chronic kidney disease, or unspecified chronic kidney disease: Secondary | ICD-10-CM

## 2019-04-05 NOTE — Telephone Encounter (Signed)
Patient has appointment 04/21/19 Requested Prescriptions  Pending Prescriptions Disp Refills  . metoprolol tartrate (LOPRESSOR) 50 MG tablet [Pharmacy Med Name: METOPROLOL TARTRATE 50 MG TAB] 34 tablet 0    Sig: TAKE 1 TABLET BY MOUTH TWICE A DAY     Cardiovascular:  Beta Blockers Passed - 04/05/2019 11:35 AM      Passed - Last BP in normal range    BP Readings from Last 1 Encounters:  01/07/19 137/72         Passed - Last Heart Rate in normal range    Pulse Readings from Last 1 Encounters:  01/07/19 61         Passed - Valid encounter within last 6 months    Recent Outpatient Visits          2 months ago Sore throat   [facility], Levell July, MD   7 months ago Essential hypertension, benign   [facility], Meda Coffee, MD   8 months ago Essential hypertension, benign   [facility], Meda Coffee, MD   10 months ago Uncontrolled type 2 diabetes mellitus with hyperglycemia [facility])   [facility], Meda Coffee, MD   11 months ago Uncontrolled type 2 diabetes mellitus with hyperglycemia [facility])   [facility], Meda Coffee, MD      Future Appointments            In 2 weeks Myles Lipps, MD Primary Care at [place], [facility]

## 2019-04-21 ENCOUNTER — Ambulatory Visit: Payer: 59 | Admitting: Family Medicine

## 2019-05-13 ENCOUNTER — Ambulatory Visit: Payer: 59 | Admitting: Family Medicine

## 2019-05-17 ENCOUNTER — Other Ambulatory Visit: Payer: Self-pay | Admitting: Family Medicine

## 2019-05-17 DIAGNOSIS — I129 Hypertensive chronic kidney disease with stage 1 through stage 4 chronic kidney disease, or unspecified chronic kidney disease: Secondary | ICD-10-CM

## 2019-05-17 NOTE — Telephone Encounter (Signed)
Requested medication (s) are due for refill today: ye  Requested medication (s) are on the active medication list: yes  Last refill:  04/05/19  Future visit scheduled: no  Notes to clinic:  Pt was previously given courtesy refill- Pt h/o no shows and cancellations. Pt needs OV.   Requested Prescriptions  Pending Prescriptions Disp Refills   metoprolol tartrate (LOPRESSOR) 50 MG tablet [Pharmacy Med Name: METOPROLOL TARTRATE 50 MG TAB] 34 tablet 0    Sig: TAKE 1 TABLET BY MOUTH TWICE A DAY     There is no refill protocol information for this order

## 2019-06-02 ENCOUNTER — Telehealth: Payer: Self-pay

## 2019-06-02 ENCOUNTER — Telehealth (INDEPENDENT_AMBULATORY_CARE_PROVIDER_SITE_OTHER): Payer: 59 | Admitting: Family Medicine

## 2019-06-02 DIAGNOSIS — I129 Hypertensive chronic kidney disease with stage 1 through stage 4 chronic kidney disease, or unspecified chronic kidney disease: Secondary | ICD-10-CM

## 2019-06-02 DIAGNOSIS — Z1211 Encounter for screening for malignant neoplasm of colon: Secondary | ICD-10-CM

## 2019-06-02 DIAGNOSIS — M797 Fibromyalgia: Secondary | ICD-10-CM

## 2019-06-02 DIAGNOSIS — E1149 Type 2 diabetes mellitus with other diabetic neurological complication: Secondary | ICD-10-CM | POA: Diagnosis not present

## 2019-06-02 DIAGNOSIS — N183 Chronic kidney disease, stage 3 unspecified: Secondary | ICD-10-CM

## 2019-06-02 DIAGNOSIS — E785 Hyperlipidemia, unspecified: Secondary | ICD-10-CM | POA: Diagnosis not present

## 2019-06-02 DIAGNOSIS — Z86010 Personal history of colonic polyps: Secondary | ICD-10-CM

## 2019-06-02 MED ORDER — METOPROLOL TARTRATE 50 MG PO TABS: 50.0000 mg | ORAL_TABLET | Freq: Two times a day (BID) | ORAL | 1 refills | Status: DC

## 2019-06-02 MED ORDER — DULOXETINE HCL 30 MG PO CPEP: 30.0000 mg | ORAL_CAPSULE | Freq: Every day | ORAL | 3 refills | Status: DC
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
## 2019-06-02 NOTE — Telephone Encounter (Signed)
Fasting labs with appt in 4 weeks

## 2019-06-02 NOTE — Progress Notes (Signed)
Pt is needing refill on meds, not comfortable coming into the office right now due to the virus. However, she will come in for a quick lab draw. She is asking that her pcp to fill the meds for her dm and neuropathy doctors give due to copays. Labs have been pended for future draw. Pt states that she has had pap just does not remember the name of the facility or doctor she went to. She is aware that she will have to sign consent while she is here for the lab visit

## 2019-06-03 ENCOUNTER — Telehealth: Payer: Self-pay | Admitting: Family Medicine

## 2019-06-03 NOTE — Telephone Encounter (Signed)
lvm for patient to [facility]

## 2019-06-06 ENCOUNTER — Telehealth: Payer: Self-pay | Admitting: Rehabilitation

## 2019-06-06 NOTE — Telephone Encounter (Signed)
Pt called in to request what to order for bandaging her own LEs.  PT emailed supply list to patient.

## 2019-06-20 ENCOUNTER — Encounter: Payer: Self-pay | Admitting: Family Medicine

## 2019-06-20 ENCOUNTER — Other Ambulatory Visit: Payer: Self-pay

## 2019-06-20 DIAGNOSIS — Z1211 Encounter for screening for malignant neoplasm of colon: Secondary | ICD-10-CM

## 2019-06-24 ENCOUNTER — Other Ambulatory Visit: Payer: Self-pay | Admitting: Family Medicine

## 2019-06-25 ENCOUNTER — Other Ambulatory Visit: Payer: Self-pay | Admitting: Family Medicine

## 2019-06-25 ENCOUNTER — Ambulatory Visit (INDEPENDENT_AMBULATORY_CARE_PROVIDER_SITE_OTHER): Payer: 59 | Admitting: Family Medicine

## 2019-06-25 ENCOUNTER — Other Ambulatory Visit: Payer: Self-pay

## 2019-06-25 DIAGNOSIS — N183 Chronic kidney disease, stage 3 unspecified: Secondary | ICD-10-CM

## 2019-06-25 DIAGNOSIS — E1149 Type 2 diabetes mellitus with other diabetic neurological complication: Secondary | ICD-10-CM

## 2019-06-25 DIAGNOSIS — E785 Hyperlipidemia, unspecified: Secondary | ICD-10-CM

## 2019-06-26 ENCOUNTER — Other Ambulatory Visit: Payer: Self-pay | Admitting: Family Medicine

## 2019-06-26 DIAGNOSIS — N183 Chronic kidney disease, stage 3 unspecified: Secondary | ICD-10-CM

## 2019-06-26 LAB — CMP14+EGFR
ALT: 13 IU/L (ref 0–32)
AST: 16 IU/L (ref 0–40)
Albumin/Globulin Ratio: 1.4 (ref 1.2–2.2)
Albumin: 4.2 g/dL (ref 3.8–4.8)
Alkaline Phosphatase: 76 IU/L (ref 39–117)
BUN/Creatinine Ratio: 21 (ref 12–28)
BUN: 25 mg/dL (ref 8–27)
Bilirubin Total: 0.3 mg/dL (ref 0.0–1.2)
CO2: 26 mmol/L (ref 20–29)
Calcium: 10 mg/dL (ref 8.7–10.3)
Chloride: 101 mmol/L (ref 96–106)
Creatinine, Ser: 1.19 mg/dL — ABNORMAL HIGH (ref 0.57–1.00)
GFR calc Af Amer: 56 mL/min/{1.73_m2} — ABNORMAL LOW (ref >59)
GFR calc non Af Amer: 49 mL/min/{1.73_m2} — ABNORMAL LOW (ref >59)
Globulin, Total: 2.9 g/dL (ref 1.5–4.5)
Glucose: 140 mg/dL — ABNORMAL HIGH (ref 65–99)
Potassium: 4 mmol/L (ref 3.5–5.2)
Sodium: 141 mmol/L (ref 134–144)
Total Protein: 7.1 g/dL (ref 6.0–8.5)

## 2019-06-26 LAB — LIPID PANEL
Chol/HDL Ratio: 4.4 ratio (ref 0.0–4.4)
Cholesterol, Total: 245 mg/dL — ABNORMAL HIGH (ref 100–199)
HDL: 56 mg/dL (ref >39)
LDL Calculated: 155 mg/dL — ABNORMAL HIGH (ref 0–99)
Triglycerides: 170 mg/dL — ABNORMAL HIGH (ref 0–149)
VLDL Cholesterol Cal: 34 mg/dL (ref 5–40)

## 2019-06-26 LAB — VITAMIN D 25 HYDROXY (VIT D DEFICIENCY, FRACTURES): Vit D, 25-Hydroxy: 20 ng/mL — ABNORMAL LOW (ref 30.0–100.0)

## 2019-06-26 LAB — HEMOGLOBIN A1C
Est. average glucose Bld gHb Est-mCnc: 177 mg/dL
Hgb A1c MFr Bld: 7.8 % — ABNORMAL HIGH (ref 4.8–5.6)

## 2019-06-26 LAB — MICROALBUMIN / CREATININE URINE RATIO
Creatinine, Urine: 76.7 mg/dL
Microalb/Creat Ratio: 344 mg/g creat — ABNORMAL HIGH (ref 0–29)
Microalbumin, Urine: 263.7 ug/mL

## 2019-06-26 LAB — TSH: TSH: 3.12 u[IU]/mL (ref 0.450–4.500)

## 2019-06-26 MED ORDER — ATORVASTATIN CALCIUM 80 MG PO TABS: 80.0000 mg | ORAL_TABLET | Freq: Every day | ORAL | 3 refills | Status: DC

## 2019-06-26 MED ORDER — LISINOPRIL 40 MG PO TABS: 40.0000 mg | ORAL_TABLET | Freq: Every day | ORAL | 3 refills | Status: DC

## 2019-06-26 MED ORDER — VITAMIN D (ERGOCALCIFEROL) 1.25 MG (50000 UNIT) PO CAPS: 50000.0000 [IU] | ORAL_CAPSULE | ORAL | 0 refills | Status: DC

## 2019-06-26 NOTE — Addendum Note (Signed)
Addended by: Rutherford Guys on: 06/26/2019 09:30 AM   Modules accepted: Orders

## 2019-07-03 ENCOUNTER — Telehealth (INDEPENDENT_AMBULATORY_CARE_PROVIDER_SITE_OTHER): Payer: 59 | Admitting: Family Medicine

## 2019-07-03 ENCOUNTER — Telehealth: Payer: Self-pay | Admitting: Family Medicine

## 2019-07-03 ENCOUNTER — Other Ambulatory Visit: Payer: Self-pay

## 2019-07-03 DIAGNOSIS — Z794 Long term (current) use of insulin: Secondary | ICD-10-CM | POA: Diagnosis not present

## 2019-07-03 DIAGNOSIS — N183 Chronic kidney disease, stage 3 unspecified: Secondary | ICD-10-CM

## 2019-07-03 DIAGNOSIS — M797 Fibromyalgia: Secondary | ICD-10-CM

## 2019-07-03 DIAGNOSIS — E1149 Type 2 diabetes mellitus with other diabetic neurological complication: Secondary | ICD-10-CM

## 2019-07-03 DIAGNOSIS — F3342 Major depressive disorder, recurrent, in full remission: Secondary | ICD-10-CM

## 2019-07-03 MED ORDER — NORTRIPTYLINE HCL 25 MG PO CAPS: 50.0000 mg | ORAL_CAPSULE | Freq: Two times a day (BID) | ORAL | 0 refills | Status: DC

## 2019-07-03 MED ORDER — NORTRIPTYLINE HCL 25 MG PO CAPS: ORAL_CAPSULE | ORAL | 4 refills | Status: DC

## 2019-07-03 NOTE — Telephone Encounter (Signed)
3 month f/u in November has already been made but needs lab visit a few days prior, LVM to schedule lab appt
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
## 2019-07-03 NOTE — Patient Instructions (Signed)
[facility]  Address:  [address], [place], [place] [address]  Phone: [phone]  

## 2019-07-03 NOTE — Progress Notes (Signed)
Spoke with pt about health maintenance. She will be getting the pneu 23, she says she is not to do paps anymore per gyn. She wanting to discuss labs and to see if pcp will monitor dm meds, endo is too expensive. Pt will rather do the colonoscopy, referral pending

## 2019-07-03 NOTE — Progress Notes (Signed)
3 month f/u has already been made but needs lab visit a few days prior, LVM to schedule lab appt

## 2019-07-04 ENCOUNTER — Encounter: Payer: Self-pay | Admitting: Family Medicine

## 2019-07-06 ENCOUNTER — Other Ambulatory Visit: Payer: Self-pay | Admitting: Family Medicine

## 2019-08-16 ENCOUNTER — Other Ambulatory Visit: Payer: Self-pay | Admitting: Family Medicine

## 2019-08-16 DIAGNOSIS — M797 Fibromyalgia: Secondary | ICD-10-CM

## 2019-08-16 NOTE — Telephone Encounter (Signed)
Requested medication (s) are due for refill today: yes  Requested medication (s) are on the active medication list: yes  Last refill:  07/23/2019  Future visit scheduled: no  Notes to clinic:  Refill cannot be delegated    Requested Prescriptions  Pending Prescriptions Disp Refills   tiZANidine (ZANAFLEX) 4 MG capsule [Pharmacy Med Name: TIZANIDINE HCL 4 MG CAPSULE] 90 capsule 1    Sig: Take 1 capsule (4 mg total) by mouth 3 (three) times daily as needed for muscle spasms.     Not Delegated - Cardiovascular:  Alpha-2 Agonists - tizanidine Failed - 08/16/2019 12:31 PM      Failed - This refill cannot be delegated      Passed - Valid encounter within last 6 months    Recent Outpatient Visits          1 month ago Type II diabetes mellitus with neurological manifestations [facility])   Primary Care at [facility], Lilia Argue, MD   1 month ago Type II diabetes mellitus with neurological manifestations [facility])   Primary Care at [facility], [place] A, MD   2 months ago Type II diabetes mellitus with neurological manifestations [facility])   Primary Care at [facility], Lilia Argue, MD   7 months ago Sore throat   Primary Care at [facility], Laurey Arrow, MD   1 year ago Essential hypertension, benign   Primary Care at [facility], Lilia Argue, MD      Future Appointments            In 1 month Rutherford Guys, MD Primary Care at [place], [facility]

## 2019-08-18 NOTE — Telephone Encounter (Signed)
Requesting medication

## 2019-09-09 ENCOUNTER — Other Ambulatory Visit: Payer: Self-pay

## 2019-09-09 ENCOUNTER — Telehealth (INDEPENDENT_AMBULATORY_CARE_PROVIDER_SITE_OTHER): Payer: 59 | Admitting: Family Medicine

## 2019-09-09 DIAGNOSIS — I129 Hypertensive chronic kidney disease with stage 1 through stage 4 chronic kidney disease, or unspecified chronic kidney disease: Secondary | ICD-10-CM | POA: Diagnosis not present

## 2019-09-09 DIAGNOSIS — G4452 New daily persistent headache (NDPH): Secondary | ICD-10-CM | POA: Diagnosis not present

## 2019-09-09 MED ORDER — BUTALBITAL-APAP-CAFFEINE 50-325-40 MG PO TABS: 1.0000 | ORAL_TABLET | Freq: Four times a day (QID) | ORAL | 0 refills | Status: DC | PRN

## 2019-09-09 MED ORDER — AMLODIPINE BESYLATE 10 MG PO TABS: 10.0000 mg | ORAL_TABLET | Freq: Every day | ORAL | 2 refills | Status: DC

## 2019-09-09 NOTE — Progress Notes (Signed)
Pt has been having headaches for the past wk. No longer taking the tylenol, not working for the pain. Says headaches are very severe. No change to meds, pharmacy verified.
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
## 2019-09-10 NOTE — Progress Notes (Signed)
Spoke with pt and scheduled 5 day f/u

## 2019-09-11 ENCOUNTER — Other Ambulatory Visit: Payer: Self-pay | Admitting: Family Medicine

## 2019-09-11 NOTE — Telephone Encounter (Signed)
Requested medication (s) are due for refill today: yes  Requested medication (s) are on the active medication list: yes  Last refill:  06/26/2019  Future visit scheduled: yes  Notes to clinic:  Refill cannot be delegated    Requested Prescriptions  Pending Prescriptions Disp Refills   Vitamin D, Ergocalciferol, (DRISDOL) 1.25 MG (50000 UT) CAPS capsule [Pharmacy Med Name: VITAMIN D2 1.25MG (50,000 UNIT)] 12 capsule 0    Sig: Take 1 capsule (50,000 Units total) by mouth every 7 (seven) days.     Endocrinology:  Vitamins - Vitamin D Supplementation Failed - 09/11/2019 10:32 AM      Failed - 50,000 IU strengths are not delegated      Failed - Phosphate in normal range and within 360 days    No results found for: PHOS       Failed - Vitamin D in normal range and within 360 days    VITD  Date Value Ref Range Status  03/14/2017 25.78 (L) 30.00 - 100.00 ng/mL Final   Vit D, 25-Hydroxy  Date Value Ref Range Status  06/25/2019 20.0 (L) 30.0 - 100.0 ng/mL Final    Comment:    Vitamin D deficiency has been defined by the Institute of Medicine and an Endocrine Society practice guideline as a level of serum 25-OH vitamin D less than 20 ng/mL (1,2). The Endocrine Society went on to further define vitamin D insufficiency as a level between 21 and 29 ng/mL (2). 1. IOM (Institute of Medicine). 2010. Dietary reference    intakes for calcium and D. [place]: The    [employer]. 2. Holick MF, [place], Bischoff-Ferrari HA, et al.    Evaluation, treatment, and prevention of vitamin D    deficiency: an Endocrine Society clinical practice    guideline. JCEM. 2011 Jul; 96(7):1911-30.          Passed - Ca in normal range and within 360 days    Calcium  Date Value Ref Range Status  06/25/2019 10.0 8.7 - 10.3 mg/dL Final         Passed - Valid encounter within last 12 months    Recent Outpatient Visits          2 days ago New daily persistent headache   Primary Care at  [facility], Lilia Argue, MD   2 months ago Type II diabetes mellitus with neurological manifestations [facility])   Primary Care at [facility], Lilia Argue, MD   2 months ago Type II diabetes mellitus with neurological manifestations [facility])   Primary Care at [facility], [place] A, MD   3 months ago Type II diabetes mellitus with neurological manifestations [facility])   Primary Care at [facility], Lilia Argue, MD   8 months ago Sore throat   Primary Care at [facility], Laurey Arrow, MD      Future Appointments            In 4 days Rutherford Guys, MD Primary Care at [place], [facility]   In 3 weeks Rutherford Guys, MD Primary Care at [place], [facility]

## 2019-09-12 ENCOUNTER — Other Ambulatory Visit: Payer: Self-pay | Admitting: Family Medicine

## 2019-09-12 DIAGNOSIS — M797 Fibromyalgia: Secondary | ICD-10-CM

## 2019-09-12 NOTE — Telephone Encounter (Signed)
Requested medication (s) are due for refill today: yes  Requested medication (s) are on the active medication list: yes  Last refill:  08/18/2019  Future visit scheduled: yes  Notes to clinic:  Refill cannot be delegated    Requested Prescriptions  Pending Prescriptions Disp Refills   tiZANidine (ZANAFLEX) 4 MG capsule [Pharmacy Med Name: TIZANIDINE HCL 4 MG CAPSULE] 90 capsule 1    Sig: TAKE 1 CAPSULE (4 MG TOTAL) BY MOUTH 3 (THREE) TIMES DAILY AS NEEDED FOR MUSCLE SPASMS.     Not Delegated - Cardiovascular:  Alpha-2 Agonists - tizanidine Failed - 09/12/2019  9:30 AM      Failed - This refill cannot be delegated      Passed - Valid encounter within last 6 months    Recent Outpatient Visits          3 days ago New daily persistent headache   Primary Care at [facility], Lilia Argue, MD   2 months ago Type II diabetes mellitus with neurological manifestations [facility])   Primary Care at [facility], Lilia Argue, MD   2 months ago Type II diabetes mellitus with neurological manifestations [facility])   Primary Care at [facility], [place] A, MD   3 months ago Type II diabetes mellitus with neurological manifestations [facility])   Primary Care at [facility], Lilia Argue, MD   8 months ago Sore throat   Primary Care at [facility], Laurey Arrow, MD      Future Appointments            In 3 days Rutherford Guys, MD Primary Care at [place], [facility]   In 2 weeks Rutherford Guys, MD Primary Care at [place], [facility]

## 2019-09-15 ENCOUNTER — Telehealth (INDEPENDENT_AMBULATORY_CARE_PROVIDER_SITE_OTHER): Payer: 59 | Admitting: Family Medicine

## 2019-09-15 ENCOUNTER — Encounter: Payer: Self-pay | Admitting: Family Medicine

## 2019-09-15 ENCOUNTER — Other Ambulatory Visit: Payer: Self-pay

## 2019-09-15 DIAGNOSIS — I129 Hypertensive chronic kidney disease with stage 1 through stage 4 chronic kidney disease, or unspecified chronic kidney disease: Secondary | ICD-10-CM | POA: Diagnosis not present

## 2019-09-15 DIAGNOSIS — G4452 New daily persistent headache (NDPH): Secondary | ICD-10-CM

## 2019-09-15 MED ORDER — METOPROLOL TARTRATE 50 MG PO TABS: 75.0000 mg | ORAL_TABLET | Freq: Two times a day (BID) | ORAL | 0 refills | Status: DC

## 2019-09-15 MED ORDER — METOPROLOL TARTRATE 50 MG PO TABS: 75.0000 mg | ORAL_TABLET | Freq: Two times a day (BID) | ORAL | Status: DC
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
## 2019-09-15 NOTE — Patient Instructions (Signed)
° ° ° °  If you have lab work done today you will be contacted with your lab results within the next 2 weeks.  If you have not heard from us then please contact us. The fastest way to get your results is to register for My Chart. ° ° °IF you received an x-ray today, you will receive an invoice from [facility]. Please contact [facility] at [phone] with questions or concerns regarding your invoice.  ° °IF you received labwork today, you will receive an invoice from [facility]. Please contact [facility] at [phone] with questions or concerns regarding your invoice.  ° °Our billing staff will not be able to assist you with questions regarding bills from these companies. ° °You will be contacted with the lab results as soon as they are available. The fastest way to get your results is to activate your My Chart account. Instructions are located on the last page of this paperwork. If you have not heard from us regarding the results in 2 weeks, please contact this office. °  ° ° ° °

## 2019-09-15 NOTE — Progress Notes (Signed)
HA F/u from last week. Med are the same HTN F/U

## 2019-09-23 ENCOUNTER — Encounter: Payer: Self-pay | Admitting: Family Medicine

## 2019-09-23 ENCOUNTER — Other Ambulatory Visit: Payer: Self-pay | Admitting: Family Medicine

## 2019-09-23 NOTE — Telephone Encounter (Signed)
Medication Refill - Medication: NOVOLOG FLEXPEN 100 UNIT/ML FlexPen    Has the patient contacted their pharmacy? Yes.   (Agent: If no, request that the patient contact the pharmacy for the refill.) (Agent: If yes, when and what did the pharmacy advise?)  Preferred Pharmacy (with phone number or street name):  [id number] [address], [place]  [place] [address]  Phone: [phone] Fax: [phone]     Agent: Please be advised that RX refills may take up to 3 business days. We ask that you follow-up with your pharmacy.

## 2019-09-23 NOTE — Telephone Encounter (Signed)
Requested medication (s) are due for refill today: yes  Requested medication (s) are on the active medication list: yes  Last refill:  06/25/2018   Future visit scheduled: yes  Notes to clinic:  Last filled by historical provider  Review for refill   Requested Prescriptions  Pending Prescriptions Disp Refills   NOVOLOG FLEXPEN 100 UNIT/ML FlexPen 15 mL 3     Endocrinology:  Diabetes - Insulins Passed - 09/23/2019  2:18 PM      Passed - HBA1C is between 0 and 7.9 and within 180 days    Hemoglobin A1C  Date Value Ref Range Status  02/11/2016 7.7  Final   Hgb A1c MFr Bld  Date Value Ref Range Status  06/25/2019 7.8 (H) 4.8 - 5.6 % Final    Comment:             Prediabetes: 5.7 - 6.4          Diabetes: >6.4          Glycemic control for adults with diabetes: <7.0          Passed - Valid encounter within last 6 months    Recent Outpatient Visits          1 week ago Hypertension, renal disease   [facility], Lilia Argue, MD   2 weeks ago New daily persistent headache   [facility], Lilia Argue, MD   2 months ago Type II diabetes mellitus with neurological manifestations [facility])   [facility], Lilia Argue, MD   3 months ago Type II diabetes mellitus with neurological manifestations [facility])   [facility] at [place], MD   3 months ago Type II diabetes mellitus with neurological manifestations [facility])   [facility], Lilia Argue, MD      Future Appointments            In 1 week Rutherford Guys, MD Primary Care at [place], [facility]

## 2019-09-24 ENCOUNTER — Other Ambulatory Visit: Payer: Self-pay | Admitting: Family Medicine

## 2019-09-24 NOTE — Telephone Encounter (Signed)
Requested medication (s) are due for refill today: yes  Requested medication (s) are on the active medication list: yes  Last refill: 06/24/2019  Future visit scheduled: yes  Notes to clinic:  Review for refill  Last filled by historical provider    Requested Prescriptions  Pending Prescriptions Disp Refills   NOVOLOG FLEXPEN 100 UNIT/ML FlexPen [Pharmacy Med Name: NOVOLOG 100 UNIT/ML FLEXPEN] 15 mL 3    Sig: DOSE UP TO 60 UNITS THREE TIMES A DAY BEFORE MEALS     Endocrinology:  Diabetes - Insulins Passed - 09/24/2019  2:27 PM      Passed - HBA1C is between 0 and 7.9 and within 180 days    Hemoglobin A1C  Date Value Ref Range Status  02/11/2016 7.7  Final   Hgb A1c MFr Bld  Date Value Ref Range Status  06/25/2019 7.8 (H) 4.8 - 5.6 % Final    Comment:             Prediabetes: 5.7 - 6.4          Diabetes: >6.4          Glycemic control for adults with diabetes: <7.0          Passed - Valid encounter within last 6 months    Recent Outpatient Visits          1 week ago Hypertension, renal disease   [facility], Lilia Argue, MD   2 weeks ago New daily persistent headache   [facility], Lilia Argue, MD   2 months ago Type II diabetes mellitus with neurological manifestations [facility])   [facility], Lilia Argue, MD   3 months ago Type II diabetes mellitus with neurological manifestations [facility])   Primary Care at [facility] [place] A, MD   3 months ago Type II diabetes mellitus with neurological manifestations [facility])   [facility], Lilia Argue, MD      Future Appointments            In 1 week Rutherford Guys, MD Primary Care at [place], [facility]

## 2019-09-25 ENCOUNTER — Other Ambulatory Visit: Payer: Self-pay | Admitting: Family Medicine

## 2019-09-26 ENCOUNTER — Encounter: Payer: 59 | Admitting: Family Medicine

## 2019-09-26 MED ORDER — NOVOLOG FLEXPEN 100 UNIT/ML ~~LOC~~ SOPN: PEN_INJECTOR | SUBCUTANEOUS | 3 refills | Status: DC

## 2019-10-02 ENCOUNTER — Ambulatory Visit: Payer: 59 | Admitting: Family Medicine

## 2019-10-13 ENCOUNTER — Ambulatory Visit: Payer: 59 | Admitting: Family Medicine

## 2019-10-15 ENCOUNTER — Other Ambulatory Visit: Payer: Self-pay | Admitting: Family Medicine

## 2019-10-15 DIAGNOSIS — M797 Fibromyalgia: Secondary | ICD-10-CM

## 2019-10-16 ENCOUNTER — Encounter: Payer: Self-pay | Admitting: Family Medicine

## 2019-10-16 ENCOUNTER — Telehealth (INDEPENDENT_AMBULATORY_CARE_PROVIDER_SITE_OTHER): Payer: 59 | Admitting: Family Medicine

## 2019-10-16 ENCOUNTER — Other Ambulatory Visit: Payer: Self-pay

## 2019-10-16 VITALS — BP 158/98

## 2019-10-16 DIAGNOSIS — I10 Essential (primary) hypertension: Secondary | ICD-10-CM | POA: Diagnosis not present

## 2019-10-16 DIAGNOSIS — N951 Menopausal and female climacteric states: Secondary | ICD-10-CM | POA: Diagnosis not present

## 2019-10-16 MED ORDER — BUTALBITAL-APAP-CAFFEINE 50-325-40 MG PO TABS: 1.0000 | ORAL_TABLET | Freq: Four times a day (QID) | ORAL | 0 refills | Status: DC | PRN

## 2019-10-16 MED ORDER — HYDRALAZINE HCL 10 MG PO TABS: 10.0000 mg | ORAL_TABLET | Freq: Three times a day (TID) | ORAL | 2 refills | Status: DC

## 2019-10-16 NOTE — Progress Notes (Signed)
CC: High blood pressure. Pt c/o frequent headaches due to elevated bp's.  No travel outside the [place] or [place] in the past 3 weeks.  Needs refill hygroton and fiorcet.
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
## 2019-10-16 NOTE — Patient Instructions (Signed)
° ° ° °  If you have lab work done today you will be contacted with your lab results within the next 2 weeks.  If you have not heard from us then please contact us. The fastest way to get your results is to register for My Chart. ° ° °IF you received an x-ray today, you will receive an invoice from [facility]. Please contact [facility] at [phone] with questions or concerns regarding your invoice.  ° °IF you received labwork today, you will receive an invoice from [facility]. Please contact [facility] at [phone] with questions or concerns regarding your invoice.  ° °Our billing staff will not be able to assist you with questions regarding bills from these companies. ° °You will be contacted with the lab results as soon as they are available. The fastest way to get your results is to activate your My Chart account. Instructions are located on the last page of this paperwork. If you have not heard from us regarding the results in 2 weeks, please contact this office. °  ° ° ° °

## 2019-11-12 ENCOUNTER — Other Ambulatory Visit: Payer: Self-pay | Admitting: Family Medicine

## 2019-11-12 DIAGNOSIS — I129 Hypertensive chronic kidney disease with stage 1 through stage 4 chronic kidney disease, or unspecified chronic kidney disease: Secondary | ICD-10-CM

## 2019-11-14 ENCOUNTER — Other Ambulatory Visit: Payer: Self-pay | Admitting: Family Medicine

## 2019-11-14 DIAGNOSIS — M797 Fibromyalgia: Secondary | ICD-10-CM

## 2019-11-19 ENCOUNTER — Other Ambulatory Visit: Payer: Self-pay | Admitting: Family Medicine

## 2019-11-19 DIAGNOSIS — M797 Fibromyalgia: Secondary | ICD-10-CM

## 2019-11-28 ENCOUNTER — Ambulatory Visit (INDEPENDENT_AMBULATORY_CARE_PROVIDER_SITE_OTHER): Payer: Managed Care, Other (non HMO)

## 2019-11-28 ENCOUNTER — Telehealth: Payer: Self-pay | Admitting: Family Medicine

## 2019-11-28 ENCOUNTER — Encounter: Payer: Self-pay | Admitting: Family Medicine

## 2019-11-28 ENCOUNTER — Other Ambulatory Visit: Payer: Self-pay

## 2019-11-28 ENCOUNTER — Ambulatory Visit (INDEPENDENT_AMBULATORY_CARE_PROVIDER_SITE_OTHER): Payer: Managed Care, Other (non HMO) | Admitting: Family Medicine

## 2019-11-28 VITALS — BP 132/82 | HR 70 | Temp 96.0°F | Wt 264.0 lb

## 2019-11-28 DIAGNOSIS — G8929 Other chronic pain: Secondary | ICD-10-CM | POA: Diagnosis not present

## 2019-11-28 DIAGNOSIS — M25562 Pain in left knee: Secondary | ICD-10-CM

## 2019-11-28 DIAGNOSIS — E785 Hyperlipidemia, unspecified: Secondary | ICD-10-CM | POA: Diagnosis not present

## 2019-11-28 DIAGNOSIS — M1732 Unilateral post-traumatic osteoarthritis, left knee: Secondary | ICD-10-CM

## 2019-11-28 DIAGNOSIS — M797 Fibromyalgia: Secondary | ICD-10-CM

## 2019-11-28 DIAGNOSIS — I129 Hypertensive chronic kidney disease with stage 1 through stage 4 chronic kidney disease, or unspecified chronic kidney disease: Secondary | ICD-10-CM | POA: Diagnosis not present

## 2019-11-28 DIAGNOSIS — E1149 Type 2 diabetes mellitus with other diabetic neurological complication: Secondary | ICD-10-CM | POA: Diagnosis not present

## 2019-11-28 DIAGNOSIS — F4321 Adjustment disorder with depressed mood: Secondary | ICD-10-CM

## 2019-11-28 LAB — POCT URINALYSIS DIP (MANUAL ENTRY)
Bilirubin, UA: NEGATIVE
Blood, UA: NEGATIVE
Glucose, UA: NEGATIVE mg/dL
Ketones, POC UA: NEGATIVE mg/dL
Nitrite, UA: NEGATIVE
Protein Ur, POC: 100 mg/dL — AB
Spec Grav, UA: 1.02 (ref 1.010–1.025)
Urobilinogen, UA: 0.2 E.U./dL
pH, UA: 5.5 (ref 5.0–8.0)

## 2019-11-28 MED ORDER — TRAMADOL HCL 50 MG PO TABS: 50.0000 mg | ORAL_TABLET | Freq: Three times a day (TID) | ORAL | 0 refills | Status: AC | PRN

## 2019-11-28 MED ORDER — TIZANIDINE HCL 4 MG PO CAPS: 4.0000 mg | ORAL_CAPSULE | Freq: Three times a day (TID) | ORAL | 5 refills | Status: DC | PRN

## 2019-11-28 NOTE — Telephone Encounter (Signed)
Please advise 

## 2019-11-28 NOTE — Patient Instructions (Addendum)
    For therapy -- [facility] [phone] [facility] - [phone] [facility] - [phone] [facility] - [phone] [facility], [phone] [facility]  - [phone] (do not file insurance) [facility] - [phone]  Nutritionist Megan [facility] Raynelle Fanning [facility]   If you have lab work done today you will be contacted with your lab results within the next 2 weeks.  If you have not heard from [place] then please contact us. The fastest way to get your results is to register for My Chart.   IF you received an x-ray today, you will receive an invoice from [facility]. Please contact [facility] at [phone] with questions or concerns regarding your invoice.   IF you received labwork today, you will receive an invoice from [facility]. Please contact [facility] at [phone] with questions or concerns regarding your invoice.   Our billing staff will not be able to assist you with questions regarding bills from these companies.  You will be contacted with the lab results as soon as they are available. The fastest way to get your results is to activate your My Chart account. Instructions are located on the last page of this paperwork. If you have not heard from [place] regarding the results in 2 weeks, please contact this office.

## 2019-11-28 NOTE — Telephone Encounter (Signed)
Pt called and said that she forgot to mention about getting pain medication from today's visit   Please advise
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
## 2019-11-28 NOTE — Addendum Note (Signed)
Addended by: Myles Lipps on: 11/28/2019 04:39 PM   Modules accepted: Orders

## 2019-11-29 ENCOUNTER — Encounter: Payer: Self-pay | Admitting: Family Medicine

## 2019-11-29 LAB — LIPID PANEL
Chol/HDL Ratio: 2.6 ratio (ref 0.0–4.4)
Cholesterol, Total: 189 mg/dL (ref 100–199)
HDL: 73 mg/dL (ref >39)
LDL Chol Calc (NIH): 103 mg/dL — ABNORMAL HIGH (ref 0–99)
Triglycerides: 69 mg/dL (ref 0–149)
VLDL Cholesterol Cal: 13 mg/dL (ref 5–40)

## 2019-11-29 LAB — COMPREHENSIVE METABOLIC PANEL
ALT: 17 IU/L (ref 0–32)
AST: 17 IU/L (ref 0–40)
Albumin/Globulin Ratio: 1.4 (ref 1.2–2.2)
Albumin: 4 g/dL (ref 3.8–4.8)
Alkaline Phosphatase: 77 IU/L (ref 39–117)
BUN/Creatinine Ratio: 22 (ref 12–28)
BUN: 28 mg/dL — ABNORMAL HIGH (ref 8–27)
Bilirubin Total: 0.3 mg/dL (ref 0.0–1.2)
CO2: 23 mmol/L (ref 20–29)
Calcium: 9.6 mg/dL (ref 8.7–10.3)
Chloride: 102 mmol/L (ref 96–106)
Creatinine, Ser: 1.29 mg/dL — ABNORMAL HIGH (ref 0.57–1.00)
GFR calc Af Amer: 51 mL/min/{1.73_m2} — ABNORMAL LOW (ref >59)
GFR calc non Af Amer: 44 mL/min/{1.73_m2} — ABNORMAL LOW (ref >59)
Globulin, Total: 2.9 g/dL (ref 1.5–4.5)
Glucose: 116 mg/dL — ABNORMAL HIGH (ref 65–99)
Potassium: 4.1 mmol/L (ref 3.5–5.2)
Sodium: 139 mmol/L (ref 134–144)
Total Protein: 6.9 g/dL (ref 6.0–8.5)

## 2019-11-29 LAB — HEMOGLOBIN A1C
Est. average glucose Bld gHb Est-mCnc: 171 mg/dL
Hgb A1c MFr Bld: 7.6 % — ABNORMAL HIGH (ref 4.8–5.6)

## 2019-12-02 ENCOUNTER — Other Ambulatory Visit: Payer: Self-pay | Admitting: Family Medicine

## 2019-12-02 ENCOUNTER — Encounter: Payer: Self-pay | Admitting: Family Medicine

## 2019-12-03 ENCOUNTER — Encounter: Payer: Self-pay | Admitting: Family Medicine

## 2019-12-05 ENCOUNTER — Encounter: Payer: Self-pay | Admitting: Family Medicine

## 2019-12-08 ENCOUNTER — Telehealth: Payer: Self-pay | Admitting: Family Medicine

## 2019-12-08 NOTE — Telephone Encounter (Signed)
Copied from CRM [id number]. Topic: General - Other >> Dec 08, 2019 12:22 PM Dalphine Handing A wrote: Patient would like a callback from Dr. Rhae Hammock nurse in regards to her mychart messages today. Please advise

## 2019-12-08 NOTE — Telephone Encounter (Signed)
Pls call pt, to see if she is ok to speak with another provider do to lab concerns

## 2019-12-09 ENCOUNTER — Encounter: Payer: Self-pay | Admitting: Family Medicine

## 2019-12-10 ENCOUNTER — Telehealth: Payer: Self-pay | Admitting: Family Medicine

## 2019-12-10 NOTE — Telephone Encounter (Signed)
[facility] does not have a psychologist on staff. We need to refer to a different facility.

## 2019-12-11 ENCOUNTER — Other Ambulatory Visit: Payer: Self-pay | Admitting: Family Medicine

## 2019-12-11 MED ORDER — VITAMIN D (ERGOCALCIFEROL) 1.25 MG (50000 UNIT) PO CAPS: 50000.0000 [IU] | ORAL_CAPSULE | ORAL | 0 refills | Status: DC

## 2019-12-11 MED ORDER — AMLODIPINE BESYLATE 10 MG PO TABS: 10.0000 mg | ORAL_TABLET | Freq: Every day | ORAL | 1 refills | Status: DC

## 2019-12-11 NOTE — Telephone Encounter (Signed)
Spoke with patient. Meds refilled.

## 2019-12-11 NOTE — Telephone Encounter (Signed)
Please advise 

## 2019-12-26 ENCOUNTER — Telehealth: Payer: Managed Care, Other (non HMO) | Admitting: Family Medicine

## 2020-01-09 ENCOUNTER — Ambulatory Visit (INDEPENDENT_AMBULATORY_CARE_PROVIDER_SITE_OTHER): Payer: 59

## 2020-01-09 ENCOUNTER — Other Ambulatory Visit: Payer: Self-pay

## 2020-01-09 ENCOUNTER — Encounter (HOSPITAL_COMMUNITY): Payer: Self-pay

## 2020-01-09 ENCOUNTER — Telehealth (INDEPENDENT_AMBULATORY_CARE_PROVIDER_SITE_OTHER): Payer: Managed Care, Other (non HMO) | Admitting: Family Medicine

## 2020-01-09 ENCOUNTER — Ambulatory Visit (HOSPITAL_COMMUNITY)
Admission: EM | Admit: 2020-01-09 | Discharge: 2020-01-09 | Disposition: A | Discharge status: Home / Self Care | Payer: 59 | Attending: Family Medicine | Admitting: Family Medicine

## 2020-01-09 DIAGNOSIS — M25552 Pain in left hip: Secondary | ICD-10-CM | POA: Diagnosis not present

## 2020-01-09 DIAGNOSIS — R1032 Left lower quadrant pain: Secondary | ICD-10-CM | POA: Diagnosis not present

## 2020-01-09 DIAGNOSIS — R1030 Lower abdominal pain, unspecified: Secondary | ICD-10-CM

## 2020-01-09 LAB — POCT URINALYSIS DIP (DEVICE)
Bilirubin Urine: NEGATIVE
Glucose, UA: NEGATIVE mg/dL
Hgb urine dipstick: NEGATIVE
Ketones, ur: NEGATIVE mg/dL
Leukocytes,Ua: NEGATIVE
Nitrite: NEGATIVE
Protein, ur: 30 mg/dL — AB
Specific Gravity, Urine: 1.015 (ref 1.005–1.030)
Urobilinogen, UA: 0.2 mg/dL (ref 0.0–1.0)
pH: 5.5 (ref 5.0–8.0)

## 2020-01-09 MED ORDER — CYCLOBENZAPRINE HCL 5 MG PO TABS: 5.0000 mg | ORAL_TABLET | Freq: Three times a day (TID) | ORAL | 0 refills | Status: DC | PRN

## 2020-01-09 MED ORDER — PREDNISONE 10 MG (21) PO TBPK: ORAL_TABLET | ORAL | 0 refills | Status: DC

## 2020-01-09 MED ORDER — HYDROCODONE-ACETAMINOPHEN 5-325 MG PO TABS: 1.0000 | ORAL_TABLET | Freq: Four times a day (QID) | ORAL | 0 refills | Status: AC | PRN
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
## 2020-01-09 NOTE — Discharge Instructions (Addendum)
Your x-ray not show anything concerning. Urine did not show any infection. As we spoke about this could be a pulled muscle in the groin area Recommend rest, apply heat to the area. Muscle relaxer as needed.  Be aware this medication will make you drowsy. Hydrocodone for severe pain as needed Prednisone taper over the next 6 days.  Take this with food. I will give you something stronger for pain to take If your symptoms continue to worsen over the next couple of days you need to go to the ER.

## 2020-01-09 NOTE — Progress Notes (Signed)
Went to urgent care today due to pain in the pelvis area, not able to walk. Did imaging, was told arthritis was found only. Nothing found with urine. Was given prednisone, hydrocodone and a muscle relaxer.Cynthia KitchenHas not began to take the meds yet. Wanted to speak to pcp first. She was told to go to the er if sx worsen.

## 2020-01-09 NOTE — ED Triage Notes (Signed)
Pt is here with pelvic pain that started yesterday, states she can hardly walk today. Pt has taken Tylenol to relieve discomfort.
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
## 2020-02-17 ENCOUNTER — Other Ambulatory Visit: Payer: Self-pay | Admitting: Family Medicine

## 2020-02-24 ENCOUNTER — Encounter: Payer: Self-pay | Admitting: Family Medicine

## 2020-02-24 DIAGNOSIS — I890 Lymphedema, not elsewhere classified: Secondary | ICD-10-CM

## 2020-02-24 NOTE — Telephone Encounter (Signed)
Hi Dr Leretha Pol, the patient wants a referral to go back to therapy at the location in [place], because she is having swelling from her feet and legs are out of control. Thank you

## 2020-02-26 ENCOUNTER — Telehealth: Payer: Self-pay | Admitting: Family Medicine

## 2020-02-26 NOTE — Telephone Encounter (Signed)
Dr Leretha Pol contacted the patient and referral has been made to outpatient rehab at [facility]. Patient will be contacted by [facility].

## 2020-03-05 ENCOUNTER — Other Ambulatory Visit: Payer: Self-pay

## 2020-03-05 ENCOUNTER — Encounter (HOSPITAL_COMMUNITY): Payer: Self-pay | Admitting: Physical Therapy

## 2020-03-05 ENCOUNTER — Encounter: Payer: Self-pay | Admitting: Family Medicine

## 2020-03-05 ENCOUNTER — Ambulatory Visit (HOSPITAL_COMMUNITY): Payer: 59 | Attending: Family Medicine | Admitting: Physical Therapy

## 2020-03-05 DIAGNOSIS — R262 Difficulty in walking, not elsewhere classified: Secondary | ICD-10-CM | POA: Insufficient documentation

## 2020-03-05 DIAGNOSIS — I890 Lymphedema, not elsewhere classified: Secondary | ICD-10-CM | POA: Diagnosis present

## 2020-03-05 NOTE — Telephone Encounter (Signed)
Patient had appointment with the rehab for lymphedema stating that she can get a pump for no charge to help maintain my legs and feet. And they might need a referral and her feet might be all the way down in about 4 weeks.  Please Advise
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
## 2020-03-08 NOTE — Telephone Encounter (Signed)
They said they would contact you and everything depends on your referral. The information is with the rehab clinic, I don't personally have that information, they said it all depends on your referral  to get the pump

## 2020-03-09 ENCOUNTER — Other Ambulatory Visit: Payer: Self-pay | Admitting: Family Medicine

## 2020-03-09 DIAGNOSIS — E1149 Type 2 diabetes mellitus with other diabetic neurological complication: Secondary | ICD-10-CM

## 2020-03-10 ENCOUNTER — Ambulatory Visit (HOSPITAL_COMMUNITY): Payer: 59 | Admitting: Physical Therapy

## 2020-03-10 ENCOUNTER — Encounter (HOSPITAL_COMMUNITY): Payer: Self-pay

## 2020-03-10 ENCOUNTER — Other Ambulatory Visit: Payer: Self-pay

## 2020-03-10 DIAGNOSIS — I890 Lymphedema, not elsewhere classified: Secondary | ICD-10-CM | POA: Diagnosis not present

## 2020-03-10 DIAGNOSIS — R262 Difficulty in walking, not elsewhere classified: Secondary | ICD-10-CM
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
## 2020-03-11 ENCOUNTER — Ambulatory Visit (HOSPITAL_COMMUNITY): Payer: 59 | Admitting: Physical Therapy

## 2020-03-11 NOTE — Telephone Encounter (Signed)
No action needed

## 2020-03-12 ENCOUNTER — Other Ambulatory Visit: Payer: Self-pay

## 2020-03-12 ENCOUNTER — Encounter (HOSPITAL_COMMUNITY): Payer: Self-pay | Admitting: Physical Therapy

## 2020-03-12 ENCOUNTER — Ambulatory Visit (HOSPITAL_COMMUNITY): Payer: 59 | Admitting: Physical Therapy

## 2020-03-12 DIAGNOSIS — R262 Difficulty in walking, not elsewhere classified: Secondary | ICD-10-CM

## 2020-03-12 DIAGNOSIS — I890 Lymphedema, not elsewhere classified: Secondary | ICD-10-CM | POA: Diagnosis not present
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
## 2020-03-16 ENCOUNTER — Other Ambulatory Visit: Payer: Self-pay

## 2020-03-16 ENCOUNTER — Ambulatory Visit (HOSPITAL_COMMUNITY): Payer: 59 | Attending: Family Medicine | Admitting: Physical Therapy

## 2020-03-16 DIAGNOSIS — I890 Lymphedema, not elsewhere classified: Secondary | ICD-10-CM | POA: Diagnosis not present

## 2020-03-16 DIAGNOSIS — R262 Difficulty in walking, not elsewhere classified: Secondary | ICD-10-CM | POA: Insufficient documentation
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
## 2020-03-17 ENCOUNTER — Other Ambulatory Visit: Payer: Self-pay | Admitting: Family Medicine

## 2020-03-18 ENCOUNTER — Ambulatory Visit (HOSPITAL_COMMUNITY): Payer: 59

## 2020-03-18 ENCOUNTER — Encounter (HOSPITAL_COMMUNITY): Payer: Self-pay

## 2020-03-18 ENCOUNTER — Other Ambulatory Visit: Payer: Self-pay

## 2020-03-18 DIAGNOSIS — I890 Lymphedema, not elsewhere classified: Secondary | ICD-10-CM

## 2020-03-18 DIAGNOSIS — R262 Difficulty in walking, not elsewhere classified: Secondary | ICD-10-CM
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
## 2020-03-19 ENCOUNTER — Encounter (HOSPITAL_COMMUNITY): Payer: Self-pay | Admitting: Physical Therapy

## 2020-03-19 ENCOUNTER — Ambulatory Visit (HOSPITAL_COMMUNITY): Payer: 59 | Admitting: Physical Therapy

## 2020-03-19 DIAGNOSIS — I890 Lymphedema, not elsewhere classified: Secondary | ICD-10-CM

## 2020-03-19 DIAGNOSIS — R262 Difficulty in walking, not elsewhere classified: Secondary | ICD-10-CM
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
## 2020-03-22 ENCOUNTER — Ambulatory Visit (HOSPITAL_COMMUNITY): Payer: 59 | Admitting: Physical Therapy

## 2020-03-22 ENCOUNTER — Other Ambulatory Visit: Payer: Self-pay

## 2020-03-22 DIAGNOSIS — I890 Lymphedema, not elsewhere classified: Secondary | ICD-10-CM

## 2020-03-22 DIAGNOSIS — R262 Difficulty in walking, not elsewhere classified: Secondary | ICD-10-CM
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
## 2020-03-24 ENCOUNTER — Other Ambulatory Visit: Payer: Self-pay

## 2020-03-24 ENCOUNTER — Ambulatory Visit (HOSPITAL_COMMUNITY): Payer: 59

## 2020-03-24 ENCOUNTER — Encounter (HOSPITAL_COMMUNITY): Payer: Self-pay

## 2020-03-24 DIAGNOSIS — I890 Lymphedema, not elsewhere classified: Secondary | ICD-10-CM

## 2020-03-24 DIAGNOSIS — R262 Difficulty in walking, not elsewhere classified: Secondary | ICD-10-CM
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
## 2020-03-26 ENCOUNTER — Encounter (HOSPITAL_COMMUNITY): Payer: Self-pay | Admitting: Physical Therapy

## 2020-03-26 ENCOUNTER — Other Ambulatory Visit: Payer: Self-pay

## 2020-03-26 ENCOUNTER — Ambulatory Visit (HOSPITAL_COMMUNITY): Payer: 59 | Admitting: Physical Therapy

## 2020-03-26 DIAGNOSIS — I890 Lymphedema, not elsewhere classified: Secondary | ICD-10-CM

## 2020-03-26 DIAGNOSIS — R262 Difficulty in walking, not elsewhere classified: Secondary | ICD-10-CM
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
## 2020-03-29 ENCOUNTER — Telehealth (HOSPITAL_COMMUNITY): Payer: Self-pay | Admitting: Physical Therapy

## 2020-03-29 ENCOUNTER — Ambulatory Visit (HOSPITAL_COMMUNITY): Payer: 59 | Admitting: Physical Therapy

## 2020-03-29 NOTE — Telephone Encounter (Signed)
Pt did not show for scheduled appointment.  Called and left voice message regarding missed appointment and reminder for next appointment on Wednesday at 8:30 am  Lurena Nida, PTA/CLT [phone]

## 2020-03-31 ENCOUNTER — Ambulatory Visit (HOSPITAL_COMMUNITY): Payer: 59

## 2020-03-31 ENCOUNTER — Encounter (HOSPITAL_COMMUNITY): Payer: Self-pay

## 2020-03-31 ENCOUNTER — Other Ambulatory Visit: Payer: Self-pay

## 2020-03-31 DIAGNOSIS — R262 Difficulty in walking, not elsewhere classified: Secondary | ICD-10-CM

## 2020-03-31 DIAGNOSIS — I890 Lymphedema, not elsewhere classified: Secondary | ICD-10-CM
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
## 2020-04-02 ENCOUNTER — Ambulatory Visit (HOSPITAL_COMMUNITY): Payer: 59 | Admitting: Physical Therapy

## 2020-04-02 ENCOUNTER — Other Ambulatory Visit: Payer: Self-pay

## 2020-04-02 DIAGNOSIS — I890 Lymphedema, not elsewhere classified: Secondary | ICD-10-CM | POA: Diagnosis not present

## 2020-04-02 DIAGNOSIS — R262 Difficulty in walking, not elsewhere classified: Secondary | ICD-10-CM
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
## 2020-04-04 ENCOUNTER — Other Ambulatory Visit: Payer: Self-pay | Admitting: Family Medicine

## 2020-04-04 NOTE — Telephone Encounter (Signed)
Requested medication (s) are due for refill today: yes  Requested medication (s) are on the active medication list: yes  Last refill:  09/25/19  Future visit scheduled: no  Notes to clinic:  expired 12/24/19, no refill protocol information for this order   Requested Prescriptions  Pending Prescriptions Disp Refills   nortriptyline (PAMELOR) 25 MG capsule [Pharmacy Med Name: NORTRIPTYLINE HCL 25 MG CAP] 270 capsule 4    Sig: TAKE ONE CAPSULE IN THE MORNING AND TWO CAPSULES AT BEDTIME.      There is no refill protocol information for this order

## 2020-04-05 ENCOUNTER — Encounter (HOSPITAL_COMMUNITY): Payer: Self-pay | Admitting: Physical Therapy

## 2020-04-05 ENCOUNTER — Other Ambulatory Visit: Payer: Self-pay

## 2020-04-05 ENCOUNTER — Ambulatory Visit (HOSPITAL_COMMUNITY): Payer: 59 | Admitting: Physical Therapy

## 2020-04-05 DIAGNOSIS — R262 Difficulty in walking, not elsewhere classified: Secondary | ICD-10-CM

## 2020-04-05 DIAGNOSIS — I890 Lymphedema, not elsewhere classified: Secondary | ICD-10-CM | POA: Diagnosis not present
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
## 2020-04-07 ENCOUNTER — Encounter (HOSPITAL_COMMUNITY): Payer: Self-pay

## 2020-04-07 ENCOUNTER — Ambulatory Visit (HOSPITAL_COMMUNITY): Payer: 59

## 2020-04-07 ENCOUNTER — Other Ambulatory Visit: Payer: Self-pay

## 2020-04-07 DIAGNOSIS — I890 Lymphedema, not elsewhere classified: Secondary | ICD-10-CM | POA: Diagnosis not present

## 2020-04-07 DIAGNOSIS — R262 Difficulty in walking, not elsewhere classified: Secondary | ICD-10-CM
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
## 2020-04-08 NOTE — Progress Notes (Signed)
This encounter was created in error - please disregard.

## 2020-04-09 ENCOUNTER — Other Ambulatory Visit: Payer: Self-pay

## 2020-04-09 ENCOUNTER — Ambulatory Visit (HOSPITAL_COMMUNITY): Payer: 59

## 2020-04-09 DIAGNOSIS — R262 Difficulty in walking, not elsewhere classified: Secondary | ICD-10-CM

## 2020-04-09 DIAGNOSIS — I890 Lymphedema, not elsewhere classified: Secondary | ICD-10-CM | POA: Diagnosis not present
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
## 2020-04-13 ENCOUNTER — Encounter (HOSPITAL_COMMUNITY): Payer: 59

## 2020-04-13 ENCOUNTER — Telehealth (HOSPITAL_COMMUNITY): Payer: Self-pay

## 2020-04-13 NOTE — Telephone Encounter (Signed)
L/m to cx no reason given per ML.

## 2020-04-14 ENCOUNTER — Ambulatory Visit (HOSPITAL_COMMUNITY): Payer: 59 | Attending: Family Medicine | Admitting: Physical Therapy

## 2020-04-14 ENCOUNTER — Other Ambulatory Visit: Payer: Self-pay

## 2020-04-14 DIAGNOSIS — R262 Difficulty in walking, not elsewhere classified: Secondary | ICD-10-CM | POA: Insufficient documentation

## 2020-04-14 DIAGNOSIS — I890 Lymphedema, not elsewhere classified: Secondary | ICD-10-CM | POA: Diagnosis present
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
## 2020-04-16 ENCOUNTER — Telehealth (HOSPITAL_COMMUNITY): Payer: Self-pay

## 2020-04-16 ENCOUNTER — Encounter (HOSPITAL_COMMUNITY): Payer: 59

## 2020-04-16 NOTE — Telephone Encounter (Signed)
No show, called and left message concerning missed apt today.  Included next apt date and time with contact information given.    Ritika Hellickson, LPTA/CLT; CBIS [phone]  

## 2020-04-19 ENCOUNTER — Other Ambulatory Visit: Payer: Self-pay

## 2020-04-19 ENCOUNTER — Ambulatory Visit (HOSPITAL_COMMUNITY): Payer: 59 | Admitting: Physical Therapy

## 2020-04-19 DIAGNOSIS — R262 Difficulty in walking, not elsewhere classified: Secondary | ICD-10-CM

## 2020-04-19 DIAGNOSIS — I890 Lymphedema, not elsewhere classified: Secondary | ICD-10-CM | POA: Diagnosis not present

## 2020-04-19 NOTE — Addendum Note (Signed)
Addended by: Bella Kennedy on: 04/19/2020 04:39 PM   Modules accepted: Orders
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
## 2020-04-21 ENCOUNTER — Other Ambulatory Visit: Payer: Self-pay

## 2020-04-21 ENCOUNTER — Ambulatory Visit (HOSPITAL_COMMUNITY): Payer: 59 | Admitting: Physical Therapy

## 2020-04-21 DIAGNOSIS — I890 Lymphedema, not elsewhere classified: Secondary | ICD-10-CM | POA: Diagnosis not present

## 2020-04-21 DIAGNOSIS — R262 Difficulty in walking, not elsewhere classified: Secondary | ICD-10-CM
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
## 2020-04-23 ENCOUNTER — Other Ambulatory Visit: Payer: Self-pay

## 2020-04-23 ENCOUNTER — Ambulatory Visit (HOSPITAL_COMMUNITY): Payer: 59 | Admitting: Physical Therapy

## 2020-04-23 DIAGNOSIS — I890 Lymphedema, not elsewhere classified: Secondary | ICD-10-CM | POA: Diagnosis not present

## 2020-04-23 DIAGNOSIS — R262 Difficulty in walking, not elsewhere classified: Secondary | ICD-10-CM
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
## 2020-04-26 ENCOUNTER — Encounter (HOSPITAL_COMMUNITY): Payer: 59 | Admitting: Physical Therapy

## 2020-04-28 ENCOUNTER — Encounter (HOSPITAL_COMMUNITY): Payer: 59 | Admitting: Physical Therapy

## 2020-04-30 ENCOUNTER — Encounter (HOSPITAL_COMMUNITY): Payer: 59 | Admitting: Physical Therapy

## 2020-05-03 ENCOUNTER — Encounter (HOSPITAL_COMMUNITY): Payer: 59 | Admitting: Physical Therapy

## 2020-05-05 ENCOUNTER — Encounter (HOSPITAL_COMMUNITY): Payer: 59 | Admitting: Physical Therapy

## 2020-05-07 ENCOUNTER — Encounter (HOSPITAL_COMMUNITY): Payer: 59 | Admitting: Physical Therapy

## 2020-05-10 ENCOUNTER — Other Ambulatory Visit: Payer: Self-pay

## 2020-05-10 ENCOUNTER — Encounter (INDEPENDENT_AMBULATORY_CARE_PROVIDER_SITE_OTHER): Payer: Self-pay | Admitting: Family Medicine

## 2020-05-10 ENCOUNTER — Ambulatory Visit (INDEPENDENT_AMBULATORY_CARE_PROVIDER_SITE_OTHER): Payer: 59 | Admitting: Family Medicine

## 2020-05-10 VITALS — BP 144/76 | HR 72 | Temp 98.1°F | Ht 66.0 in | Wt 248.0 lb

## 2020-05-10 DIAGNOSIS — I10 Essential (primary) hypertension: Secondary | ICD-10-CM | POA: Diagnosis not present

## 2020-05-10 DIAGNOSIS — Z1331 Encounter for screening for depression: Secondary | ICD-10-CM

## 2020-05-10 DIAGNOSIS — E66813 Obesity, class 3: Secondary | ICD-10-CM

## 2020-05-10 DIAGNOSIS — E559 Vitamin D deficiency, unspecified: Secondary | ICD-10-CM

## 2020-05-10 DIAGNOSIS — Z9189 Other specified personal risk factors, not elsewhere classified: Secondary | ICD-10-CM | POA: Diagnosis not present

## 2020-05-10 DIAGNOSIS — R0602 Shortness of breath: Secondary | ICD-10-CM

## 2020-05-10 DIAGNOSIS — E1165 Type 2 diabetes mellitus with hyperglycemia: Secondary | ICD-10-CM

## 2020-05-10 DIAGNOSIS — Z0289 Encounter for other administrative examinations: Secondary | ICD-10-CM

## 2020-05-10 DIAGNOSIS — Z794 Long term (current) use of insulin: Secondary | ICD-10-CM

## 2020-05-10 DIAGNOSIS — R5383 Other fatigue: Secondary | ICD-10-CM

## 2020-05-10 DIAGNOSIS — Z6841 Body Mass Index (BMI) 40.0 and over, adult: Secondary | ICD-10-CM

## 2020-05-10 DIAGNOSIS — G4733 Obstructive sleep apnea (adult) (pediatric): Secondary | ICD-10-CM

## 2020-05-11 ENCOUNTER — Encounter: Payer: Self-pay | Admitting: Family Medicine

## 2020-05-11 LAB — VITAMIN B12: Vitamin B-12: 472 pg/mL (ref 232–1245)

## 2020-05-11 LAB — FOLATE: Folate: 8.2 ng/mL (ref >3.0)

## 2020-05-11 LAB — CBC WITH DIFFERENTIAL/PLATELET
Basophils Absolute: 0.1 10*3/uL (ref 0.0–0.2)
Basos: 1 %
EOS (ABSOLUTE): 0.2 10*3/uL (ref 0.0–0.4)
Eos: 3 %
Hematocrit: 35.5 % (ref 34.0–46.6)
Hemoglobin: 11.4 g/dL (ref 11.1–15.9)
Immature Grans (Abs): 0 10*3/uL (ref 0.0–0.1)
Immature Granulocytes: 0 %
Lymphocytes Absolute: 1.3 10*3/uL (ref 0.7–3.1)
Lymphs: 18 %
MCH: 27.5 pg (ref 26.6–33.0)
MCHC: 32.1 g/dL (ref 31.5–35.7)
MCV: 86 fL (ref 79–97)
Monocytes Absolute: 0.5 10*3/uL (ref 0.1–0.9)
Monocytes: 7 %
Neutrophils Absolute: 5.1 10*3/uL (ref 1.4–7.0)
Neutrophils: 71 %
Platelets: 338 10*3/uL (ref 150–450)
RBC: 4.15 x10E6/uL (ref 3.77–5.28)
RDW: 13.1 % (ref 11.7–15.4)
WBC: 7.2 10*3/uL (ref 3.4–10.8)

## 2020-05-11 LAB — MICROALBUMIN / CREATININE URINE RATIO
Creatinine, Urine: 93.9 mg/dL
Microalb/Creat Ratio: 108 mg/g creat — ABNORMAL HIGH (ref 0–29)
Microalbumin, Urine: 101.8 ug/mL

## 2020-05-11 LAB — TSH: TSH: 2.29 u[IU]/mL (ref 0.450–4.500)

## 2020-05-11 LAB — COMPREHENSIVE METABOLIC PANEL
ALT: 14 IU/L (ref 0–32)
AST: 14 IU/L (ref 0–40)
Albumin/Globulin Ratio: 1.4 (ref 1.2–2.2)
Albumin: 4.1 g/dL (ref 3.8–4.8)
Alkaline Phosphatase: 95 IU/L (ref 48–121)
BUN/Creatinine Ratio: 14 (ref 12–28)
BUN: 21 mg/dL (ref 8–27)
Bilirubin Total: 0.3 mg/dL (ref 0.0–1.2)
CO2: 27 mmol/L (ref 20–29)
Calcium: 9.6 mg/dL (ref 8.7–10.3)
Chloride: 97 mmol/L (ref 96–106)
Creatinine, Ser: 1.54 mg/dL — ABNORMAL HIGH (ref 0.57–1.00)
GFR calc Af Amer: 41 mL/min/{1.73_m2} — ABNORMAL LOW (ref >59)
GFR calc non Af Amer: 35 mL/min/{1.73_m2} — ABNORMAL LOW (ref >59)
Globulin, Total: 2.9 g/dL (ref 1.5–4.5)
Glucose: 131 mg/dL — ABNORMAL HIGH (ref 65–99)
Potassium: 4.5 mmol/L (ref 3.5–5.2)
Sodium: 138 mmol/L (ref 134–144)
Total Protein: 7 g/dL (ref 6.0–8.5)

## 2020-05-11 LAB — LIPID PANEL WITH LDL/HDL RATIO
Cholesterol, Total: 234 mg/dL — ABNORMAL HIGH (ref 100–199)
HDL: 50 mg/dL (ref >39)
LDL Chol Calc (NIH): 151 mg/dL — ABNORMAL HIGH (ref 0–99)
LDL/HDL Ratio: 3 ratio (ref 0.0–3.2)
Triglycerides: 180 mg/dL — ABNORMAL HIGH (ref 0–149)
VLDL Cholesterol Cal: 33 mg/dL (ref 5–40)

## 2020-05-11 LAB — VITAMIN D 25 HYDROXY (VIT D DEFICIENCY, FRACTURES): Vit D, 25-Hydroxy: 18.1 ng/mL — ABNORMAL LOW (ref 30.0–100.0)

## 2020-05-11 LAB — C-PEPTIDE: C-Peptide: 2 ng/mL (ref 1.1–4.4)

## 2020-05-11 LAB — HEMOGLOBIN A1C
Est. average glucose Bld gHb Est-mCnc: 223 mg/dL
Hgb A1c MFr Bld: 9.4 % — ABNORMAL HIGH (ref 4.8–5.6)

## 2020-05-11 LAB — T3: T3, Total: 76 ng/dL (ref 71–180)

## 2020-05-11 LAB — T4: T4, Total: 6.6 ug/dL (ref 4.5–12.0)

## 2020-05-12 ENCOUNTER — Other Ambulatory Visit: Payer: Self-pay | Admitting: Emergency Medicine

## 2020-05-12 DIAGNOSIS — Z794 Long term (current) use of insulin: Secondary | ICD-10-CM

## 2020-05-12 MED ORDER — ACCU-CHEK GUIDE VI STRP: ORAL_STRIP | 2 refills | Status: DC

## 2020-05-12 NOTE — Progress Notes (Signed)
Chief Complaint:   OBESITY Cynthia [name] (MR# [id number]) is a 64 y.o. female who presents for evaluation and treatment of obesity and related comorbidities. Current BMI is Body mass index is 40.03 kg/m. Adhira has been struggling with her weight for many years and has been unsuccessful in either losing weight, maintaining weight loss, or reaching her healthy weight goal.  Lucillia was told about our clinic from a friend. She is eating 1 meal per day, chicken, spinach, and bread (2 wings or 2 thighs). Raw vegetables with low calorie bread. She is drinking coffee with monk fruit sweetener.  Ronni is currently in the action stage of change and ready to dedicate time achieving and maintaining a healthier weight. Liboria is interested in becoming our patient and working on intensive lifestyle modifications including (but not limited to) diet and exercise for weight loss.  Hadar's habits were reviewed today and are as follows: Her family eats meals together, she thinks her family will eat healthier with her, her desired weight loss is 70 lbs, she has been heavy most of her life, she started gaining weight after child birth, her heaviest weight ever was 288 pounds, she has significant food cravings issues, she snacks frequently in the evenings, she skips meals frequently, she is frequently drinking liquids with calories, she frequently makes poor food choices, she has problems with excessive hunger, she has binge eating behaviors and she struggles with emotional eating.  Depression Screen Lexxi's Food and Mood (modified PHQ-9) score was 11.  Depression screen PHQ 2/9 05/10/2020  Decreased Interest 1  Down, Depressed, Hopeless 3  PHQ - 2 Score 4  Altered sleeping 0  Tired, decreased energy 3  Change in appetite 1  Feeling bad or failure about yourself  3  Trouble concentrating 0  Moving slowly or fidgety/restless 0  Suicidal thoughts 0  PHQ-9 Score 11  Difficult doing work/chores Not  difficult at all  Some recent data might be hidden   Subjective:   1. Other fatigue Ramla admits to daytime somnolence and admits to waking up still tired. Patent has a history of symptoms of daytime fatigue and morning headache. Nelson generally gets 5 or 6 hours of sleep per night, and states that she has generally restful sleep. Snoring is present. Apneic episodes are present. Epworth Sleepiness Score is 3. EKG-normal sinus rhythm at 76 BPM, poor R wave progression.  2. SOB (shortness of breath) on exertion Alannah notes increasing shortness of breath with exercising and seems to be worsening over time with weight gain. She notes getting out of breath sooner with activity than she used to. This has not gotten worse recently. Lissy denies shortness of breath at rest or orthopnea.  3. Type 2 diabetes mellitus with hyperglycemia, with long-term current use of insulin (HCC) Ysidra's last A1c was 7.6. She is on metformin now and inconsistently Farxiga, Basaglar 22 units qhs, and Novolog 10-14 units. Her fasting BGs average at 190. She needs an eye exam, and she never had a foot exam.  4. Essential hypertension Aujanae was diagnosed with hypertension in her 30's. She is on lisinopril, metoprolol, amlodipine, hydralazine, and chlorthalidone. She saw Cardiology in 2012.  5. OSA (obstructive sleep apnea) Paige has CPAP but she doesn't use it. She had a sleep study a long time ago.  6. Vitamin D deficiency Jameila denies nausea, vomiting, or muscle weakness, but she notes fatigue.  7. At risk for osteoporosis Kasey is at higher risk of osteopenia  and osteoporosis due to Vitamin D deficiency.   Assessment/Plan:   1. Other fatigue Lielle does feel that her weight is causing her energy to be lower than it should be. Fatigue may be related to obesity, depression or many other causes. Labs will be ordered, and in the meanwhile, Kawehi will focus on self care including making healthy food choices, increasing  physical activity and focusing on stress reduction.  - EKG 12-Lead - CBC with Differential/Platelet - Vitamin B12 - Folate - T3 - T4 - TSH  2. SOB (shortness of breath) on exertion Jules does feel that she gets out of breath more easily that she used to when she exercises. Sharifa's shortness of breath appears to be obesity related and exercise induced. She has agreed to work on weight loss and gradually increase exercise to treat her exercise induced shortness of breath. Will continue to monitor closely.  - CBC with Differential/Platelet - Vitamin B12 - Folate - T3 - T4 - TSH  3. Type 2 diabetes mellitus with hyperglycemia, with long-term current use of insulin (HCC) Good blood sugar control is important to decrease the likelihood of diabetic complications such as nephropathy, neuropathy, limb loss, blindness, coronary artery disease, and death. Intensive lifestyle modification including diet, exercise and weight loss are the first line of treatment for diabetes. We will refill acu-check test strips.  - Hemoglobin A1c - C-peptide - Microalbumin / creatinine urine ratio  4. Essential hypertension Tierra is working on healthy weight loss and exercise to improve blood pressure control. We will watch for signs of hypotension as she continues her lifestyle modifications. Makalya was encouraged to consider going back to Cardiology.  - Comprehensive metabolic panel - Lipid Panel With LDL/HDL Ratio  5. OSA (obstructive sleep apnea) Intensive lifestyle modifications are the first line treatment for this issue. We discussed several lifestyle modifications today and she will continue to work on diet, exercise and weight loss efforts. We will continue to monitor. We will refer to Pulmonary for sleep study. Orders and follow up as documented in patient record.   Counseling  Sleep apnea is a condition in which breathing pauses or becomes shallow during sleep. This happens over and over during the  night. This disrupts your sleep and keeps your body from getting the rest that it needs, which can cause tiredness and lack of energy (fatigue) during the day.  Sleep apnea treatment: If you were given a device to open your airway while you sleep, USE IT!  Sleep hygiene:   Limit or avoid alcohol, caffeinated beverages, and cigarettes, especially close to bedtime.   Do not eat a large meal or eat spicy foods right before bedtime. This can lead to digestive discomfort that can make it hard for you to sleep.  Keep a sleep diary to help you and your health care provider figure out what could be causing your insomnia.  . Make your bedroom a dark, comfortable place where it is easy to fall asleep. ? Put up shades or blackout curtains to block light from outside. ? Use a white noise machine to block noise. ? Keep the temperature cool. . Limit screen use before bedtime. This includes: ? Watching TV. ? Using your smartphone, tablet, or computer. . Stick to a routine that includes going to bed and waking up at the same times every day and night. This can help you fall asleep faster. Consider making a quiet activity, such as reading, part of your nighttime routine. . Try to avoid taking  naps during the day so that you sleep better at night. . Get out of bed if you are still awake after 15 minutes of trying to sleep. Keep the lights down, but try reading or doing a quiet activity. When you feel sleepy, go back to bed.  - Ambulatory referral to Pulmonology  6. Vitamin D deficiency Low Vitamin D level contributes to fatigue and are associated with obesity, breast, and colon cancer.  We will check labs today. Judye will follow-up for routine testing of Vitamin D, at least 2-3 times per year to avoid over-replacement.  - VITAMIN D 25 Hydroxy (Vit-D Deficiency, Fractures)  7. Depression screening Twana had a positive depression screening. Depression is commonly associated with obesity and often results  in emotional eating behaviors. We will monitor this closely and work on CBT to help improve the non-hunger eating patterns. Referral to Psychology may be required if no improvement is seen as she continues in our clinic.  8. At risk for osteoporosis Zeinab was given approximately 15 minutes of osteoporosis prevention counseling today. Chasitee is at risk for osteopenia and osteoporosis due to her Vitamin D deficiency. She was encouraged to take her Vitamin D and follow her higher calcium diet and increase strengthening exercise to help strengthen her bones and decrease her risk of osteopenia and osteoporosis.  Repetitive spaced learning was employed today to elicit superior memory formation and behavioral change.  9. Class 3 severe obesity with serious comorbidity and body mass index (BMI) of 40.0 to 44.9 in adult, unspecified obesity type (HCC) Ameila is currently in the action stage of change and her goal is to continue with weight loss efforts. I recommend Ifrah begin the structured treatment plan as follows:  She has agreed to the Category 3 Plan.  Exercise goals: No exercise has been prescribed at this time.   Behavioral modification strategies: increasing lean protein intake, increasing vegetables, meal planning and cooking strategies, keeping healthy foods in the home and planning for success.  She was informed of the importance of frequent follow-up visits to maximize her success with intensive lifestyle modifications for her multiple health conditions. She was informed we would discuss her lab results at her next visit unless there is a critical issue that needs to be addressed sooner. Fabienne agreed to keep her next visit at the agreed upon time to discuss these results.  Objective:   Blood pressure (!) 144/76, pulse 72, temperature 98.1 F (36.7 C), temperature source Oral, height 5\' 6"  (1.676 m), weight 248 lb (112.5 kg), SpO2 98 %. Body mass index is 40.03 kg/m.  EKG: Normal sinus  rhythm, rate 76 BPM.  Indirect Calorimeter completed today shows a VO2 of 288 and a REE of 2006.  Her calculated basal metabolic rate is [id number] thus her basal metabolic rate is better than expected.  General: Cooperative, alert, well developed, in no acute distress. HEENT: Conjunctivae and lids unremarkable. Cardiovascular: Regular rhythm.  Lungs: Normal work of breathing. Neurologic: No focal deficits.   Lab Results  Component Value Date   CREATININE 1.54 (H) 05/10/2020   BUN 21 05/10/2020   NA 138 05/10/2020   K 4.5 05/10/2020   CL 97 05/10/2020   CO2 27 05/10/2020   Lab Results  Component Value Date   ALT 14 05/10/2020   AST 14 05/10/2020   ALKPHOS 95 05/10/2020   BILITOT 0.3 05/10/2020   Lab Results  Component Value Date   HGBA1C 9.4 (H) 05/10/2020   HGBA1C 7.6 (H) 11/28/2019  HGBA1C 7.8 (H) 06/25/2019   HGBA1C 9.7 (H) 05/31/2018   HGBA1C 7.9 (H) 02/21/2018   No results found for: INSULIN Lab Results  Component Value Date   TSH 2.290 05/10/2020   Lab Results  Component Value Date   CHOL 234 (H) 05/10/2020   HDL 50 05/10/2020   LDLCALC 151 (H) 05/10/2020   TRIG 180 (H) 05/10/2020   CHOLHDL 2.6 11/28/2019   Lab Results  Component Value Date   WBC 7.2 05/10/2020   HGB 11.4 05/10/2020   HCT 35.5 05/10/2020   MCV 86 05/10/2020   PLT 338 05/10/2020   Lab Results  Component Value Date   IRON 38 (L) 07/14/2011   TIBC 250 07/14/2011   FERRITIN 51 07/14/2011   Attestation Statements:   This is the patient's first visit at [facility]. The patient's NEW PATIENT PACKET was reviewed at length. Included in the packet: current and past health history, medications, allergies, ROS, gynecologic history (women only), surgical history, family history, social history, weight history, weight loss surgery history (for those that have had weight loss surgery), nutritional evaluation, mood and food questionnaire, PHQ9, Epworth questionnaire, sleep habits  questionnaire, patient life and health improvement goals questionnaire. These will all be scanned into the patient's chart under media.   During the visit, I independently reviewed the patient's EKG, bioimpedance scale results, and indirect calorimeter results. I used this information to tailor a meal plan for the patient that will help her to lose weight and will improve her obesity-related conditions going forward. I performed a medically necessary appropriate examination and/or evaluation. I discussed the assessment and treatment plan with the patient. The patient was provided an opportunity to ask questions and all were answered. The patient agreed with the plan and demonstrated an understanding of the instructions. Labs were ordered at this visit and will be reviewed at the next visit unless more critical results need to be addressed immediately. Clinical information was updated and documented in the EMR.   Time spent on visit including pre-visit chart review and post-visit care was 45 minutes.   A separate 15 minutes was spent on risk counseling (see above).     Trude Mcburney, am acting as Energy manager for [employer]. MD.  I have reviewed the above documentation for accuracy and completeness, and I agree with the above. - Katherina Mires, MD

## 2020-05-15 ENCOUNTER — Other Ambulatory Visit: Payer: Self-pay | Admitting: Family Medicine

## 2020-05-15 DIAGNOSIS — I129 Hypertensive chronic kidney disease with stage 1 through stage 4 chronic kidney disease, or unspecified chronic kidney disease: Secondary | ICD-10-CM

## 2020-05-15 NOTE — Telephone Encounter (Signed)
Requested Prescriptions  Pending Prescriptions Disp Refills  . metoprolol tartrate (LOPRESSOR) 50 MG tablet [Pharmacy Med Name: METOPROLOL TARTRATE 50 MG TAB] 180 tablet 0    Sig: TAKE 1 TABLET BY MOUTH TWICE A DAY     Cardiovascular:  Beta Blockers Failed - 05/15/2020  9:49 AM      Failed - Last BP in normal range    BP Readings from Last 1 Encounters:  05/10/20 (!) 144/76         Passed - Last Heart Rate in normal range    Pulse Readings from Last 1 Encounters:  05/10/20 72         Passed - Valid encounter within last 6 months    Recent Outpatient Visits          4 months ago Left groin pain   [facility], Meda Coffee, MD   5 months ago Type II diabetes mellitus with neurological manifestations [facility])   [facility], Meda Coffee, MD   7 months ago Hypertension, essential   [facility], Meda Coffee, MD   8 months ago Hypertension, renal disease   [facility], Meda Coffee, MD   8 months ago New daily persistent headache   [facility], Meda Coffee, MD

## 2020-05-19 ENCOUNTER — Telehealth: Payer: Self-pay | Admitting: Family Medicine

## 2020-05-19 ENCOUNTER — Telehealth (INDEPENDENT_AMBULATORY_CARE_PROVIDER_SITE_OTHER): Payer: Self-pay | Admitting: Family Medicine

## 2020-05-19 NOTE — Telephone Encounter (Signed)
I spoke with Dr Marquis Lunch, and she said do the best that you can and she will discuss at your visit next week. I hope you have a good day, Ranferi Clingan, CMA

## 2020-05-19 NOTE — Telephone Encounter (Signed)
Referral Followup °

## 2020-05-19 NOTE — Telephone Encounter (Signed)
Pt called about concerns she's having regarding her not being able to eat all of meats with her meal plan. Please advise.

## 2020-05-20 ENCOUNTER — Other Ambulatory Visit: Payer: Self-pay | Admitting: Emergency Medicine

## 2020-05-20 DIAGNOSIS — Z794 Long term (current) use of insulin: Secondary | ICD-10-CM

## 2020-05-20 MED ORDER — ACCU-CHEK GUIDE VI STRP: ORAL_STRIP | 2 refills | Status: DC

## 2020-05-24 ENCOUNTER — Ambulatory Visit (INDEPENDENT_AMBULATORY_CARE_PROVIDER_SITE_OTHER): Payer: 59 | Admitting: Family Medicine

## 2020-05-24 ENCOUNTER — Other Ambulatory Visit: Payer: Self-pay

## 2020-05-24 ENCOUNTER — Other Ambulatory Visit: Payer: Self-pay | Admitting: Family Medicine

## 2020-05-24 ENCOUNTER — Encounter (INDEPENDENT_AMBULATORY_CARE_PROVIDER_SITE_OTHER): Payer: Self-pay | Admitting: Family Medicine

## 2020-05-24 VITALS — BP 148/86 | HR 97 | Temp 98.1°F | Ht 66.0 in | Wt 245.0 lb

## 2020-05-24 DIAGNOSIS — E1165 Type 2 diabetes mellitus with hyperglycemia: Secondary | ICD-10-CM

## 2020-05-24 DIAGNOSIS — M797 Fibromyalgia: Secondary | ICD-10-CM

## 2020-05-24 DIAGNOSIS — Z9189 Other specified personal risk factors, not elsewhere classified: Secondary | ICD-10-CM | POA: Diagnosis not present

## 2020-05-24 DIAGNOSIS — E785 Hyperlipidemia, unspecified: Secondary | ICD-10-CM

## 2020-05-24 DIAGNOSIS — Z794 Long term (current) use of insulin: Secondary | ICD-10-CM

## 2020-05-24 DIAGNOSIS — E1159 Type 2 diabetes mellitus with other circulatory complications: Secondary | ICD-10-CM

## 2020-05-24 DIAGNOSIS — I152 Hypertension secondary to endocrine disorders: Secondary | ICD-10-CM

## 2020-05-24 DIAGNOSIS — E559 Vitamin D deficiency, unspecified: Secondary | ICD-10-CM | POA: Diagnosis not present

## 2020-05-24 DIAGNOSIS — I10 Essential (primary) hypertension: Secondary | ICD-10-CM

## 2020-05-24 DIAGNOSIS — Z6839 Body mass index (BMI) 39.0-39.9, adult: Secondary | ICD-10-CM

## 2020-05-24 DIAGNOSIS — E1169 Type 2 diabetes mellitus with other specified complication: Secondary | ICD-10-CM

## 2020-05-24 MED ORDER — ATORVASTATIN CALCIUM 10 MG PO TABS: 10.0000 mg | ORAL_TABLET | Freq: Every day | ORAL | 0 refills | Status: DC

## 2020-05-24 MED ORDER — DAPAGLIFLOZIN PROPANEDIOL 5 MG PO TABS: 5.0000 mg | ORAL_TABLET | Freq: Every day | ORAL | 0 refills | Status: DC

## 2020-05-24 MED ORDER — VITAMIN D (ERGOCALCIFEROL) 1.25 MG (50000 UNIT) PO CAPS: 50000.0000 [IU] | ORAL_CAPSULE | ORAL | 0 refills | Status: DC

## 2020-05-28 ENCOUNTER — Telehealth: Payer: Self-pay

## 2020-05-28 NOTE — Telephone Encounter (Signed)
Faxed form for compression services medical necessity to [phone]

## 2020-05-31 NOTE — Progress Notes (Signed)
Chief Complaint:   OBESITY Cynthia [name] is here to discuss her progress with her obesity treatment plan along with follow-up of her obesity related diagnoses. Cynthia [name] is on the Category 3 Plan and states she is following her eating plan approximately 85-90% of the time. Cynthia [name] states she is doing exercise routine 30-45 minutes 3-4 times per week.  Today's visit was #: 2 Starting weight: 248 lbs Starting date: 05/10/2020 Today's weight: 245 lbs Today's date: 05/24/2020 Total lbs lost to date: 3 Total lbs lost since last in-office visit: 3  Interim History: Cynthia [name] is feeling better than she has in years. She feels that she has more energy and has been working out daily. She is looking to have some smart balance with her eggs. She couldn't eat all the meat at dinner. She is doing [employer] cheese for snacks.  Subjective:   1. Type 2 diabetes mellitus with hyperglycemia, with long-term current use of insulin (HCC) Cynthia [name]'s fasting BGs range between 82 and 142, and she had 1 low blood sugar of 61 that required 2 sugar tablets. She couldn't remember to take Farixga daily. She stopped metformin, and she has been diabetic for 10 years.  2. Vitamin D deficiency Cynthia [name]'s last Vit D level was 18.1. (she is not on Vit D). She notes fatigue.  3. Hyperlipidemia associated with type 2 diabetes mellitus (HCC) Cynthia [name]'s last LDL was 151, HDL 50, and triglycerides 144. She is not on statin, and her 10 year ASCVD risk 31%.   4. Hypertension associated with diabetes (HCC) Cynthia [name]'s blood pressure is slightly elevated today. She denies chest pain, chest pressure, or headaches. She is on amlodipine, chlorthalidone, hydralazine, and metoprolol.  5. At risk for heart disease Cynthia [name] is at a higher than average risk for cardiovascular disease due to obesity.   Assessment/Plan:   1. Type 2 diabetes mellitus with hyperglycemia, with long-term current use of insulin (HCC) Good blood sugar control is important  to decrease the likelihood of diabetic complications such as nephropathy, neuropathy, limb loss, blindness, coronary artery disease, and death. Intensive lifestyle modification including diet, exercise and weight loss are the first line of treatment for diabetes. Jadine agreed to start Farxiga 5 mg PO q AM with no refills.  - dapagliflozin propanediol (FARXIGA) 5 MG TABS tablet; Take 1 tablet (5 mg total) by mouth daily before breakfast.  Dispense: 30 tablet; Refill: 0  2. Vitamin D deficiency Low Vitamin D level contributes to fatigue and are associated with obesity, breast, and colon cancer. Kirbie agreed to start prescription Vitamin D 50,000 IU every week with no refills. She will follow-up for routine testing of Vitamin D, at least 2-3 times per year to avoid over-replacement.  - Vitamin D, Ergocalciferol, (DRISDOL) 1.25 MG (50000 UNIT) CAPS capsule; Take 1 capsule (50,000 Units total) by mouth every 7 (seven) days.  Dispense: 4 capsule; Refill: 0  3. Hyperlipidemia associated with type 2 diabetes mellitus (HCC) Cardiovascular risk and specific lipid/LDL goals reviewed. We discussed several lifestyle modifications today. Cynthia [name] will continue to work on diet, exercise and weight loss efforts. Cynthia [name] agreed to start atorvastatin 10 mg PO daily with no refills. Orders and follow up as documented in patient record.   Counseling Intensive lifestyle modifications are the first line treatment for this issue. . Dietary changes: Increase soluble fiber. Decrease simple carbohydrates. . Exercise changes: Moderate to vigorous-intensity aerobic activity 150 minutes per week if tolerated. . Lipid-lowering medications: see documented in medical record.  - atorvastatin (  LIPITOR) 10 MG tablet; Take 1 tablet (10 mg total) by mouth daily.  Dispense: 30 tablet; Refill: 0  4. Hypertension associated with diabetes (HCC) Shikita is working on healthy weight loss and exercise to improve blood pressure control. We will  watch for signs of hypotension as she continues her lifestyle modifications. We will follow up on her blood pressure at her next appointment. Her creatinine was slightly elevated, we will repeat BMP at her next appointment.  5. At risk for heart disease Cynthia [name] was given approximately 30 minutes of coronary artery disease prevention counseling today. She is 64 y.o. female and has risk factors for heart disease including obesity. We discussed intensive lifestyle modifications today with an emphasis on specific weight loss instructions and strategies.   Repetitive spaced learning was employed today to elicit superior memory formation and behavioral change.  6. Class 2 severe obesity with serious comorbidity and body mass index (BMI) of 39.0 to 39.9 in adult, unspecified obesity type (HCC) Cynthia [name] is currently in the action stage of change. As such, her goal is to continue with weight loss efforts. She has agreed to the Category 3 Plan.   Exercise goals: As is.  Behavioral modification strategies: increasing lean protein intake, increasing vegetables, meal planning and cooking strategies, keeping healthy foods in the home and planning for success.  Cynthia [name] has agreed to follow-up with our clinic in 2 weeks. She was informed of the importance of frequent follow-up visits to maximize her success with intensive lifestyle modifications for her multiple health conditions.   Objective:   Blood pressure (!) 148/86, pulse 97, temperature 98.1 F (36.7 C), temperature source Oral, height 5\' 6"  (1.676 m), weight 245 lb (111.1 kg), SpO2 100 %. Body mass index is 39.54 kg/m.  General: Cooperative, alert, well developed, in no acute distress. HEENT: Conjunctivae and lids unremarkable. Cardiovascular: Regular rhythm.  Lungs: Normal work of breathing. Neurologic: No focal deficits.   Lab Results  Component Value Date   CREATININE 1.54 (H) 05/10/2020   BUN 21 05/10/2020   NA 138 05/10/2020   K 4.5  05/10/2020   CL 97 05/10/2020   CO2 27 05/10/2020   Lab Results  Component Value Date   ALT 14 05/10/2020   AST 14 05/10/2020   ALKPHOS 95 05/10/2020   BILITOT 0.3 05/10/2020   Lab Results  Component Value Date   HGBA1C 9.4 (H) 05/10/2020   HGBA1C 7.6 (H) 11/28/2019   HGBA1C 7.8 (H) 06/25/2019   HGBA1C 9.7 (H) 05/31/2018   HGBA1C 7.9 (H) 02/21/2018   No results found for: INSULIN Lab Results  Component Value Date   TSH 2.290 05/10/2020   Lab Results  Component Value Date   CHOL 234 (H) 05/10/2020   HDL 50 05/10/2020   LDLCALC 151 (H) 05/10/2020   TRIG 180 (H) 05/10/2020   CHOLHDL 2.6 11/28/2019   Lab Results  Component Value Date   WBC 7.2 05/10/2020   HGB 11.4 05/10/2020   HCT 35.5 05/10/2020   MCV 86 05/10/2020   PLT 338 05/10/2020   Lab Results  Component Value Date   IRON 38 (L) 07/14/2011   TIBC 250 07/14/2011   FERRITIN 51 07/14/2011   Attestation Statements:   Reviewed by clinician on day of visit: allergies, medications, problem list, medical history, surgical history, family history, social history, and previous encounter notes.   I, 07/16/2011, am acting as transcriptionist for Burt Knack, MD.  I have reviewed the above documentation for accuracy and completeness, and  I agree with the above. - Katherina Mires, MD

## 2020-06-07 ENCOUNTER — Encounter (INDEPENDENT_AMBULATORY_CARE_PROVIDER_SITE_OTHER): Payer: Self-pay | Admitting: Family Medicine

## 2020-06-07 ENCOUNTER — Ambulatory Visit (INDEPENDENT_AMBULATORY_CARE_PROVIDER_SITE_OTHER): Payer: 59 | Admitting: Family Medicine

## 2020-06-07 ENCOUNTER — Other Ambulatory Visit: Payer: Self-pay

## 2020-06-07 VITALS — BP 119/67 | HR 83 | Temp 97.6°F | Ht 66.0 in | Wt 240.0 lb

## 2020-06-07 DIAGNOSIS — E1159 Type 2 diabetes mellitus with other circulatory complications: Secondary | ICD-10-CM

## 2020-06-07 DIAGNOSIS — Z794 Long term (current) use of insulin: Secondary | ICD-10-CM

## 2020-06-07 DIAGNOSIS — Z6838 Body mass index (BMI) 38.0-38.9, adult: Secondary | ICD-10-CM

## 2020-06-07 DIAGNOSIS — E785 Hyperlipidemia, unspecified: Secondary | ICD-10-CM

## 2020-06-07 DIAGNOSIS — I152 Hypertension secondary to endocrine disorders: Secondary | ICD-10-CM

## 2020-06-07 DIAGNOSIS — I10 Essential (primary) hypertension: Secondary | ICD-10-CM

## 2020-06-07 DIAGNOSIS — E1169 Type 2 diabetes mellitus with other specified complication: Secondary | ICD-10-CM

## 2020-06-08 NOTE — Progress Notes (Signed)
Chief Complaint:   OBESITY Cynthia [name] is here to discuss her progress with her obesity treatment plan along with follow-up of her obesity related diagnoses. Cynthia [name] is on the Category 3 Plan and states she is following her eating plan approximately 90% of the time. Cynthia [name] states she is lifting weights, using resistance bands, and mall walking for 60 minutes 7 times per week.  Today's visit was #: 3 Starting weight: 248 lbs Starting date: 05/10/2020 Today's weight: 240 lbs Today's date: 06/07/2020 Total lbs lost to date: 8 lbs Total lbs lost since last in-office visit: 5 lbs  Interim History: Cynthia [name] says that the past 2 weeks have gone well.  "I like the diet.  It works!  I feel good.  I want to continue eating well.  I like [employer] ice cream bars."  She is checking blood sugars at home and feeling very well.  Subjective:   1. Hypertension associated with diabetes (HCC) Review: taking medications as instructed, no medication side effects noted, no chest pain on exertion, no dyspnea on exertion, no swelling of ankles.  At last office visit, blood pressure was 148/86.  Today, it is much improved.  She is not checking blood pressure at home.  BP Readings from Last 3 Encounters:  06/07/20 119/67  05/24/20 (!) 148/86  05/10/20 (!) 144/76   2. Type 2 diabetes mellitus with other specified complication, with long-term current use of insulin (HCC) Medications reviewed. Diabetic ROS: no polyuria or polydipsia, no chest pain, dyspnea or TIA's, no numbness, tingling or pain in extremities.  In the morning, after pizza, fasting blood sugar was 254, but other mornings, 110s on average.  She had a 79 on one morning.  She sees Dr. Joaquin Music of Endocrinology.  A1c was 9.4 about 4 weeks ago.  She started [place] at last office visit with [place].  She is tolerating it well without side effects.   Lab Results  Component Value Date   HGBA1C 9.4 (H) 05/10/2020   HGBA1C 7.6 (H) 11/28/2019   HGBA1C 7.8 (H)  06/25/2019   Lab Results  Component Value Date   MICROALBUR 15.5 (H) 03/14/2017   LDLCALC 151 (H) 05/10/2020   CREATININE 1.54 (H) 05/10/2020   3. Hyperlipidemia associated with type 2 diabetes mellitus (HCC) Cynthia [name] has hyperlipidemia and has been trying to improve her cholesterol levels with intensive lifestyle modification including a low saturated fat diet, exercise and weight loss. She denies any chest pain, claudication or myalgias.  On 05/24/2020, Dr. Lawson Radar started her on 40 mg of Lipitor daily (she had 80 mg tablets at home).  She has tolerated 40 mg well without side effects over the past 2 weeks.  Lab Results  Component Value Date   ALT 14 05/10/2020   AST 14 05/10/2020   ALKPHOS 95 05/10/2020   BILITOT 0.3 05/10/2020   Lab Results  Component Value Date   CHOL 234 (H) 05/10/2020   HDL 50 05/10/2020   LDLCALC 151 (H) 05/10/2020   TRIG 180 (H) 05/10/2020   CHOLHDL 2.6 11/28/2019   Assessment/Plan:   1. Hypertension associated with diabetes (HCC) [name] is working on healthy weight loss and exercise to improve blood pressure control. We will watch for signs of hypotension as she continues her lifestyle modifications.    2. Type 2 diabetes mellitus with other specified complication, with long-term current use of insulin (HCC) Good blood sugar control is important to decrease the likelihood of diabetic complications such as nephropathy, neuropathy, limb loss,  blindness, coronary artery disease, and death. Intensive lifestyle modification including diet, exercise and weight loss are the first line of treatment for diabetes.  contniue insulin and medications per PCP.  Eventually we will have the goal to decrease insulin levels and keep on orals.  Continue prudent nutritional plan and weight loss.  3. Hyperlipidemia associated with type 2 diabetes mellitus (HCC) Cardiovascular risk and specific lipid/LDL goals reviewed.  We discussed several lifestyle modifications today and Cynthia [name]  will continue to work on diet, exercise and weight loss efforts. Orders and follow up as documented in patient record.  Cynthia [name] states she has a whole bottle of 80 mg Lipitor.  No need for refill today.  Will continue medications and recheck LFTs and FLP in 6-8 weeks.  Continue prudent nutritional plan, weight loss as well.  Counseling Intensive lifestyle modifications are the first line treatment for this issue. . Dietary changes: Increase soluble fiber. Decrease simple carbohydrates. . Exercise changes: Moderate to vigorous-intensity aerobic activity 150 minutes per week if tolerated. . Lipid-lowering medications: see documented in medical record.  4. Class 2 severe obesity with serious comorbidity and body mass index (BMI) of 38.0 to 38.9 in adult, unspecified obesity type (HCC) [name] is currently in the action stage of change. As such, her goal is to continue with weight loss efforts. She has agreed to the Category 3 Plan.   Exercise goals: As is.  Behavioral modification strategies: increasing lean protein intake, meal planning and cooking strategies and keeping healthy foods in the home.  Cynthia [name] has agreed to follow-up with our clinic in 2 weeks. She was informed of the importance of frequent follow-up visits to maximize her success with intensive lifestyle modifications for her multiple health conditions.   Objective:   Blood pressure 119/67, pulse 83, temperature 97.6 F (36.4 C), height 5\' 6"  (1.676 m), weight (!) 240 lb (108.9 kg), SpO2 98 %. Body mass index is 38.74 kg/m.  General: Cooperative, alert, well developed, in no acute distress. HEENT: Conjunctivae and lids unremarkable. Cardiovascular: Regular rhythm.  Lungs: Normal work of breathing. Neurologic: No focal deficits.   Lab Results  Component Value Date   CREATININE 1.54 (H) 05/10/2020   BUN 21 05/10/2020   NA 138 05/10/2020   K 4.5 05/10/2020   CL 97 05/10/2020   CO2 27 05/10/2020   Lab Results  Component  Value Date   ALT 14 05/10/2020   AST 14 05/10/2020   ALKPHOS 95 05/10/2020   BILITOT 0.3 05/10/2020   Lab Results  Component Value Date   HGBA1C 9.4 (H) 05/10/2020   HGBA1C 7.6 (H) 11/28/2019   HGBA1C 7.8 (H) 06/25/2019   HGBA1C 9.7 (H) 05/31/2018   HGBA1C 7.9 (H) 02/21/2018   No results found for: INSULIN Lab Results  Component Value Date   TSH 2.290 05/10/2020   Lab Results  Component Value Date   CHOL 234 (H) 05/10/2020   HDL 50 05/10/2020   LDLCALC 151 (H) 05/10/2020   TRIG 180 (H) 05/10/2020   CHOLHDL 2.6 11/28/2019   Lab Results  Component Value Date   WBC 7.2 05/10/2020   HGB 11.4 05/10/2020   HCT 35.5 05/10/2020   MCV 86 05/10/2020   PLT 338 05/10/2020   Lab Results  Component Value Date   IRON 38 (L) 07/14/2011   TIBC 250 07/14/2011   FERRITIN 51 07/14/2011   Attestation Statements:   Reviewed by clinician on day of visit: allergies, medications, problem list, medical history, surgical history, family history, social  history, and previous encounter notes.  Time spent on visit including pre-visit chart review and post-visit care and charting was 23 minutes.   I, Insurance claims handler, CMA, am acting as Energy manager for [employer], DO.  I have reviewed the above documentation for accuracy and completeness, and I agree with the above. Thomasene Lot, DO

## 2020-06-09 ENCOUNTER — Encounter (HOSPITAL_COMMUNITY): Payer: Self-pay | Admitting: Pediatrics

## 2020-06-09 ENCOUNTER — Telehealth (INDEPENDENT_AMBULATORY_CARE_PROVIDER_SITE_OTHER): Payer: Self-pay

## 2020-06-09 ENCOUNTER — Other Ambulatory Visit: Payer: Self-pay

## 2020-06-09 ENCOUNTER — Observation Stay (HOSPITAL_COMMUNITY)
Admission: EM | Admit: 2020-06-09 | Discharge: 2020-06-10 | Disposition: A | Discharge status: Home / Self Care | Payer: 59 | Attending: Internal Medicine | Admitting: Internal Medicine

## 2020-06-09 DIAGNOSIS — I5030 Unspecified diastolic (congestive) heart failure: Secondary | ICD-10-CM | POA: Insufficient documentation

## 2020-06-09 DIAGNOSIS — Z794 Long term (current) use of insulin: Secondary | ICD-10-CM | POA: Insufficient documentation

## 2020-06-09 DIAGNOSIS — E1122 Type 2 diabetes mellitus with diabetic chronic kidney disease: Secondary | ICD-10-CM | POA: Insufficient documentation

## 2020-06-09 DIAGNOSIS — T383X5A Adverse effect of insulin and oral hypoglycemic [antidiabetic] drugs, initial encounter: Secondary | ICD-10-CM | POA: Insufficient documentation

## 2020-06-09 DIAGNOSIS — T783XXA Angioneurotic edema, initial encounter: Secondary | ICD-10-CM | POA: Diagnosis not present

## 2020-06-09 DIAGNOSIS — Z79899 Other long term (current) drug therapy: Secondary | ICD-10-CM | POA: Diagnosis not present

## 2020-06-09 DIAGNOSIS — I130 Hypertensive heart and chronic kidney disease with heart failure and stage 1 through stage 4 chronic kidney disease, or unspecified chronic kidney disease: Secondary | ICD-10-CM | POA: Diagnosis not present

## 2020-06-09 DIAGNOSIS — N183 Chronic kidney disease, stage 3 unspecified: Secondary | ICD-10-CM | POA: Diagnosis not present

## 2020-06-09 DIAGNOSIS — Z87891 Personal history of nicotine dependence: Secondary | ICD-10-CM | POA: Insufficient documentation

## 2020-06-09 DIAGNOSIS — I447 Left bundle-branch block, unspecified: Secondary | ICD-10-CM | POA: Diagnosis not present

## 2020-06-09 DIAGNOSIS — Z20822 Contact with and (suspected) exposure to covid-19: Secondary | ICD-10-CM | POA: Insufficient documentation

## 2020-06-09 DIAGNOSIS — R220 Localized swelling, mass and lump, head: Secondary | ICD-10-CM | POA: Diagnosis present

## 2020-06-09 LAB — BASIC METABOLIC PANEL
Anion gap: 11 (ref 5–15)
BUN: 40 mg/dL — ABNORMAL HIGH (ref 8–23)
CO2: 24 mmol/L (ref 22–32)
Calcium: 9.7 mg/dL (ref 8.9–10.3)
Chloride: 100 mmol/L (ref 98–111)
Creatinine, Ser: 1.71 mg/dL — ABNORMAL HIGH (ref 0.44–1.00)
GFR calc Af Amer: 36 mL/min — ABNORMAL LOW (ref >60)
GFR calc non Af Amer: 31 mL/min — ABNORMAL LOW (ref >60)
Glucose, Bld: 214 mg/dL — ABNORMAL HIGH (ref 70–99)
Potassium: 4.1 mmol/L (ref 3.5–5.1)
Sodium: 135 mmol/L (ref 135–145)

## 2020-06-09 LAB — CBC WITH DIFFERENTIAL/PLATELET
Abs Immature Granulocytes: 0.03 10*3/uL (ref 0.00–0.07)
Basophils Absolute: 0 10*3/uL (ref 0.0–0.1)
Basophils Relative: 1 %
Eosinophils Absolute: 0.3 10*3/uL (ref 0.0–0.5)
Eosinophils Relative: 4 %
HCT: 36.3 % (ref 36.0–46.0)
Hemoglobin: 10.9 g/dL — ABNORMAL LOW (ref 12.0–15.0)
Immature Granulocytes: 0 %
Lymphocytes Relative: 26 %
Lymphs Abs: 1.8 10*3/uL (ref 0.7–4.0)
MCH: 26.1 pg (ref 26.0–34.0)
MCHC: 30 g/dL (ref 30.0–36.0)
MCV: 87.1 fL (ref 80.0–100.0)
Monocytes Absolute: 0.5 10*3/uL (ref 0.1–1.0)
Monocytes Relative: 6 %
Neutro Abs: 4.4 10*3/uL (ref 1.7–7.7)
Neutrophils Relative %: 63 %
Platelets: 365 10*3/uL (ref 150–400)
RBC: 4.17 MIL/uL (ref 3.87–5.11)
RDW: 14 % (ref 11.5–15.5)
WBC: 7 10*3/uL (ref 4.0–10.5)
nRBC: 0 % (ref 0.0–0.2)

## 2020-06-09 LAB — SARS CORONAVIRUS 2 BY RT PCR (HOSPITAL ORDER, PERFORMED IN ~~LOC~~ HOSPITAL LAB): SARS Coronavirus 2: NEGATIVE

## 2020-06-09 LAB — SEDIMENTATION RATE: Sed Rate: 45 mm/hr — ABNORMAL HIGH (ref 0–22)

## 2020-06-09 LAB — GLUCOSE, CAPILLARY: Glucose-Capillary: 288 mg/dL — ABNORMAL HIGH (ref 70–99)

## 2020-06-09 LAB — C-REACTIVE PROTEIN: CRP: 2 mg/dL — ABNORMAL HIGH (ref <1.0)

## 2020-06-09 LAB — CBG MONITORING, ED: Glucose-Capillary: 273 mg/dL — ABNORMAL HIGH (ref 70–99)

## 2020-06-09 MED ORDER — SENNOSIDES-DOCUSATE SODIUM 8.6-50 MG PO TABS
1.0000 | ORAL_TABLET | Freq: Two times a day (BID) | ORAL | Status: DC
  Administered 2020-06-09 – 2020-06-10 (×2): 1 via ORAL
  Filled 2020-06-09 (×2): qty 1

## 2020-06-09 MED ORDER — ACETAMINOPHEN 650 MG RE SUPP: 650.0000 mg | Freq: Four times a day (QID) | RECTAL | Status: DC | PRN

## 2020-06-09 MED ORDER — ACETAMINOPHEN 325 MG PO TABS: 650.0000 mg | ORAL_TABLET | Freq: Four times a day (QID) | ORAL | Status: DC | PRN

## 2020-06-09 MED ORDER — EPINEPHRINE PF 1 MG/ML IJ SOLN: 0.3000 mg | Freq: Once | INTRAMUSCULAR | Status: DC | PRN

## 2020-06-09 MED ORDER — INSULIN ASPART 100 UNIT/ML ~~LOC~~ SOLN
0.0000 [IU] | Freq: Every day | SUBCUTANEOUS | Status: DC
  Administered 2020-06-09: 3 [IU] via SUBCUTANEOUS

## 2020-06-09 MED ORDER — EPINEPHRINE 0.3 MG/0.3ML IJ SOAJ
0.3000 mg | Freq: Once | INTRAMUSCULAR | Status: DC | PRN
  Filled 2020-06-09: qty 0.6

## 2020-06-09 MED ORDER — INSULIN ASPART 100 UNIT/ML ~~LOC~~ SOLN
0.0000 [IU] | Freq: Three times a day (TID) | SUBCUTANEOUS | Status: DC
  Administered 2020-06-10: 5 [IU] via SUBCUTANEOUS

## 2020-06-09 MED ORDER — ENOXAPARIN SODIUM 40 MG/0.4ML ~~LOC~~ SOLN
40.0000 mg | SUBCUTANEOUS | Status: DC
  Administered 2020-06-09: 40 mg via SUBCUTANEOUS
  Filled 2020-06-09: qty 0.4

## 2020-06-09 MED ORDER — EPINEPHRINE 1 MG/10ML IJ SOSY: 0.3000 mg | PREFILLED_SYRINGE | Freq: Once | INTRAMUSCULAR | Status: DC | PRN

## 2020-06-09 MED ORDER — POLYETHYLENE GLYCOL 3350 17 G PO PACK
17.0000 g | PACK | Freq: Two times a day (BID) | ORAL | Status: DC
  Administered 2020-06-09 – 2020-06-10 (×2): 17 g via ORAL
  Filled 2020-06-09 (×2): qty 1

## 2020-06-09 MED ORDER — METOPROLOL TARTRATE 50 MG PO TABS
50.0000 mg | ORAL_TABLET | Freq: Two times a day (BID) | ORAL | Status: DC
  Administered 2020-06-09 – 2020-06-10 (×2): 50 mg via ORAL
  Filled 2020-06-09: qty 1
  Filled 2020-06-09: qty 2

## 2020-06-09 MED ORDER — DIPHENHYDRAMINE HCL 50 MG/ML IJ SOLN
25.0000 mg | Freq: Once | INTRAMUSCULAR | Status: AC
  Administered 2020-06-09: 25 mg via INTRAVENOUS
  Filled 2020-06-09: qty 1

## 2020-06-09 MED ORDER — FAMOTIDINE IN NACL 20-0.9 MG/50ML-% IV SOLN
20.0000 mg | Freq: Once | INTRAVENOUS | Status: AC
  Administered 2020-06-09: 20 mg via INTRAVENOUS
  Filled 2020-06-09: qty 50

## 2020-06-09 MED ORDER — INSULIN GLARGINE 100 UNIT/ML ~~LOC~~ SOLN
10.0000 [IU] | Freq: Every day | SUBCUTANEOUS | Status: DC
  Administered 2020-06-10: 10 [IU] via SUBCUTANEOUS
  Filled 2020-06-09 (×4): qty 0.1

## 2020-06-09 NOTE — ED Triage Notes (Signed)
Stated swelling in jaw area this morning and now spread to lips; c/o lightheadedness and throat pain.
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
## 2020-06-09 NOTE — ED Notes (Signed)
Report called to [facility] RN

## 2020-06-09 NOTE — ED Triage Notes (Signed)
Patient stated new medications: Farciga, atorvastatin and Vit D started about a week now.
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
## 2020-06-09 NOTE — Telephone Encounter (Signed)
Patient called in stating she is experiencing swelling in her mouth and has been on [place] for one week. She was asking if this could be the cause. I informed the patient to go directly to the emergency room or call 911. Patient states her husband was at home but currently sleeping. She did state she has benadryl pills at home and would take one while on the way to the hospital. She was informed of the high risk of her throat swelling next in which will block her airway. Patient verbalized understanding of the importance of seeking emergency help ASAP.

## 2020-06-09 NOTE — Telephone Encounter (Signed)
Called patient to follow up on requesting that she go to the emergency room after her calling the office stating she was having an allergic reaction. Patient is currently at [facility] emergency room being closely monitored. Dr Dalbert Garnet made aware.   Shadaya Marschner, CMA

## 2020-06-10 ENCOUNTER — Other Ambulatory Visit (HOSPITAL_COMMUNITY): Payer: 59

## 2020-06-10 LAB — COMPREHENSIVE METABOLIC PANEL
ALT: 15 U/L (ref 0–44)
AST: 14 U/L — ABNORMAL LOW (ref 15–41)
Albumin: 3.3 g/dL — ABNORMAL LOW (ref 3.5–5.0)
Alkaline Phosphatase: 72 U/L (ref 38–126)
Anion gap: 10 (ref 5–15)
BUN: 33 mg/dL — ABNORMAL HIGH (ref 8–23)
CO2: 25 mmol/L (ref 22–32)
Calcium: 9.4 mg/dL (ref 8.9–10.3)
Chloride: 101 mmol/L (ref 98–111)
Creatinine, Ser: 1.65 mg/dL — ABNORMAL HIGH (ref 0.44–1.00)
GFR calc Af Amer: 38 mL/min — ABNORMAL LOW (ref >60)
GFR calc non Af Amer: 32 mL/min — ABNORMAL LOW (ref >60)
Glucose, Bld: 240 mg/dL — ABNORMAL HIGH (ref 70–99)
Potassium: 4.1 mmol/L (ref 3.5–5.1)
Sodium: 136 mmol/L (ref 135–145)
Total Bilirubin: 0.5 mg/dL (ref 0.3–1.2)
Total Protein: 6.7 g/dL (ref 6.5–8.1)

## 2020-06-10 LAB — CBC WITH DIFFERENTIAL/PLATELET
Abs Immature Granulocytes: 0.01 10*3/uL (ref 0.00–0.07)
Basophils Absolute: 0.1 10*3/uL (ref 0.0–0.1)
Basophils Relative: 1 %
Eosinophils Absolute: 0.3 10*3/uL (ref 0.0–0.5)
Eosinophils Relative: 4 %
HCT: 34.1 % — ABNORMAL LOW (ref 36.0–46.0)
Hemoglobin: 10.5 g/dL — ABNORMAL LOW (ref 12.0–15.0)
Immature Granulocytes: 0 %
Lymphocytes Relative: 25 %
Lymphs Abs: 1.7 10*3/uL (ref 0.7–4.0)
MCH: 26.4 pg (ref 26.0–34.0)
MCHC: 30.8 g/dL (ref 30.0–36.0)
MCV: 85.7 fL (ref 80.0–100.0)
Monocytes Absolute: 0.5 10*3/uL (ref 0.1–1.0)
Monocytes Relative: 8 %
Neutro Abs: 4.2 10*3/uL (ref 1.7–7.7)
Neutrophils Relative %: 62 %
Platelets: 350 10*3/uL (ref 150–400)
RBC: 3.98 MIL/uL (ref 3.87–5.11)
RDW: 13.9 % (ref 11.5–15.5)
WBC: 6.7 10*3/uL (ref 4.0–10.5)
nRBC: 0 % (ref 0.0–0.2)

## 2020-06-10 LAB — HIV ANTIBODY (ROUTINE TESTING W REFLEX): HIV Screen 4th Generation wRfx: NONREACTIVE

## 2020-06-10 LAB — GLUCOSE, CAPILLARY
Glucose-Capillary: 202 mg/dL — ABNORMAL HIGH (ref 70–99)
Glucose-Capillary: 168 mg/dL — ABNORMAL HIGH (ref 70–99)

## 2020-06-10 LAB — MAGNESIUM: Magnesium: 2.1 mg/dL (ref 1.7–2.4)

## 2020-06-10 LAB — C4 COMPLEMENT: Complement C4, Body Fluid: 41 mg/dL — ABNORMAL HIGH (ref 12–38)

## 2020-06-10 NOTE — Discharge Summary (Signed)
Name: Cynthia [name] MRN: [id number] DOB: 1956/11/05 64 y.o. PCP: Rutherford Guys, MD  Date of Admission: 06/09/2020  1:15 PM Date of Discharge: 06/10/2020 Attending Physician: No att. providers found  Discharge Diagnosis: 1. Angioedema 2. Incomplete LBBB 3. TIIDM 4. Hypertension 5. HFpEF  Discharge Medications: Allergies as of 06/10/2020      Reactions   Ambien [zolpidem Tartrate] Other (See Comments)   Sleep walks   Clonidine Derivatives Other (See Comments)   Per pt: unknown   Cymbalta [duloxetine Hcl]    Severe depression   Lyrica [pregabalin]    Severe depression   Other    NO BLOOD PRODUCTS   Percocet [oxycodone-acetaminophen] Itching   Prednisone       Medication List    STOP taking these medications   dapagliflozin propanediol 5 MG Tabs tablet Commonly known as: Farxiga   lisinopril 40 MG tablet Commonly known as: ZESTRIL     TAKE these medications   Accu-Chek Guide test strip Generic drug: glucose blood USE TO TEST BLOOD SUGAR THREE TIMES A DAY What changed:   how much to take  how to take this  when to take this   acetaminophen 500 MG tablet Commonly known as: TYLENOL Take 500 mg by mouth every 6 (six) hours as needed for mild pain, fever or headache.   amLODipine 10 MG tablet Commonly known as: NORVASC Take 1 tablet (10 mg total) by mouth daily.   atorvastatin 80 MG tablet Commonly known as: LIPITOR Take 40 mg by mouth at bedtime.   Basaglar KwikPen 100 UNIT/ML Inject 0.25 mLs (25 Units total) into the skin at bedtime. What changed:   how much to take  additional instructions   BERBERINE COMPLEX PO Take 1 capsule by mouth daily.   blood glucose meter kit and supplies Kit Dispense based on patient and insurance preference. Test blood glucose once a day. Dx E11.65 What changed:   how much to take  how to take this  when to take this   chlorthalidone 25 MG tablet Commonly known as: HYGROTON TAKE 1 TABLET (25 MG  TOTAL) BY MOUTH DAILY. PLEASE KEEP UPCOMING APPT What changed: additional instructions   Chromium Picolinate 1000 MCG Tabs Take 1,000 mcg by mouth daily.   cyclobenzaprine 5 MG tablet Commonly known as: FLEXERIL Take 1 tablet (5 mg total) by mouth 3 (three) times daily as needed for muscle spasms.   diphenhydrAMINE 25 MG tablet Commonly known as: BENADRYL Take 25 mg by mouth every 6 (six) hours as needed for allergies or sleep.   hydrALAZINE 10 MG tablet Commonly known as: APRESOLINE TAKE 1 TABLET BY MOUTH THREE TIMES A DAY What changed: when to take this   Medical Compression Stockings Misc 25-29mHG compression stocking knee high. Apply every morning. Take off every night.  Dx lymphedema What changed:   how much to take  how to take this  when to take this   metFORMIN 500 MG tablet Commonly known as: GLUCOPHAGE Take 500 mg by mouth daily with breakfast.   metoprolol tartrate 50 MG tablet Commonly known as: LOPRESSOR TAKE 1 TABLET BY MOUTH TWICE A DAY   nortriptyline 25 MG capsule Commonly known as: PAMELOR TAKE ONE CAPSULE IN THE MORNING AND TWO CAPSULES AT BEDTIME. What changed: See the new instructions.   NovoLOG FlexPen 100 UNIT/ML FlexPen Generic drug: insulin aspart INJECT 7 UNITS THREE TIMES A DAY UP TO 40 UNITS/DAY SUBCUTANEOUSLY What changed: See the new instructions.   Pen Needles  32G X 6 MM Misc 1 each by Does not apply route at bedtime.   POTASSIUM PO Take 1 tablet by mouth daily. [place]plen[place]   triamcinolone 55 MCG/ACT Aero nasal inhaler Commonly known as: NASACORT Place 2 sprays into the nose daily.   Vitamin D (Ergocalciferol) 1.25 MG (50000 UNIT) Caps capsule Commonly known as: DRISDOL Take 1 capsule (50,000 Units total) by mouth every 7 (seven) days.       Disposition and follow-up:   Ms.Charonda Yu Cragun was discharged from [facility] in Stable condition.  At the hospital follow up visit please address:  1.   Angioedema: Patient had first episode on 7/28. C4 labs were drawn and shown to be elevated, along with a history of no previous episodes and no family history Heriditary Angioedema was deemed to be unlikely. She has been on Lisinopril for 5 years and last week was started on Farxiga. Stopped Lisinopril and [place]. Should have conversation about restarting Wilder Glade since though rare it can cause angioedema. Therefore, it should be a shared decision whether to restart.   Incomplete LBBB: On EKG QRS was found to be mildly widened. Patient is asymptomatic for any type of ACS. Should get repeat EKG when following up with PCP to further discus.   TIIDM: Was recently started on Farxiga. Though rare (<1%) it can cause angioedema. Therefore, this should be a shared decision whether to restart or not.   Hypertension: Have stopped lisinopril. Could consider an ARB if further BP control is needed.   HFpEF: Have stopped lisinopril.  2.  Labs / imaging needed at time of follow-up: BMP, blood glucose, EKG  3.  Pending labs/ test needing follow-up: none  Follow-up Appointments:  Follow-up Information    Rutherford Guys, MD Follow up.   Specialty: Family Medicine Why: Keep your apppointment you already have next week. Contact information: [address] [place] [address] [place] [facility] Course by problem list: 1. Angioedema: Patient awoke on 7/28 to find lip and chin swollen on left side, it continued to spread to include right side by the time she presented to the ED. Was given IV benadryl 25 mg and IV Pepcid 20 mg. She denied swelling of the tongue, throat, difficulty breathing or feelings of obstruction. C4 labs were drawn and shown to be elevated, along with a history of no previous episodes and no family history Heriditary Angioedema was deemed to be unlikely. She has been on Lisinopril for 5 years and last week was started on Farxiga. Stopped Lisinopril and [place].  She improved over 24 hours.   2. Incomplete LBBB: On EKG QRS was found to be mildly widened. Patient is asymptomatic for any type of ACS. Recommend outpatient follow up with repeat EKG.   3. TIIDM: Recently started on farxiga and takes metformin 500 mg qam and lantus 25U qhs. With a <1% chance of causing Angioedema Wilder Glade was still stopped. Recommended discussing with PCP to make informed discission about wether to continue taking.    4. Hypertension: Taking Amlodipine 10 mg daily, Hydralazine 10 mg TID, Chlorthalidone 25 mg daily, and Lisinopril 40 mg daily. Due to angioedema have stopped Lisinopril. Restarted all other medications for discharge.   5. HFpEF: Taking Amlodipine 10 mg daily, Hydralazine 10 mg TID, Chlorthalidone 25 mg daily, and Lisinopril 40 mg daily. Due to angioedema have stopped Lisinopril. Restarted all other medications for discharge.    Discharge  Vitals:   BP (!) 135/72 (BP Location: Left Arm)   Pulse 65   Temp 98.2 F (36.8 C) (Oral)   Resp 18   Ht _0  (1.676 m)   Wt (!) 110 kg   SpO2 99%   BMI 39.16 kg/m   Pertinent Labs, Studies, and Procedures:  BMP Latest Ref Rng & Units 06/10/2020 06/09/2020 05/10/2020  Glucose 70 - 99 mg/dL 240(H) 214(H) 131(H)  BUN 8 - 23 mg/dL 33(H) 40(H) 21  Creatinine 0.44 - 1.00 mg/dL 1.65(H) 1.71(H) 1.54(H)  BUN/Creat Ratio 12 - 28 - - 14  Sodium 135 - 145 mmol/L 136 135 138  Potassium 3.5 - 5.1 mmol/L 4.1 4.1 4.5  Chloride 98 - 111 mmol/L 101 100 97  CO2 22 - 32 mmol/L _1 Calcium 8.9 - 10.3 mg/dL 9.4 9.7 9.6   CBC Latest Ref Rng & Units 06/10/2020 06/09/2020 05/10/2020  WBC 4.0 - 10.5 K/uL 6.7 7.0 7.2  Hemoglobin 12.0 - 15.0 g/dL 10.5(L) 10.9(L) 11.4  Hematocrit 36 - 46 % 34.1(L) 36.3 35.5  Platelets 150 - 400 K/uL 350 365 338   C4 complement: 41 (high)  Discharge Instructions: Discharge Instructions    Call MD for:   Complete by: As directed    Call MD for:  difficulty breathing, headache or visual  disturbances   Complete by: As directed    Call MD for:  extreme fatigue   Complete by: As directed    Call MD for:  hives   Complete by: As directed    Call MD for:  persistant dizziness or light-headedness   Complete by: As directed    Call MD for:  persistant nausea and vomiting   Complete by: As directed    Call MD for:  redness, tenderness, or signs of infection (pain, swelling, redness, odor or green/yellow discharge around incision site)   Complete by: As directed    Call MD for:  severe uncontrolled pain   Complete by: As directed    Call MD for:  temperature >100.4   Complete by: As directed    Discharge instructions   Complete by: As directed    Dear Mrs. Briere, It was a pleasure taking care of you. You were admitted for angioedema. Please make sure you stop taking your lisinopril or any medication in the ACE Inhibitor family (end in pril). Please do not take your Wilder Glade until you talk with your Primary Care Provider. Continue taking your Metformin as you have been and discus possible changes to it with your Primary Care Provider. Please keep the appointment you have with your Primary Care Provider for next week.   Increase activity slowly   Complete by: As directed       Signed: Briant Cedar, MD 06/10/2020, 4:00 PM   Pager: [phone]

## 2020-06-10 NOTE — Progress Notes (Signed)
Subjective: Interviewed patient at bedside.  Feeling better. Reports swelling has improved a lot. Wants to go home today.  Denies tongue swelling, breathing difficulty. Denies asthma history, denies wheezing. Reports that she's been coughing lately. Reports having seasonal allergies.  Reports taking 5 pills for high BP, wondering if being overmedicated. Discussed that this is unlikely and that it would be better to f/u with PCP because BP's may be different at home depending on diet, etc. Reports that she doesn't check BP at home, but has been generally pretty good at doctor visits.  Discussed likely allergic reaction to lisinopril so recommend stopping it. Discussed allergic reaction to lisinopril can happen at any time.  Discussed kidney disease, which has been worsening over the past few years. Another reason why we recommend stopping lisinopril and not taking any ACE inhibitors. Educated on maybe switching to ARB's if recommended by PCP.  Also discussed holding Andrena Mews for now just because recently started on it. Unlikely that allergy is to [place], but f/u with PCP before considering restarting it.   Discussed that may be on low dose of Metformin because of decreased kidney function. Informed to discuss this with PCP. Will keep Metformin at same dose.   Objective:  Vital signs in last 24 hours: Vitals:   06/09/20 1515 06/09/20 2214 06/09/20 2241 06/10/20 0354  BP: 124/72 (!) 130/75 125/75 (!) 122/63  Pulse: 71 85 86 69  Resp: 12  15 16   Temp:   98.6 F (37 C) 98.1 F (36.7 C)  TempSrc:   Oral Oral  SpO2: 100%  100% 96%  Weight:   (!) 110.5 kg (!) 110 kg  Height:       Physical Exam Vitals and nursing note reviewed.  Constitutional:      General: She is not in acute distress.    Appearance: Normal appearance. She is not ill-appearing or toxic-appearing.  HENT:     Head: Normocephalic and atraumatic.  Cardiovascular:     Rate and Rhythm: Normal rate and regular rhythm.      Pulses: Normal pulses.          Radial pulses are 2+ on the right side and 2+ on the left side.       Dorsalis pedis pulses are 2+ on the right side and 2+ on the left side.     Heart sounds: Normal heart sounds, S1 normal and S2 normal. No murmur heard.   Pulmonary:     Effort: Pulmonary effort is normal. No respiratory distress.     Breath sounds: Wheezing present.  Abdominal:     General: There is no distension.     Palpations: There is no mass.     Tenderness: There is no abdominal tenderness.  Musculoskeletal:        General: Swelling (lips still swollen but much reduced from yesterday) present.     Right lower leg: 1+ Edema present.     Left lower leg: 1+ Edema present.  Neurological:     General: No focal deficit present.     Mental Status: She is alert. Mental status is at baseline.      Assessment/Plan:  Active Problems:   Angioedema   Thomasenia Dowse is a 64yo female with PMH of CKD stage III, HFpEF, OSA (does not where cpap) HTN, HLD, and fibromyalgia presenting with facial swelling that has been improving, never had any tongue or throat swelling.   Angioedema: Likely bradykinin induced secondary to lisinopril as there is no  urticaria, pruritis, no family history, and a high C4 result. Also due to first occurrence low suspsion for Hereditary Angioedema. Due to <1% chance of being caused by Marcelline Deist will hold for now and will follow up with PCP about restarting. Is already greatly improved from yesterday. -Stopped Lisinopril -Hold Farxiga until follow up with PCP to discuss   Incomplete LBBB: No evidence on repeat EKG. Does have minor QRS prolongation. -Follow up with PCP outpatient   TIIDM: Currently taking 500 mg Metformin. Due to Kidney function will not increase Metformin at this point, follow up with PCP. Hold Farxiga until PCP follow up due to small chance it can cause angioedema. -Continue 500 mg Metformin until PCP follow up -Hold Farxiga until PCP  follow up -Continue Insulin doses as before discuss changes with PCP    Hypertension: Discontinue Lisinopril and avoid any other ACE class medications. Continue rest of home medications. -Follow up with PCP for further medication adjustments.   HFpEF: Will stop Lisinopril. Will resume all other home medications.   Diet: CM/HH VTE: lovenox IVF: none  Prior to Admission Living Arrangement: home Anticipated Discharge Location: home Barriers to Discharge: angioedema Dispo: Anticipated discharge today.   Lauro Franklin, MD 06/10/2020, 5:51 AM Pager: [phone] After 5pm on weekdays and 1pm on weekends: On Call pager [phone]

## 2020-06-10 NOTE — Progress Notes (Signed)
Inpatient Diabetes Program Recommendations  AACE/ADA: New Consensus Statement on Inpatient Glycemic Control (2015)  Target Ranges:  Prepandial:   less than 140 mg/dL      Peak postprandial:   less than 180 mg/dL (1-2 hours)      Critically ill patients:  140 - 180 mg/dL   Lab Results  Component Value Date   GLUCAP 202 (H) 06/10/2020   HGBA1C 9.4 (H) 05/10/2020    Review of Glycemic Control  Diabetes history: DM 2 Outpatient Diabetes medications: Basaglar 22 units qhs, Novolog 7 units tid, Farxiga 5 mg Daily, Metformin 500 mg Daily Current orders for Inpatient glycemic control:  Lantus 10 units Novolog 0-15 units tid + hs  Inpatient Diabetes Program Recommendations:    Fasting glucose 202 this am after Lantus 10 units  - Consider increasing Lantus to 15 units.  Thanks,  Christena Deem RN, MSN, BC-ADM Inpatient Diabetes Coordinator Team Pager [phone] (8a-5p)

## 2020-06-14 ENCOUNTER — Telehealth: Payer: Self-pay | Admitting: Family Medicine

## 2020-06-14 ENCOUNTER — Telehealth (INDEPENDENT_AMBULATORY_CARE_PROVIDER_SITE_OTHER): Payer: 59 | Admitting: Family Medicine

## 2020-06-14 ENCOUNTER — Encounter: Payer: Self-pay | Admitting: Family Medicine

## 2020-06-14 ENCOUNTER — Other Ambulatory Visit: Payer: Self-pay

## 2020-06-14 DIAGNOSIS — M797 Fibromyalgia: Secondary | ICD-10-CM | POA: Diagnosis not present

## 2020-06-14 DIAGNOSIS — T783XXD Angioneurotic edema, subsequent encounter: Secondary | ICD-10-CM

## 2020-06-14 DIAGNOSIS — E1149 Type 2 diabetes mellitus with other diabetic neurological complication: Secondary | ICD-10-CM | POA: Diagnosis not present

## 2020-06-14 DIAGNOSIS — Z6839 Body mass index (BMI) 39.0-39.9, adult: Secondary | ICD-10-CM

## 2020-06-14 MED ORDER — CHLORTHALIDONE 25 MG PO TABS: 25.0000 mg | ORAL_TABLET | Freq: Every day | ORAL | 1 refills | Status: DC

## 2020-06-14 MED ORDER — ATORVASTATIN CALCIUM 10 MG PO TABS: 10.0000 mg | ORAL_TABLET | Freq: Every day | ORAL | 1 refills | Status: DC

## 2020-06-14 MED ORDER — TIZANIDINE HCL 4 MG PO TABS: 4.0000 mg | ORAL_TABLET | Freq: Four times a day (QID) | ORAL | 3 refills | Status: DC | PRN
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
## 2020-06-14 NOTE — Patient Instructions (Signed)
° ° ° °  If you have lab work done today you will be contacted with your lab results within the next 2 weeks.  If you have not heard from us then please contact us. The fastest way to get your results is to register for My Chart. ° ° °IF you received an x-ray today, you will receive an invoice from [facility]. Please contact [facility] at [phone] with questions or concerns regarding your invoice.  ° °IF you received labwork today, you will receive an invoice from [facility]. Please contact [facility] at [phone] with questions or concerns regarding your invoice.  ° °Our billing staff will not be able to assist you with questions regarding bills from these companies. ° °You will be contacted with the lab results as soon as they are available. The fastest way to get your results is to activate your My Chart account. Instructions are located on the last page of this paperwork. If you have not heard from us regarding the results in 2 weeks, please contact this office. °  ° ° ° °

## 2020-06-14 NOTE — Telephone Encounter (Signed)
Pt returning Betzys call, ready for triage . She will await her nexr call

## 2020-06-24 ENCOUNTER — Encounter (INDEPENDENT_AMBULATORY_CARE_PROVIDER_SITE_OTHER): Payer: Self-pay | Admitting: Family Medicine

## 2020-06-24 ENCOUNTER — Other Ambulatory Visit: Payer: Self-pay

## 2020-06-24 ENCOUNTER — Ambulatory Visit (INDEPENDENT_AMBULATORY_CARE_PROVIDER_SITE_OTHER): Payer: 59 | Admitting: Family Medicine

## 2020-06-24 VITALS — BP 131/80 | HR 75 | Temp 98.5°F | Ht 66.0 in | Wt 242.0 lb

## 2020-06-24 DIAGNOSIS — E1165 Type 2 diabetes mellitus with hyperglycemia: Secondary | ICD-10-CM | POA: Diagnosis not present

## 2020-06-24 DIAGNOSIS — Z6839 Body mass index (BMI) 39.0-39.9, adult: Secondary | ICD-10-CM

## 2020-06-24 DIAGNOSIS — I10 Essential (primary) hypertension: Secondary | ICD-10-CM

## 2020-06-24 DIAGNOSIS — Z794 Long term (current) use of insulin: Secondary | ICD-10-CM

## 2020-06-28 NOTE — Progress Notes (Signed)
Chief Complaint:   OBESITY Cynthia [name] is here to discuss her progress with her obesity treatment plan along with follow-up of her obesity related diagnoses. Cynthia [name] is on the Category 3 Plan and states she is following her eating plan approximately 80% of the time. Cynthia [name] states she is using resistance bands and weights for 45-60 minutes 5 times per week.  Today's visit was #: 4 Starting weight: 248 lbs Starting date: 05/10/2020 Today's weight: 242 lbs Today's date: 06/24/2020 Total lbs lost to date: 6 Total lbs lost since last in-office visit: 0  Interim History: Cynthia [name] had a scare of angioedema secondary to lisinopril. She is concerned about weight gain of 2 lbs, but her water weight has increased substantially over the last few weeks. She is feeling slightly less energy. She stopped Farixga.  Subjective:   1. Type 2 diabetes mellitus with hyperglycemia, with long-term current use of insulin (HCC) Cynthia [name] is averaging 8 units prior to eating and Basaglar 22 units, although she is sometimes not taking this. She denies hypoglycemia. Her fasting BGs range between 90's and 120's, and highest blood sugars at 180 post prandial.  2. Essential hypertension Cynthia [name] blood pressure is well controlled. She denies chest pain, chest pressure, or headache. She is on amlodipine, [place]gro[place], apresolian, and lopressor.  Assessment/Plan:   1. Type 2 diabetes mellitus with hyperglycemia, with long-term current use of insulin (HCC) Good blood sugar control is important to decrease the likelihood of diabetic complications such as nephropathy, neuropathy, limb loss, blindness, coronary artery disease, and death. Intensive lifestyle modification including diet, exercise and weight loss are the first line of treatment for diabetes. Nature will continue her current medications with no change in dosages. Cynthia [name] is to restart Farixga.  2. Essential hypertension Cynthia [name] is working on healthy weight loss and exercise to  improve blood pressure control. We will watch for signs of hypotension as she continues her lifestyle modifications. Cynthia [name] will continue her current medications with no change in dosage.  3. Class 2 severe obesity with serious comorbidity and body mass index (BMI) of 39.0 to 39.9 in adult, unspecified obesity type (HCC) Cynthia [name] is currently in the action stage of change. As such, her goal is to continue with weight loss efforts. She has agreed to the Category 3 Plan.   Exercise goals: All adults should avoid inactivity. Some physical activity is better than none, and adults who participate in any amount of physical activity gain some health benefits.  Behavioral modification strategies: increasing lean protein intake, increasing vegetables, meal planning and cooking strategies, keeping healthy foods in the home and planning for success.  Cynthia [name] has agreed to follow-up with our clinic in 2 weeks. She was informed of the importance of frequent follow-up visits to maximize her success with intensive lifestyle modifications for her multiple health conditions.   Objective:   Blood pressure 131/80, pulse 75, temperature 98.5 F (36.9 C), temperature source Oral, height 5\' 6"  (1.676 m), weight 242 lb (109.8 kg), SpO2 99 %. Body mass index is 39.06 kg/m.  General: Cooperative, alert, well developed, in no acute distress. HEENT: Conjunctivae and lids unremarkable. Cardiovascular: Regular rhythm.  Lungs: Normal work of breathing. Neurologic: No focal deficits.   Lab Results  Component Value Date   CREATININE 1.65 (H) 06/10/2020   BUN 33 (H) 06/10/2020   NA 136 06/10/2020   K 4.1 06/10/2020   CL 101 06/10/2020   CO2 25 06/10/2020   Lab Results  Component Value Date   ALT 15  06/10/2020   AST 14 (L) 06/10/2020   ALKPHOS 72 06/10/2020   BILITOT 0.5 06/10/2020   Lab Results  Component Value Date   HGBA1C 9.4 (H) 05/10/2020   HGBA1C 7.6 (H) 11/28/2019   HGBA1C 7.8 (H) 06/25/2019   HGBA1C  9.7 (H) 05/31/2018   HGBA1C 7.9 (H) 02/21/2018   No results found for: INSULIN Lab Results  Component Value Date   TSH 2.290 05/10/2020   Lab Results  Component Value Date   CHOL 234 (H) 05/10/2020   HDL 50 05/10/2020   LDLCALC 151 (H) 05/10/2020   TRIG 180 (H) 05/10/2020   CHOLHDL 2.6 11/28/2019   Lab Results  Component Value Date   WBC 6.7 06/10/2020   HGB 10.5 (L) 06/10/2020   HCT 34.1 (L) 06/10/2020   MCV 85.7 06/10/2020   PLT 350 06/10/2020   Lab Results  Component Value Date   IRON 38 (L) 07/14/2011   TIBC 250 07/14/2011   FERRITIN 51 07/14/2011   Attestation Statements:   Reviewed by clinician on day of visit: allergies, medications, problem list, medical history, surgical history, family history, social history, and previous encounter notes.  Time spent on visit including pre-visit chart review and post-visit care and charting was 16 minutes.    I, Burt Knack, am acting as transcriptionist for Reuben Likes, MD.  I have reviewed the above documentation for accuracy and completeness, and I agree with the above. - Katherina Mires, MD

## 2020-07-07 ENCOUNTER — Telehealth (INDEPENDENT_AMBULATORY_CARE_PROVIDER_SITE_OTHER): Payer: Self-pay

## 2020-07-07 ENCOUNTER — Other Ambulatory Visit (INDEPENDENT_AMBULATORY_CARE_PROVIDER_SITE_OTHER): Payer: Self-pay

## 2020-07-07 ENCOUNTER — Other Ambulatory Visit: Payer: Self-pay | Admitting: Family Medicine

## 2020-07-07 DIAGNOSIS — K5909 Other constipation: Secondary | ICD-10-CM

## 2020-07-07 MED ORDER — LINACLOTIDE 72 MCG PO CAPS: 72.0000 ug | ORAL_CAPSULE | Freq: Every day | ORAL | 0 refills | Status: DC

## 2020-07-07 NOTE — Telephone Encounter (Signed)
Requested Prescriptions  Pending Prescriptions Disp Refills  . amLODipine (NORVASC) 10 MG tablet [Pharmacy Med Name: AMLODIPINE BESYLATE 10 MG TAB] 90 tablet 1    Sig: TAKE 1 TABLET BY MOUTH EVERY DAY     Cardiovascular:  Calcium Channel Blockers Passed - 07/07/2020  2:24 AM      Passed - Last BP in normal range    BP Readings from Last 1 Encounters:  06/24/20 131/80         Passed - Valid encounter within last 6 months    Recent Outpatient Visits          3 weeks ago Type II diabetes mellitus with neurological manifestations [facility])   [facility], Meda Coffee, MD   6 months ago Left groin pain   [facility], Meda Coffee, MD   7 months ago Type II diabetes mellitus with neurological manifestations [facility])   [facility], Meda Coffee, MD   8 months ago Hypertension, essential   [facility], Meda Coffee, MD   9 months ago Hypertension, renal disease   [facility], Meda Coffee, MD

## 2020-07-07 NOTE — Telephone Encounter (Signed)
Pt called stating that she had been constipated for 2 weeks since last visit. She says she can't eat anymore and she has tried stool softeners, laxatives, fiber, an enema, etc with little movement. She states no that she is not having any abdominal pain. Please advise.

## 2020-07-08 ENCOUNTER — Other Ambulatory Visit: Payer: Self-pay

## 2020-07-08 ENCOUNTER — Ambulatory Visit (INDEPENDENT_AMBULATORY_CARE_PROVIDER_SITE_OTHER): Payer: 59 | Admitting: Family Medicine

## 2020-07-08 ENCOUNTER — Encounter (INDEPENDENT_AMBULATORY_CARE_PROVIDER_SITE_OTHER): Payer: Self-pay | Admitting: Family Medicine

## 2020-07-08 VITALS — BP 155/81 | HR 94 | Temp 98.0°F | Ht 66.0 in | Wt 240.0 lb

## 2020-07-08 DIAGNOSIS — Z9189 Other specified personal risk factors, not elsewhere classified: Secondary | ICD-10-CM

## 2020-07-08 DIAGNOSIS — I10 Essential (primary) hypertension: Secondary | ICD-10-CM | POA: Diagnosis not present

## 2020-07-08 DIAGNOSIS — Z794 Long term (current) use of insulin: Secondary | ICD-10-CM

## 2020-07-08 DIAGNOSIS — K5909 Other constipation: Secondary | ICD-10-CM | POA: Diagnosis not present

## 2020-07-08 DIAGNOSIS — E1165 Type 2 diabetes mellitus with hyperglycemia: Secondary | ICD-10-CM

## 2020-07-08 DIAGNOSIS — Z6838 Body mass index (BMI) 38.0-38.9, adult: Secondary | ICD-10-CM

## 2020-07-08 DIAGNOSIS — E66812 Obesity, class 2: Secondary | ICD-10-CM

## 2020-07-08 MED ORDER — POLYETHYLENE GLYCOL 3350 17 GM/SCOOP PO POWD: 17.0000 g | Freq: Two times a day (BID) | ORAL | 1 refills | Status: DC | PRN

## 2020-07-08 MED ORDER — BASAGLAR KWIKPEN 100 UNIT/ML ~~LOC~~ SOPN: 12.0000 [IU] | PEN_INJECTOR | Freq: Every day | SUBCUTANEOUS | 0 refills | Status: DC

## 2020-07-09 ENCOUNTER — Other Ambulatory Visit: Payer: Self-pay | Admitting: Family Medicine

## 2020-07-12 NOTE — Progress Notes (Signed)
Chief Complaint:   OBESITY Cynthia [name] is here to discuss her progress with her obesity treatment plan along with follow-up of her obesity related diagnoses. Cynthia [name] is on the Category 3 Plan and states she is following her eating plan approximately 90% of the time. Cynthia [name] states she is doing resistance bands and AB workout for 60 minutes 6 times per week.  Today's visit was #: 5 Starting weight: 248 lbs Starting date: 05/10/2020 Today's weight: 240 lbs Today's date: 07/08/2020 Total lbs lost to date: 8 Total lbs lost since last in-office visit: 2  Interim History: Cynthia [name] had a flare of her fibromyalgia and has been in fairly consistent pain. She has not been able to tolerate much especially in the past few days, secondary to constipation. She is wondering if there is a meal plan with less meat. She has been self managing insulin.  Subjective:   1. Other constipation Cynthia [name] has a history of significant constipation. She is taking ducolax, laxatives, tea, and enema 2 times.  2. Type 2 diabetes mellitus with hyperglycemia, with long-term current use of insulin (HCC) Cynthia [name] has missed or skipped her insulin a few times, but her blood sugars have been between 132 and 189. She is taking no more than 8 units per meal. She is still on 12 units of Basaglar.  3. Essential hypertension Cynthia [name]'s blood pressure is slightly elevated today, but she is constipated and not feeling well.  4. At risk for heart disease Cynthia [name] is at a higher than average risk for cardiovascular disease due to obesity.   Assessment/Plan:   1. Other constipation Cynthia [name] was informed that a decrease in bowel movement frequency is normal while losing weight, but stools should not be hard or painful. Cynthia [name] is to start miralax 17 g TID for 1 week then decrease to 2 times per day until 2 normal soft bowel movements per day, then decreased to 1 time per day. Orders and follow up as documented in patient record.   Counseling Getting to  Good Bowel Health: Your goal is to have one soft bowel movement each day. Drink at least 8 glasses of water each day. Eat plenty of fiber (goal is over 25 grams each day). It is best to get most of your fiber from dietary sources which includes leafy green vegetables, fresh fruit, and whole grains. You may need to add fiber with the help of OTC fiber supplements. These include Metamucil, Citrucel, and Flaxseed. If you are still having trouble, try adding Miralax or Magnesium Citrate. If all of these changes do not work, Dietitian.  - polyethylene glycol powder (GLYCOLAX/MIRALAX) 17 GM/SCOOP powder; Take 17 g by mouth 2 (two) times daily as needed.  Dispense: 3350 g; Refill: 1  2. Type 2 diabetes mellitus with hyperglycemia, with long-term current use of insulin (HCC) Good blood sugar control is important to decrease the likelihood of diabetic complications such as nephropathy, neuropathy, limb loss, blindness, coronary artery disease, and death. Intensive lifestyle modification including diet, exercise and weight loss are the first line of treatment for diabetes. Cynthia [name] agreed to decrease her insulin to 5 units before each meal; continue Basaglar at 12 units and anticipate elevated BGs.  - Insulin Glargine (BASAGLAR KWIKPEN) 100 UNIT/ML; Inject 0.12 mLs (12 Units total) into the skin at bedtime.  Dispense: 15 mL; Refill: 0  3. Essential hypertension Cynthia [name] will continue her current medications, no change in dose. She will continue working on healthy weight loss and exercise to improve  blood pressure control. We will watch for signs of hypotension as she continues her lifestyle modifications.  4. At risk for heart disease Cynthia [name] was given approximately 15 minutes of coronary artery disease prevention counseling today. She is 64 y.o. female and has risk factors for heart disease including obesity. We discussed intensive lifestyle modifications today with an emphasis on specific weight loss  instructions and strategies.   Repetitive spaced learning was employed today to elicit superior memory formation and behavioral change.  5. Class 2 severe obesity with serious comorbidity and body mass index (BMI) of 38.0 to 38.9 in adult, unspecified obesity type (HCC) Cynthia [name] is currently in the action stage of change. As such, her goal is to continue with weight loss efforts. She has agreed to the [employer] + 300 calories.   Exercise goals: All adults should avoid inactivity. Some physical activity is better than none, and adults who participate in any amount of physical activity gain some health benefits.  Behavioral modification strategies: increasing lean protein intake, meal planning and cooking strategies and keeping healthy foods in the home.  Cynthia [name] has agreed to follow-up with our clinic in 2 weeks. She was informed of the importance of frequent follow-up visits to maximize her success with intensive lifestyle modifications for her multiple health conditions.   Objective:   Blood pressure (!) 155/81, pulse 94, temperature 98 F (36.7 C), temperature source Oral, height 5\' 6"  (1.676 m), weight 240 lb (108.9 kg), SpO2 98 %. Body mass index is 38.74 kg/m.  General: Cooperative, alert, well developed, in no acute distress. HEENT: Conjunctivae and lids unremarkable. Cardiovascular: Regular rhythm.  Lungs: Normal work of breathing. Neurologic: No focal deficits.   Lab Results  Component Value Date   CREATININE 1.65 (H) 06/10/2020   BUN 33 (H) 06/10/2020   NA 136 06/10/2020   K 4.1 06/10/2020   CL 101 06/10/2020   CO2 25 06/10/2020   Lab Results  Component Value Date   ALT 15 06/10/2020   AST 14 (L) 06/10/2020   ALKPHOS 72 06/10/2020   BILITOT 0.5 06/10/2020   Lab Results  Component Value Date   HGBA1C 9.4 (H) 05/10/2020   HGBA1C 7.6 (H) 11/28/2019   HGBA1C 7.8 (H) 06/25/2019   HGBA1C 9.7 (H) 05/31/2018   HGBA1C 7.9 (H) 02/21/2018   No results found for:  INSULIN Lab Results  Component Value Date   TSH 2.290 05/10/2020   Lab Results  Component Value Date   CHOL 234 (H) 05/10/2020   HDL 50 05/10/2020   LDLCALC 151 (H) 05/10/2020   TRIG 180 (H) 05/10/2020   CHOLHDL 2.6 11/28/2019   Lab Results  Component Value Date   WBC 6.7 06/10/2020   HGB 10.5 (L) 06/10/2020   HCT 34.1 (L) 06/10/2020   MCV 85.7 06/10/2020   PLT 350 06/10/2020   Lab Results  Component Value Date   IRON 38 (L) 07/14/2011   TIBC 250 07/14/2011   FERRITIN 51 07/14/2011   Attestation Statements:   Reviewed by clinician on day of visit: allergies, medications, problem list, medical history, surgical history, family history, social history, and previous encounter notes.   I, 07/16/2011, am acting as transcriptionist for Burt Knack, MD.  I have reviewed the above documentation for accuracy and completeness, and I agree with the above. - Reuben Likes, MD

## 2020-07-22 ENCOUNTER — Encounter (INDEPENDENT_AMBULATORY_CARE_PROVIDER_SITE_OTHER): Payer: Self-pay | Admitting: Family Medicine

## 2020-07-22 ENCOUNTER — Other Ambulatory Visit (INDEPENDENT_AMBULATORY_CARE_PROVIDER_SITE_OTHER): Payer: Self-pay

## 2020-07-22 DIAGNOSIS — Z794 Long term (current) use of insulin: Secondary | ICD-10-CM

## 2020-07-22 MED ORDER — DAPAGLIFLOZIN PROPANEDIOL 5 MG PO TABS: 5.0000 mg | ORAL_TABLET | Freq: Every day | ORAL | 0 refills | Status: DC

## 2020-07-27 ENCOUNTER — Ambulatory Visit (INDEPENDENT_AMBULATORY_CARE_PROVIDER_SITE_OTHER): Payer: 59 | Admitting: Family Medicine

## 2020-08-05 ENCOUNTER — Other Ambulatory Visit: Payer: Self-pay | Admitting: Family Medicine

## 2020-08-05 DIAGNOSIS — Z1231 Encounter for screening mammogram for malignant neoplasm of breast: Secondary | ICD-10-CM

## 2020-08-09 ENCOUNTER — Ambulatory Visit
Admission: RE | Admit: 2020-08-09 | Discharge: 2020-08-09 | Disposition: A | Discharge status: Home / Self Care | Payer: 59 | Source: Ambulatory Visit | Attending: Family Medicine | Admitting: Family Medicine

## 2020-08-09 ENCOUNTER — Other Ambulatory Visit: Payer: Self-pay

## 2020-08-09 DIAGNOSIS — Z1231 Encounter for screening mammogram for malignant neoplasm of breast: Secondary | ICD-10-CM

## 2020-08-12 ENCOUNTER — Encounter (INDEPENDENT_AMBULATORY_CARE_PROVIDER_SITE_OTHER): Payer: Self-pay

## 2020-08-12 ENCOUNTER — Ambulatory Visit (INDEPENDENT_AMBULATORY_CARE_PROVIDER_SITE_OTHER): Payer: 59 | Admitting: Family Medicine

## 2020-08-14 ENCOUNTER — Other Ambulatory Visit: Payer: Self-pay | Admitting: Family Medicine

## 2020-08-14 DIAGNOSIS — I129 Hypertensive chronic kidney disease with stage 1 through stage 4 chronic kidney disease, or unspecified chronic kidney disease: Secondary | ICD-10-CM

## 2020-08-14 NOTE — Telephone Encounter (Signed)
Requested Prescriptions  Pending Prescriptions Disp Refills  . metoprolol tartrate (LOPRESSOR) 50 MG tablet [Pharmacy Med Name: METOPROLOL TARTRATE 50 MG TAB] 180 tablet 0    Sig: TAKE 1 TABLET BY MOUTH TWICE A DAY     Cardiovascular:  Beta Blockers Failed - 08/14/2020  9:17 AM      Failed - Last BP in normal range    BP Readings from Last 1 Encounters:  07/08/20 (!) 155/81         Passed - Last Heart Rate in normal range    Pulse Readings from Last 1 Encounters:  07/08/20 94         Passed - Valid encounter within last 6 months    Recent Outpatient Visits          2 months ago Type II diabetes mellitus with neurological manifestations [facility])   [facility], Meda Coffee, MD   7 months ago Left groin pain   [facility], Meda Coffee, MD   8 months ago Type II diabetes mellitus with neurological manifestations [facility])   [facility], Meda Coffee, MD   10 months ago Hypertension, essential   [facility], Meda Coffee, MD   11 months ago Hypertension, renal disease   [facility], Meda Coffee, MD      Future Appointments            In 1 month Coralyn Helling, MD [facility]

## 2020-08-15 ENCOUNTER — Other Ambulatory Visit: Payer: Self-pay | Admitting: Family Medicine

## 2020-08-15 NOTE — Telephone Encounter (Signed)
Requested Prescriptions  Pending Prescriptions Disp Refills  . hydrALAZINE (APRESOLINE) 10 MG tablet [Pharmacy Med Name: HYDRALAZINE 10 MG TABLET] 270 tablet 1    Sig: TAKE 1 TABLET BY MOUTH THREE TIMES A DAY     Cardiovascular:  Vasodilators Failed - 08/15/2020  8:09 AM      Failed - HCT in normal range and within 360 days    HCT  Date Value Ref Range Status  06/10/2020 34.1 (L) 36 - 46 % Final   Hematocrit  Date Value Ref Range Status  05/10/2020 35.5 34.0 - 46.6 % Final         Failed - HGB in normal range and within 360 days    Hemoglobin  Date Value Ref Range Status  06/10/2020 10.5 (L) 12.0 - 15.0 g/dL Final  02/54/2706 23.7 11.1 - 15.9 g/dL Final         Failed - Last BP in normal range    BP Readings from Last 1 Encounters:  07/08/20 (!) 155/81         Passed - RBC in normal range and within 360 days    RBC  Date Value Ref Range Status  06/10/2020 3.98 3.87 - 5.11 MIL/uL Final         Passed - WBC in normal range and within 360 days    WBC  Date Value Ref Range Status  06/10/2020 6.7 4.0 - 10.5 K/uL Final         Passed - PLT in normal range and within 360 days    Platelets  Date Value Ref Range Status  06/10/2020 350 150 - 400 K/uL Final  05/10/2020 338 150 - 450 x10E3/uL Final         Passed - Valid encounter within last 12 months    Recent Outpatient Visits          2 months ago Type II diabetes mellitus with neurological manifestations [facility])   [facility], Meda Coffee, MD   7 months ago Left groin pain   [facility], Meda Coffee, MD   8 months ago Type II diabetes mellitus with neurological manifestations [facility])   [facility], Meda Coffee, MD   10 months ago Hypertension, essential   [facility], Meda Coffee, MD   11 months ago Hypertension, renal disease   [facility], Meda Coffee, MD      Future Appointments            In 1 month Coralyn Helling, MD [facility]  [facility]

## 2020-08-23 ENCOUNTER — Other Ambulatory Visit (INDEPENDENT_AMBULATORY_CARE_PROVIDER_SITE_OTHER): Payer: Self-pay

## 2020-08-23 ENCOUNTER — Encounter (INDEPENDENT_AMBULATORY_CARE_PROVIDER_SITE_OTHER): Payer: Self-pay | Admitting: Family Medicine

## 2020-08-23 DIAGNOSIS — E1165 Type 2 diabetes mellitus with hyperglycemia: Secondary | ICD-10-CM

## 2020-08-23 DIAGNOSIS — Z794 Long term (current) use of insulin: Secondary | ICD-10-CM

## 2020-08-23 MED ORDER — DAPAGLIFLOZIN PROPANEDIOL 5 MG PO TABS: 5.0000 mg | ORAL_TABLET | Freq: Every day | ORAL | 0 refills | Status: DC

## 2020-08-25 ENCOUNTER — Ambulatory Visit (INDEPENDENT_AMBULATORY_CARE_PROVIDER_SITE_OTHER): Payer: 59 | Admitting: Physician Assistant

## 2020-08-26 ENCOUNTER — Encounter (INDEPENDENT_AMBULATORY_CARE_PROVIDER_SITE_OTHER): Payer: Self-pay

## 2020-08-26 ENCOUNTER — Ambulatory Visit (INDEPENDENT_AMBULATORY_CARE_PROVIDER_SITE_OTHER): Payer: 59 | Admitting: Family Medicine

## 2020-09-16 ENCOUNTER — Other Ambulatory Visit: Payer: Self-pay

## 2020-09-16 ENCOUNTER — Encounter: Payer: Self-pay | Admitting: Family Medicine

## 2020-09-16 ENCOUNTER — Telehealth (INDEPENDENT_AMBULATORY_CARE_PROVIDER_SITE_OTHER): Payer: 59 | Admitting: Family Medicine

## 2020-09-16 DIAGNOSIS — K5909 Other constipation: Secondary | ICD-10-CM | POA: Diagnosis not present

## 2020-09-16 DIAGNOSIS — Z7185 Encounter for immunization safety counseling: Secondary | ICD-10-CM | POA: Diagnosis not present

## 2020-09-16 MED ORDER — LINACLOTIDE 72 MCG PO CAPS: 72.0000 ug | ORAL_CAPSULE | Freq: Every day | ORAL | 3 refills | Status: DC
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
## 2020-10-01 ENCOUNTER — Other Ambulatory Visit: Payer: Self-pay

## 2020-10-01 ENCOUNTER — Encounter: Payer: Self-pay | Admitting: Pulmonary Disease

## 2020-10-01 ENCOUNTER — Ambulatory Visit (INDEPENDENT_AMBULATORY_CARE_PROVIDER_SITE_OTHER): Payer: 59 | Admitting: Pulmonary Disease

## 2020-10-01 VITALS — BP 144/72 | HR 65 | Temp 97.2°F | Ht 66.0 in | Wt 234.2 lb

## 2020-10-01 DIAGNOSIS — G4733 Obstructive sleep apnea (adult) (pediatric): Secondary | ICD-10-CM | POA: Diagnosis not present

## 2020-10-01 DIAGNOSIS — J3089 Other allergic rhinitis: Secondary | ICD-10-CM | POA: Diagnosis not present

## 2020-10-01 MED ORDER — AZELASTINE HCL 0.1 % NA SOLN: 1.0000 | Freq: Two times a day (BID) | NASAL | 12 refills | Status: DC

## 2020-10-01 MED ORDER — FLUTICASONE PROPIONATE 50 MCG/ACT NA SUSP: 1.0000 | Freq: Every day | NASAL | 2 refills | Status: DC

## 2020-10-01 NOTE — Progress Notes (Signed)
[facility]  Chief Complaint  Patient presents with   Follow-up    sinus congestion    Constitutional:  BP (!) 144/72 (BP Location: Left Arm, Cuff Size: Normal)    Pulse 65    Temp (!) 97.2 F (36.2 C) (Skin)    Ht 5' 6"  (1.676 m)    Wt 234 lb 3.2 oz (106.2 kg)    SpO2 100%    BMI 37.80 kg/m   Past Medical History:  Diastolic CHF, Allergies, Anxiety, Anemia, OA, Depression, DM type 2, Fibromyalgia, HLD, HTN, Lymphedema, Pancreatitis, Neuropathy, Pneumonia, Vit D deficiency  Past Surgical History:  Her  has a past surgical history that includes Patellar tendon repair (Left); Tubal ligation (1980); NM MYOVIEW LTD (July 2012); Lower Extremity Venous Doppler (July 2012); Lower Extremity Arterial Doppler (December 2014); colonscopy; Incision and drainage hip (Right, 11/09/2014); Total hip arthroplasty (Right, 10/22/2014); and Joint replacement (N/A).  Brief Summary:  Cynthia [name] is a 64 y.o. female former smoker with sinus congestion and obstructive sleep apnea.      Subjective:   I previously saw her in 2019 for sleep apnea.  She tried CPAP for one night.  Couldn't get used to the mask or pressure and stopped using.  She still has snoring, sleep disruption and daytime fatigue.    Her main issue today relates to sinus congestion.  This happens more at night, but also occurs during the day.  She gets thick sinus drainage and then gets a cough.  She has trouble clearing the congestion.  She feels congested in her ears also.  She saw Dr. Lucia Gaskins with ENT and was told she had dust mite allergies.  She has carpeting in her bedroom.  Her symptoms are some better when she stays at her mother's house.  She has intermittently tried flonase and this has helped.  Uses benadryl at night to help with itching.  Not having dyspnea, wheeze, chest pain or chest tightness.  Physical Exam:   Appearance - well kempt   ENMT - no sinus tenderness, no oral  exudate, no LAN, Mallampati 4 airway, no stridor, boggy nasal mucosa, clear nasal drainage  Respiratory - equal breath sounds bilaterally, no wheezing or rales  CV - s1s2 regular rate and rhythm, no murmurs  Ext - no clubbing, no edema  Skin - no rashes  Psych - normal mood and affect   Sleep Tests:   HST 03/03/16 >> AHI 39.3  Social History:  She  reports that she quit smoking about 43 years ago. Her smoking use included cigarettes. She has a 3.00 pack-year smoking history. She has never used smokeless tobacco. She reports current alcohol use. She reports that she does not use drugs.  Family History:  Her family history includes Alcohol abuse in her father and mother; Arthritis in her mother; Breast cancer in her maternal aunt; Colon cancer (age of onset: 49) in her maternal uncle; Depression in her mother; Diabetes in her father and sister; Healthy in her mother; Heart disease in her father; Mental illness in her father.     Assessment/Plan:   Allergic rhinitis likely related to dust mites. - will have her try nasal irrigation on daily basis - flonase daily and azelastine bid - continue diphenhydramine qhs - will get copy of records from Dr. Rosina Lowenstein office - might need additional allergy testing - if her symptoms progress, then might need assessment for asthma  Obstructive sleep apnea. - she continues  to have snoring, sleep disruption, apnea, and daytime sleepiness - she has history of hypertension - her BMI is > 35 - I am concerned she could still have obstructive sleep apnea - will arrange for home sleep study to assess current status  Time Spent Involved in Patient Care on Day of Examination:  37 minutes  Follow up:  Patient Instructions  Use saline nasal rinse like Neti Pot daily  Flonase 1 spray in each nostril daily  Azelastine 1 spray in each nostril twice daily  Okay to take benadryl at night  Try getting dust mite covers for your pillow and  mattress  Wash your bed lines at least once per week in hot water  Will have you sign a release form to get medical records from Dr. Rosina Lowenstein office with ENT  Will arrange for home sleep study  Follow up in 8 weeks    Medication List:   Allergies as of 10/01/2020      Reactions   Ambien [zolpidem Tartrate] Other (See Comments)   Sleep walks   Clonidine Derivatives Other (See Comments)   Per pt: unknown   Cymbalta [duloxetine Hcl]    Severe depression   Lisinopril Swelling   Severe lip swelling, admitted to the hospital 06/09/20 - 06/10/20   Lyrica [pregabalin]    Severe depression   Other    NO BLOOD PRODUCTS   Percocet [oxycodone-acetaminophen] Itching   Prednisone       Medication List       Accurate as of October 01, 2020 12:45 PM. If you have any questions, ask your nurse or doctor.        STOP taking these medications   BERBERINE COMPLEX PO Stopped by: Chesley Mires, MD   metFORMIN 500 MG tablet Commonly known as: GLUCOPHAGE Stopped by: Chesley Mires, MD   polyethylene glycol powder 17 GM/SCOOP powder Commonly known as: GLYCOLAX/MIRALAX Stopped by: Chesley Mires, MD     TAKE these medications   Accu-Chek Guide test strip Generic drug: glucose blood USE TO TEST BLOOD SUGAR THREE TIMES A DAY What changed:   how much to take  how to take this  when to take this   acetaminophen 500 MG tablet Commonly known as: TYLENOL Take 500 mg by mouth every 6 (six) hours as needed for mild pain, fever or headache.   amLODipine 10 MG tablet Commonly known as: NORVASC TAKE 1 TABLET BY MOUTH EVERY DAY   atorvastatin 10 MG tablet Commonly known as: LIPITOR Take 1 tablet (10 mg total) by mouth at bedtime.   azelastine 0.1 % nasal spray Commonly known as: ASTELIN Place 1 spray into both nostrils 2 (two) times daily. Use in each nostril as directed Started by: Chesley Mires, MD   Basaglar KwikPen 100 UNIT/ML Inject 0.12 mLs (12 Units total) into the  skin at bedtime.   blood glucose meter kit and supplies Kit Dispense based on patient and insurance preference. Test blood glucose once a day. Dx E11.65 What changed:   how much to take  how to take this  when to take this   chlorthalidone 25 MG tablet Commonly known as: HYGROTON Take 1 tablet (25 mg total) by mouth daily.   Chromium Picolinate 1000 MCG Tabs Take 1,000 mcg by mouth daily.   dapagliflozin propanediol 5 MG Tabs tablet Commonly known as: Farxiga Take 1 tablet (5 mg total) by mouth daily before breakfast.   diphenhydrAMINE 25 MG tablet Commonly known as: BENADRYL Take 25 mg by  mouth every 6 (six) hours as needed for allergies or sleep.   fluticasone 50 MCG/ACT nasal spray Commonly known as: FLONASE Place 1 spray into both nostrils daily. Started by: Chesley Mires, MD   hydrALAZINE 10 MG tablet Commonly known as: APRESOLINE TAKE 1 TABLET BY MOUTH THREE TIMES A DAY   linaclotide 72 MCG capsule Commonly known as: Linzess Take 1 capsule (72 mcg total) by mouth daily before breakfast.   Medical Compression Stockings Misc 25-30mHG compression stocking knee high. Apply every morning. Take off every night.  Dx lymphedema What changed:   how much to take  how to take this  when to take this   metoprolol tartrate 50 MG tablet Commonly known as: LOPRESSOR TAKE 1 TABLET BY MOUTH TWICE A DAY   nortriptyline 25 MG capsule Commonly known as: PAMELOR TAKE ONE CAPSULE IN THE MORNING AND TWO CAPSULES AT BEDTIME. What changed: See the new instructions.   NovoLOG FlexPen 100 UNIT/ML FlexPen Generic drug: insulin aspart INJECT 7 UNITS THREE TIMES A DAY UP TO 40 UNITS/DAY SUBCUTANEOUSLY What changed: See the new instructions.   Pen Needles 32G X 6 MM Misc 1 each by Does not apply route at bedtime.   POTASSIUM PO Take 1 tablet by mouth daily. Kplenish   tiZANidine 4 MG tablet Commonly known as: ZANAFLEX Take 1 tablet (4 mg total) by mouth every 6 (six)  hours as needed for muscle spasms.   Vitamin D (Ergocalciferol) 1.25 MG (50000 UNIT) Caps capsule Commonly known as: DRISDOL Take 1 capsule (50,000 Units total) by mouth every 7 (seven) days.       Signature:  VChesley Mires MD LSusitna NorthPager - ([phone]/19/2021, 12:45 PM

## 2020-10-01 NOTE — Patient Instructions (Signed)
Use saline nasal rinse like Neti Pot daily  Flonase 1 spray in each nostril daily  Azelastine 1 spray in each nostril twice daily  Okay to take benadryl at night  Try getting dust mite covers for your pillow and mattress  Wash your bed lines at least once per week in hot water  Will have you sign a release form to get medical records from Dr. Lorn Junes office with ENT  Will arrange for home sleep study  Follow up in 8 weeks

## 2020-10-11 ENCOUNTER — Other Ambulatory Visit: Payer: Self-pay | Admitting: Family Medicine

## 2020-10-11 DIAGNOSIS — Z794 Long term (current) use of insulin: Secondary | ICD-10-CM

## 2020-10-11 MED ORDER — DAPAGLIFLOZIN PROPANEDIOL 5 MG PO TABS: 5.0000 mg | ORAL_TABLET | Freq: Every day | ORAL | 0 refills | Status: DC

## 2020-10-11 MED ORDER — TIZANIDINE HCL 4 MG PO TABS: 4.0000 mg | ORAL_TABLET | Freq: Four times a day (QID) | ORAL | 3 refills | Status: DC | PRN

## 2020-10-11 NOTE — Telephone Encounter (Signed)
tiZANidine (ZANAFLEX) 4 MG tablet Medication Date: 06/14/2020 Department: [facility] Ordering/Authorizing: Myles Lipps, MD   dapagliflozin propanediol (FARXIGA) 5 MG TABS tablet Medication Date: 08/23/2020 Department: [facility] Ordering/Authorizing: Langston Reusing, MD   [phone], [place] - [address] Phone:  [phone]  Fax:  [phone]     Pt states that had spoken to [place], had told pt she would take over the [facility] due to she was no longer in weight loss program, states this is a diabetic drug and has an appt to meet her new dr. Canary Brim if issue

## 2020-10-11 NOTE — Telephone Encounter (Signed)
Requested medication (s) are due for refill today: yes  Requested medication (s) are on the active medication list: yes   Future visit scheduled: this refill cannot be delegated   Notes to clinic: Pt states that had spoken to [place], had told pt she would take over the [facility] due to she was no longer in weight loss program, states this is a diabetic drug and has an appt to meet her new dr. Daryll Brod if issue   Requested Prescriptions  Pending Prescriptions Disp Refills   dapagliflozin propanediol (FARXIGA) 5 MG TABS tablet 30 tablet 0    Sig: Take 1 tablet (5 mg total) by mouth daily before breakfast.      Endocrinology:  Diabetes - SGLT2 Inhibitors Failed - 10/11/2020 12:17 PM      Failed - Cr in normal range and within 360 days    Creatinine, Ser  Date Value Ref Range Status  06/10/2020 1.65 (H) 0.44 - 1.00 mg/dL Final   Creatinine,U  Date Value Ref Range Status  03/14/2017 136.8 mg/dL Final          Failed - LDL in normal range and within 360 days    LDL Chol Calc (NIH)  Date Value Ref Range Status  05/10/2020 151 (H) 0 - 99 mg/dL Final          Failed - HBA1C is between 0 and 7.9 and within 180 days    Hemoglobin A1C  Date Value Ref Range Status  02/11/2016 7.7  Final   Hgb A1c MFr Bld  Date Value Ref Range Status  05/10/2020 9.4 (H) 4.8 - 5.6 % Final    Comment:             Prediabetes: 5.7 - 6.4          Diabetes: >6.4          Glycemic control for adults with diabetes: <7.0           Failed - AA eGFR in normal range and within 360 days    GFR calc Af Amer  Date Value Ref Range Status  06/10/2020 38 (L) >60 mL/min Final   GFR calc non Af Amer  Date Value Ref Range Status  06/10/2020 32 (L) >60 mL/min Final   GFR  Date Value Ref Range Status  03/14/2017 51.15 (L) >60.00 mL/min Final          Passed - Valid encounter within last 6 months    Recent Outpatient Visits           3 weeks ago Vaccine counseling   [facility] [place] Just, Laurita Quint,  FNP   3 months ago Type II diabetes mellitus with neurological manifestations [facility])   [facility], Lilia Argue, MD   9 months ago Left groin pain   [facility], Lilia Argue, MD   10 months ago Type II diabetes mellitus with neurological manifestations [facility])   [facility], Lilia Argue, MD   12 months ago Hypertension, essential   [facility], Lilia Argue, MD       Future Appointments             In 1 month Just, Laurita Quint, FNP [facility] [place], [facility]   In 1 month Chesley Mires, MD [facility]              tiZANidine (ZANAFLEX) 4 MG tablet 90 tablet 3  Sig: Take 1 tablet (4 mg total) by mouth every 6 (six) hours as needed for muscle spasms.      Not Delegated - Cardiovascular:  Alpha-2 Agonists - tizanidine Failed - 10/11/2020 12:17 PM      Failed - This refill cannot be delegated      Passed - Valid encounter within last 6 months    Recent Outpatient Visits           3 weeks ago Vaccine counseling   [facility] [place] Just, Laurita Quint, FNP   3 months ago Type II diabetes mellitus with neurological manifestations [facility])   [facility], Lilia Argue, MD   9 months ago Left groin pain   [facility], Lilia Argue, MD   10 months ago Type II diabetes mellitus with neurological manifestations [facility])   [facility], Lilia Argue, MD   12 months ago Hypertension, essential   [facility], Lilia Argue, MD       Future Appointments             In 1 month Just, Laurita Quint, FNP [facility] [place], [facility]   In 1 month Chesley Mires, MD [facility]

## 2020-11-17 ENCOUNTER — Other Ambulatory Visit: Payer: Self-pay

## 2020-11-17 ENCOUNTER — Ambulatory Visit: Payer: 59

## 2020-11-17 DIAGNOSIS — G4733 Obstructive sleep apnea (adult) (pediatric): Secondary | ICD-10-CM

## 2020-11-19 ENCOUNTER — Telehealth: Payer: Self-pay | Admitting: Pulmonary Disease

## 2020-11-19 DIAGNOSIS — G4733 Obstructive sleep apnea (adult) (pediatric): Secondary | ICD-10-CM

## 2020-11-19 NOTE — Telephone Encounter (Signed)
HST 11/18/20 >> AHI 12.7, SpO2 low SpO2 low 72%   Please inform her that her sleep study shows mild obstructive sleep apnea.  Will discuss in more detail at [facility] on 11/26/20

## 2020-11-19 NOTE — Telephone Encounter (Signed)
Called and left message on voicemail to please return phone call to go over results. Contact number provided. 

## 2020-11-23 ENCOUNTER — Ambulatory Visit (INDEPENDENT_AMBULATORY_CARE_PROVIDER_SITE_OTHER): Payer: 59 | Admitting: Family Medicine

## 2020-11-23 ENCOUNTER — Other Ambulatory Visit: Payer: Self-pay

## 2020-11-23 ENCOUNTER — Encounter: Payer: Self-pay | Admitting: Family Medicine

## 2020-11-23 ENCOUNTER — Encounter: Payer: Self-pay | Admitting: *Deleted

## 2020-11-23 VITALS — BP 144/78 | HR 67 | Temp 97.9°F | Ht 66.0 in | Wt 238.0 lb

## 2020-11-23 DIAGNOSIS — I10 Essential (primary) hypertension: Secondary | ICD-10-CM

## 2020-11-23 DIAGNOSIS — Z6838 Body mass index (BMI) 38.0-38.9, adult: Secondary | ICD-10-CM

## 2020-11-23 DIAGNOSIS — G5601 Carpal tunnel syndrome, right upper limb: Secondary | ICD-10-CM

## 2020-11-23 DIAGNOSIS — G63 Polyneuropathy in diseases classified elsewhere: Secondary | ICD-10-CM

## 2020-11-23 DIAGNOSIS — Z1211 Encounter for screening for malignant neoplasm of colon: Secondary | ICD-10-CM

## 2020-11-23 DIAGNOSIS — F320 Major depressive disorder, single episode, mild: Secondary | ICD-10-CM

## 2020-11-23 DIAGNOSIS — E1165 Type 2 diabetes mellitus with hyperglycemia: Secondary | ICD-10-CM

## 2020-11-23 DIAGNOSIS — Z1159 Encounter for screening for other viral diseases: Secondary | ICD-10-CM

## 2020-11-23 DIAGNOSIS — I739 Peripheral vascular disease, unspecified: Secondary | ICD-10-CM

## 2020-11-23 DIAGNOSIS — E1149 Type 2 diabetes mellitus with other diabetic neurological complication: Secondary | ICD-10-CM

## 2020-11-23 DIAGNOSIS — Z794 Long term (current) use of insulin: Secondary | ICD-10-CM

## 2020-11-23 DIAGNOSIS — E2839 Other primary ovarian failure: Secondary | ICD-10-CM

## 2020-11-23 DIAGNOSIS — I5032 Chronic diastolic (congestive) heart failure: Secondary | ICD-10-CM | POA: Diagnosis not present

## 2020-11-23 DIAGNOSIS — Z23 Encounter for immunization: Secondary | ICD-10-CM

## 2020-11-23 MED ORDER — NOVOLOG FLEXPEN 100 UNIT/ML ~~LOC~~ SOPN: 7.0000 [IU] | PEN_INJECTOR | SUBCUTANEOUS | 3 refills | Status: DC

## 2020-11-23 MED ORDER — DAPAGLIFLOZIN PROPANEDIOL 5 MG PO TABS: 5.0000 mg | ORAL_TABLET | Freq: Every day | ORAL | 3 refills | Status: DC
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
## 2020-11-23 NOTE — Patient Instructions (Addendum)
Carpal Tunnel Syndrome  Carpal tunnel syndrome is a condition that causes pain, numbness, and weakness in your hand and fingers. The carpal tunnel is a narrow area located on the palm side of your wrist. Repeated wrist motion or certain diseases may cause swelling within the tunnel. This swelling pinches the main nerve in the wrist. The main nerve in the wrist is called the median nerve. What are the causes? This condition may be caused by:  Repeated and forceful wrist and hand motions.  Wrist injuries.  Arthritis.  A cyst or tumor in the carpal tunnel.  Fluid buildup during pregnancy.  Use of tools that vibrate. Sometimes the cause of this condition is not known. What increases the risk? The following factors may make you more likely to develop this condition:  Having a job that requires you to repeatedly or forcefully move your wrist or hand or requires you to use tools that vibrate. This may include jobs that involve using computers, working on an [employer], or working with power tools such as Radiographer, therapeutic.  Being a woman.  Having certain conditions, such as: ? Diabetes. ? Obesity. ? An underactive thyroid (hypothyroidism). ? Kidney failure. ? Rheumatoid arthritis. What are the signs or symptoms? Symptoms of this condition include:  A tingling feeling in your fingers, especially in your thumb, index, and middle fingers.  Tingling or numbness in your hand.  An aching feeling in your entire arm, especially when your wrist and elbow are bent for a long time.  Wrist pain that goes up your arm to your shoulder.  Pain that goes down into your palm or fingers.  A weak feeling in your hands. You may have trouble grabbing and holding items. Your symptoms may feel worse during the night. How is this diagnosed? This condition is diagnosed with a medical history and physical exam. You may also have tests, including:  Electromyogram (EMG). This test measures  electrical signals sent by your nerves into the muscles.  Nerve conduction study. This test measures how well electrical signals pass through your nerves.  Imaging tests, such as X-rays, ultrasound, and MRI. These tests check for possible causes of your condition. How is this treated? This condition may be treated with:  Lifestyle changes. It is important to stop or change the activity that caused your condition.  Doing exercise and activities to strengthen and stretch your muscles and tendons (physical therapy).  Making lifestyle changes to help with your condition and learning how to do your daily activities safely (occupational therapy).  Medicines for pain and inflammation. This may include medicine that is injected into your wrist.  A wrist splint or brace.  Surgery. Follow these instructions at home: If you have a splint or brace:  Wear the splint or brace as told by your health care provider. Remove it only as told by your health care provider.  Loosen the splint or brace if your fingers tingle, become numb, or turn cold and blue.  Keep the splint or brace clean.  If the splint or brace is not waterproof: ? Do not let it get wet. ? Cover it with a watertight covering when you take a bath or shower. Managing pain, stiffness, and swelling If directed, put ice on the painful area. To do this:  If you have a removeable splint or brace, remove it as told by your health care provider.  Put ice in a plastic bag.  Place a towel between your skin  and the bag or between the splint or brace and the bag.  Leave the ice on for 20 minutes, 2-3 times a day. Do not fall asleep with the cold pack on your skin.  Remove the ice if your skin turns bright red. This is very important. If you cannot feel pain, heat, or cold, you have a greater risk of damage to the area. Move your fingers often to reduce stiffness and swelling.   General instructions  Take over-the-counter and  prescription medicines only as told by your health care provider.  Rest your wrist and hand from any activity that may be causing your pain. If your condition is work related, talk with your employer about changes that can be made, such as getting a wrist pad to use while typing.  Do any exercises as told by your health care provider, physical therapist, or occupational therapist.  Keep all follow-up visits. This is important. Contact a health care provider if:  You have new symptoms.  Your pain is not controlled with medicines.  Your symptoms get worse. Get help right away if:  You have severe numbness or tingling in your wrist or hand. Summary  Carpal tunnel syndrome is a condition that causes pain, numbness, and weakness in your hand and fingers.  It is usually caused by repeated wrist motions.  Lifestyle changes and medicines are used to treat carpal tunnel syndrome. Surgery may be recommended.  Follow your health care provider's instructions about wearing a splint, resting from activity, keeping follow-up visits, and calling for help. This information is not intended to replace advice given to you by your health care provider. Make sure you discuss any questions you have with your health care provider. Document Revised: 03/11/2020 Document Reviewed: 03/11/2020 Elsevier Patient Education  2021 [employer] Inc.  Preventing Carpal Tunnel Syndrome  Carpal tunnel syndrome is a condition that causes pain, numbness, and weakness in the wrist, hand, and fingers. The carpal tunnel is a narrow, rigid space in the wrist. Tendons and the median nerve pass through the carpal tunnel. The median nerve is the main nerve in the hand. The median nerve gives sensation or feeling to the thumb, the muscles at the base of the thumb, and the first three fingers. Carpal tunnel syndrome happens when the median nerve gets squeezed in the area where it passes through the carpal tunnel. In some cases, it may  not be possible to prevent carpal tunnel syndrome. However, you can take steps to relieve pressure on your wrist and reduce your risk of developing this condition. How can carpal tunnel syndrome affect me? Carpal tunnel syndrome can affect your ability to do jobs or activities that involve hand, wrist, and finger action. It can cause symptoms such as:  Pain in the wrist, hand, and fingers.  Burning, tingling, or numbness in the affected area.  A weak feeling in your hands. You may have trouble grabbing and holding items. Symptoms may get worse over time. For some people, symptoms get worse at night. What can increase my risk? The following factors may make you more likely to develop this condition:  Having a job that requires you to repeatedly or forcefully bend or move your wrist, or a job that requires you to use tools that vibrate. This may include jobs that involve using computers, working on an [employer], or working with power tools such as Radiographer, therapeutic.  Being a woman.  Having a family history of the condition.  Having certain conditions,  such as: ? Diabetes. ? Pregnancy. ? Obesity. ? Thyroid disease. ? Rheumatoid arthritis. What actions can I take to help prevent carpal tunnel syndrome?  Avoid making repetitive or forceful hand and wrist motions that cause your wrist to bend or get stiff or painful.  Take frequent breaks, about every 30 minutes, if you use your hands and wrists for many hours at a time.  Avoid sitting for long stretches of time. Try to get up and move every 30 minutes. Stretch your hands and fingers often to increase blood flow and relieve tension.  Keep your wrists in the natural position when using a computer keyboard or mouse. Do not bend your wrists downward or sideways. Arms and shoulders should be relaxed with elbows at your sides.  If you use your hands and wrists for many hours at work, make changes to your work space to ease pressure on  your wrists. You may want to use: ? A padded wrist rest for computer work. Use this to lightly rest your wrist and hands when you are not actively keying. ? A keyboard at a height in which your wrists are straight when typing. You may need to flatten the keyboard or even tilt it away from you. ? Hand tools with padded handles or work gloves with padding to reduce vibrations.  Talk to your health provider about wearing a wrist brace or support. This will not prevent carpal tunnel syndrome but it may keep it from getting worse. A wrist brace may help reduce bending and stress.  Closely manage any medical conditions you have that can put you at risk for carpal tunnel syndrome. Have your blood sugar checked to make sure you are not developing diabetes. If you have diabetes, work with your health care provider to keep your blood sugar under control.  Physical activity and exercise may help with this condition. Some people find yoga or aerobic exercise helpful.      Where to find more information  [employer] of Neurological Disorders and Stroke: BasicFM.no  American Academy of Family Physicians: Hydrologist.org Contact a health care provider if:  You have numbness or tingling in your wrist, hand, or fingers.  You have pain or a burning sensation in your wrist, hand, or fingers.  Pain, tingling, or burning wakes you up at night.  Your hand becomes weak and clumsy.  You frequently drop objects.  You are unable to use your wrists and hands without pain. Summary  Carpal tunnel syndrome is a condition that causes pain, numbness, and weakness in the wrist, hand, and fingers.  You can take steps to relieve pressure on your wrist and reduce your risk of developing this condition.  Avoid making repetitive hand and wrist motions that cause your wrist to get stiff or painful.  If you use your hands and wrists for many hours at work, you may want to make changes to your work space to  ease pressure on your wrists.  Take frequent breaks to stretch your hands and fingers. This information is not intended to replace advice given to you by your health care provider. Make sure you discuss any questions you have with your health care provider. Document Revised: 04/15/2020 Document Reviewed: 03/11/2020 Elsevier Patient Education  2021 [employer].  If you have lab work done today you will be contacted with your lab results within the next 2 weeks.  If you have not heard from [place] then please contact us. The fastest way to get your results  is to register for My Chart.   IF you received an x-ray today, you will receive an invoice from [facility]. Please contact [facility] at [phone] with questions or concerns regarding your invoice.   IF you received labwork today, you will receive an invoice from [place]. Please contact [facility] at [phone] with questions or concerns regarding your invoice.   Our billing staff will not be able to assist you with questions regarding bills from these companies.  You will be contacted with the lab results as soon as they are available. The fastest way to get your results is to activate your My Chart account. Instructions are located on the last page of this paperwork. If you have not heard from [place] regarding the results in 2 weeks, please contact this office.

## 2020-11-24 ENCOUNTER — Other Ambulatory Visit: Payer: Self-pay | Admitting: Family Medicine

## 2020-11-24 DIAGNOSIS — Z794 Long term (current) use of insulin: Secondary | ICD-10-CM

## 2020-11-24 DIAGNOSIS — I129 Hypertensive chronic kidney disease with stage 1 through stage 4 chronic kidney disease, or unspecified chronic kidney disease: Secondary | ICD-10-CM

## 2020-11-24 DIAGNOSIS — E1165 Type 2 diabetes mellitus with hyperglycemia: Secondary | ICD-10-CM

## 2020-11-24 DIAGNOSIS — E785 Hyperlipidemia, unspecified: Secondary | ICD-10-CM

## 2020-11-24 LAB — CMP14+EGFR
ALT: 10 IU/L (ref 0–32)
AST: 12 IU/L (ref 0–40)
Albumin/Globulin Ratio: 1.4 (ref 1.2–2.2)
Albumin: 4.1 g/dL (ref 3.8–4.8)
Alkaline Phosphatase: 80 IU/L (ref 44–121)
BUN/Creatinine Ratio: 16 (ref 12–28)
BUN: 22 mg/dL (ref 8–27)
Bilirubin Total: 0.3 mg/dL (ref 0.0–1.2)
CO2: 27 mmol/L (ref 20–29)
Calcium: 9.7 mg/dL (ref 8.7–10.3)
Chloride: 97 mmol/L (ref 96–106)
Creatinine, Ser: 1.36 mg/dL — ABNORMAL HIGH (ref 0.57–1.00)
GFR calc Af Amer: 47 mL/min/{1.73_m2} — ABNORMAL LOW (ref >59)
GFR calc non Af Amer: 41 mL/min/{1.73_m2} — ABNORMAL LOW (ref >59)
Globulin, Total: 2.9 g/dL (ref 1.5–4.5)
Glucose: 81 mg/dL (ref 65–99)
Potassium: 3.7 mmol/L (ref 3.5–5.2)
Sodium: 137 mmol/L (ref 134–144)
Total Protein: 7 g/dL (ref 6.0–8.5)

## 2020-11-24 LAB — HEMOGLOBIN A1C
Est. average glucose Bld gHb Est-mCnc: 203 mg/dL
Hgb A1c MFr Bld: 8.7 % — ABNORMAL HIGH (ref 4.8–5.6)

## 2020-11-24 LAB — LIPID PANEL
Chol/HDL Ratio: 3.1 ratio (ref 0.0–4.4)
Cholesterol, Total: 205 mg/dL — ABNORMAL HIGH (ref 100–199)
HDL: 67 mg/dL (ref >39)
LDL Chol Calc (NIH): 122 mg/dL — ABNORMAL HIGH (ref 0–99)
Triglycerides: 88 mg/dL (ref 0–149)
VLDL Cholesterol Cal: 16 mg/dL (ref 5–40)

## 2020-11-24 LAB — HEPATITIS C ANTIBODY: Hep C Virus Ab: <0.1 s/co ratio (ref 0.0–0.9)

## 2020-11-24 MED ORDER — ATORVASTATIN CALCIUM 20 MG PO TABS: 20.0000 mg | ORAL_TABLET | Freq: Every day | ORAL | 1 refills | Status: DC

## 2020-11-24 MED ORDER — DAPAGLIFLOZIN PROPANEDIOL 10 MG PO TABS: 10.0000 mg | ORAL_TABLET | Freq: Every day | ORAL | 1 refills | Status: DC

## 2020-11-24 NOTE — Telephone Encounter (Signed)
Tried calling the pt and there was no answer and no option to leave msg.

## 2020-11-26 ENCOUNTER — Encounter: Payer: Self-pay | Admitting: Pulmonary Disease

## 2020-11-26 ENCOUNTER — Ambulatory Visit (INDEPENDENT_AMBULATORY_CARE_PROVIDER_SITE_OTHER): Payer: 59 | Admitting: Pulmonary Disease

## 2020-11-26 ENCOUNTER — Other Ambulatory Visit: Payer: Self-pay

## 2020-11-26 ENCOUNTER — Encounter: Payer: Self-pay | Admitting: Family Medicine

## 2020-11-26 VITALS — BP 150/80 | HR 75 | Temp 98.4°F | Ht 66.0 in | Wt 238.0 lb

## 2020-11-26 DIAGNOSIS — J3089 Other allergic rhinitis: Secondary | ICD-10-CM | POA: Diagnosis not present

## 2020-11-26 DIAGNOSIS — G4733 Obstructive sleep apnea (adult) (pediatric): Secondary | ICD-10-CM | POA: Diagnosis not present

## 2020-11-26 NOTE — Progress Notes (Signed)
[facility]  Chief Complaint  Patient presents with  . Follow-up    Get results of home sleep test.    Constitutional:  BP (!) 150/80 (BP Location: Left Arm, Patient Position: Sitting, Cuff Size: Large)   Pulse 75   Temp 98.4 F (36.9 C) (Temporal)   Ht _0  (1.676 m)   Wt 238 lb (108 kg)   SpO2 97%   BMI 38.41 kg/m   Past Medical History:  Diastolic CHF, Allergies, Anxiety, Anemia, OA, Depression, DM type 2, Fibromyalgia, HLD, HTN, Lymphedema, Pancreatitis, Neuropathy, Pneumonia, Vit D deficiency  Past Surgical History:  Her  has a past surgical history that includes Patellar tendon repair (Left); Tubal ligation (1980); NM MYOVIEW LTD (July 2012); Lower Extremity Venous Doppler (July 2012); Lower Extremity Arterial Doppler (December 2014); colonscopy; Incision and drainage hip (Right, 11/09/2014); Total hip arthroplasty (Right, 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  Brief Summary:  Cynthia [name] is a 65 y.o. female former smoker with sinus congestion and obstructive sleep apnea.      Subjective:   She had home sleep study.  Showed mild sleep apnea.  Improve compared to 2017.  She is enrolled in weight watchers. Her husband works 3rd shift.  As a result she goes to bed at 2 am and wakes up at 9 am.  She uses benadryl at night to help with her sinus and this helps her sleep.  Also use a muscle relaxant at night to help sleep.  Gets sinus congestion and post nasal drip, especially in the morning.  Not having chest congestion or wheezing.  Physical Exam:   Appearance - well kempt   ENMT - no sinus tenderness, no oral exudate, no LAN, Mallampati 4 airway, no stridor  Respiratory - equal breath sounds bilaterally, no wheezing or rales  CV - s1s2 regular rate and rhythm, no murmurs  Ext - no clubbing, no edema  Skin - no rashes  Psych - normal mood and affect   Sleep Tests:   HST 03/03/16 >> AHI  39.3  HST 11/18/20 >> AHI 12.7, SpO2 low SpO2 low 72%  Social History:  She  reports that she quit smoking about 44 years ago. Her smoking use included cigarettes. She has a 3.00 pack-year smoking history. She has never used smokeless tobacco. She reports current alcohol use. She reports that she does not use drugs.  Family History:  Her family history includes Alcohol abuse in her father and mother; Arthritis in her mother; Breast cancer in her maternal aunt; Colon cancer (age of onset: 61) in her maternal uncle; Depression in her mother; Diabetes in her father and sister; Healthy in her mother; Heart disease in her father; Mental illness in her father.     Assessment/Plan:   Allergic rhinitis likely related to dust mites. - continue nasal irrigation - continue flonase and azelastine - diphenhydramine qhs - she has been seen by Dr. Melony Overly with ENT  Obstructive sleep apnea. - reviewed her sleep test results - discussed treatment options - she feels her sleep is improving with weight loss, and would like to continue these efforts for now - will reassess status in 6 months  Time Spent Involved in Patient Care on Day of Examination:  21 minutes  Follow up:  Patient Instructions  Follow up in 6 months   Medication List:   Allergies as of 11/26/2020      Reactions   Ambien [zolpidem Tartrate]  Other (See Comments)   Sleep walks   Clonidine Derivatives Other (See Comments)   Per pt: unknown   Cymbalta [duloxetine Hcl]    Severe depression   Lisinopril Swelling   Severe lip swelling, admitted to the hospital 06/09/20 - 06/10/20   Lyrica [pregabalin]    Severe depression   Other    NO BLOOD PRODUCTS   Percocet [oxycodone-acetaminophen] Itching   Prednisone    Causes blood sugars to elevate       Medication List       Accurate as of November 26, 2020 11:39 AM. If you have any questions, ask your nurse or doctor.        STOP taking these medications    azelastine 0.1 % nasal spray Commonly known as: ASTELIN Stopped by: Chesley Mires, MD     TAKE these medications   Accu-Chek Guide test strip Generic drug: glucose blood USE TO TEST BLOOD SUGAR THREE TIMES A DAY What changed:   how much to take  how to take this  when to take this   acetaminophen 500 MG tablet Commonly known as: TYLENOL Take 500 mg by mouth every 6 (six) hours as needed for mild pain, fever or headache.   amLODipine 10 MG tablet Commonly known as: NORVASC TAKE 1 TABLET BY MOUTH EVERY DAY   atorvastatin 20 MG tablet Commonly known as: LIPITOR Take 1 tablet (20 mg total) by mouth at bedtime.   Basaglar KwikPen 100 UNIT/ML Inject 12 Units into the skin at bedtime.   blood glucose meter kit and supplies Kit Dispense based on patient and insurance preference. Test blood glucose once a day. Dx E11.65 What changed:   how much to take  how to take this  when to take this   chlorthalidone 25 MG tablet Commonly known as: HYGROTON Take 1 tablet (25 mg total) by mouth daily.   Chromium Picolinate 1000 MCG Tabs Take 1,000 mcg by mouth daily.   dapagliflozin propanediol 10 MG Tabs tablet Commonly known as: Farxiga Take 1 tablet (10 mg total) by mouth daily before breakfast.   diphenhydrAMINE 25 MG tablet Commonly known as: BENADRYL Take 25 mg by mouth every 6 (six) hours as needed for allergies or sleep.   fluticasone 50 MCG/ACT nasal spray Commonly known as: FLONASE Place 1 spray into both nostrils daily.   hydrALAZINE 10 MG tablet Commonly known as: APRESOLINE TAKE 1 TABLET BY MOUTH THREE TIMES A DAY   linaclotide 72 MCG capsule Commonly known as: Linzess Take 1 capsule (72 mcg total) by mouth daily before breakfast.   Medical Compression Stockings Misc 25-25mHG compression stocking knee high. Apply every morning. Take off every night.  Dx lymphedema What changed:   how much to take  how to take this  when to take this    metoprolol tartrate 50 MG tablet Commonly known as: LOPRESSOR TAKE 1 TABLET BY MOUTH TWICE A DAY   nortriptyline 25 MG capsule Commonly known as: PAMELOR TAKE ONE CAPSULE IN THE MORNING AND TWO CAPSULES AT BEDTIME. What changed: See the new instructions.   NovoLOG FlexPen 100 UNIT/ML FlexPen Generic drug: insulin aspart Inject 7 Units into the skin See admin instructions. INJECT 8 UNITS THREE TIMES A DAY UP TO 16 UNITS/DAY SUBCUTANEOUSLY   Pen Needles 32G X 6 MM Misc 1 each by Does not apply route at bedtime.   POTASSIUM PO Take 1 tablet by mouth daily. [place]   tiZANidine 4 MG tablet Commonly known as: ZANAFLEX Take 1  tablet (4 mg total) by mouth every 6 (six) hours as needed for muscle spasms.   Vitamin D (Ergocalciferol) 1.25 MG (50000 UNIT) Caps capsule Commonly known as: DRISDOL Take 1 capsule (50,000 Units total) by mouth every 7 (seven) days.       Signature:  Chesley Mires, MD [place] Pager - [phone] 11/26/2020, 11:39 AM

## 2020-11-26 NOTE — Patient Instructions (Signed)
Follow up in 6 months 

## 2020-11-29 ENCOUNTER — Other Ambulatory Visit: Payer: Self-pay

## 2020-11-29 ENCOUNTER — Ambulatory Visit: Payer: 59 | Admitting: Family Medicine

## 2020-11-29 DIAGNOSIS — E785 Hyperlipidemia, unspecified: Secondary | ICD-10-CM

## 2020-11-29 MED ORDER — ATORVASTATIN CALCIUM 20 MG PO TABS: 20.0000 mg | ORAL_TABLET | Freq: Every day | ORAL | 1 refills | Status: DC

## 2020-11-29 MED ORDER — AMLODIPINE BESYLATE 10 MG PO TABS: 10.0000 mg | ORAL_TABLET | Freq: Every day | ORAL | 1 refills | Status: DC

## 2020-11-29 MED ORDER — CHLORTHALIDONE 25 MG PO TABS: 25.0000 mg | ORAL_TABLET | Freq: Every day | ORAL | 1 refills | Status: DC

## 2020-11-29 NOTE — Telephone Encounter (Signed)
Sent today by provider

## 2020-12-01 NOTE — Telephone Encounter (Signed)
Patient had completed office visit with Dr Craige Cotta on 11/26/2020, where Dr Craige Cotta went over HST results with patient. Nothing further needed at this time.

## 2020-12-06 ENCOUNTER — Ambulatory Visit: Payer: 59 | Admitting: Family Medicine

## 2020-12-07 ENCOUNTER — Encounter: Payer: Self-pay | Admitting: Family Medicine

## 2020-12-09 ENCOUNTER — Encounter: Payer: Self-pay | Admitting: Family Medicine

## 2020-12-13 ENCOUNTER — Encounter: Payer: Self-pay | Admitting: Gastroenterology

## 2021-01-04 ENCOUNTER — Other Ambulatory Visit: Payer: Self-pay | Admitting: Family Medicine

## 2021-01-04 NOTE — Telephone Encounter (Signed)
Requested medications are due for refill today.  yes  Requested medications are on the active medications list.  yes  Last refill. 10/11/2020  Future visit scheduled.   yes  Notes to clinic.  No delegated

## 2021-01-04 NOTE — Telephone Encounter (Signed)
Patient is requesting a refill of the following medications: Requested Prescriptions   Pending Prescriptions Disp Refills  . tiZANidine (ZANAFLEX) 4 MG tablet [Pharmacy Med Name: TIZANIDINE HCL 4 MG TABLET] 90 tablet 3    Sig: TAKE 1 TABLET BY MOUTH EVERY 6 HOURS AS NEEDED FOR MUSCLE SPASMS.    Date of patient request: 01/04/2021 Last office visit: 11/23/2020 Date of last refill: 90 x 3 refill Last refill amount: 10/11/2020 Follow up time period per chart: 3 month

## 2021-01-17 ENCOUNTER — Ambulatory Visit (INDEPENDENT_AMBULATORY_CARE_PROVIDER_SITE_OTHER): Payer: 59 | Admitting: Gastroenterology

## 2021-01-17 ENCOUNTER — Encounter: Payer: Self-pay | Admitting: Gastroenterology

## 2021-01-17 VITALS — BP 118/70 | HR 78 | Ht 66.0 in | Wt 241.0 lb

## 2021-01-17 DIAGNOSIS — K5900 Constipation, unspecified: Secondary | ICD-10-CM

## 2021-01-17 NOTE — Patient Instructions (Addendum)
If you are age 66 or older, your body mass index should be between 23-30. Your Body mass index is 38.9 kg/m. If this is out of the aforementioned range listed, please consider follow up with your Primary Care Provider.  CONTINUE: over-the-counter bowel regimen for constipation.  Stay hydrated by drinking plenty of fluids each day.  You are in for a recall colonoscopy in January 2025. We will contact you to schedule appointment.  You will follow up with [place] in the office on an as needed basis.   Thank you for entrusting me with your care and choosing [facility].  Dr Christella Hartigan
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
## 2021-02-21 ENCOUNTER — Ambulatory Visit: Payer: 59 | Admitting: Family Medicine

## 2021-03-05 ENCOUNTER — Ambulatory Visit (HOSPITAL_COMMUNITY)
Admission: EM | Admit: 2021-03-05 | Discharge: 2021-03-05 | Disposition: A | Discharge status: Home / Self Care | Payer: Medicare Other | Attending: Family Medicine | Admitting: Family Medicine

## 2021-03-05 ENCOUNTER — Other Ambulatory Visit: Payer: Self-pay | Admitting: Family Medicine

## 2021-03-05 ENCOUNTER — Encounter (HOSPITAL_COMMUNITY): Payer: Self-pay

## 2021-03-05 ENCOUNTER — Other Ambulatory Visit: Payer: Self-pay

## 2021-03-05 ENCOUNTER — Ambulatory Visit (INDEPENDENT_AMBULATORY_CARE_PROVIDER_SITE_OTHER): Payer: Medicare Other

## 2021-03-05 DIAGNOSIS — M1712 Unilateral primary osteoarthritis, left knee: Secondary | ICD-10-CM

## 2021-03-05 DIAGNOSIS — M19041 Primary osteoarthritis, right hand: Secondary | ICD-10-CM

## 2021-03-05 DIAGNOSIS — M25562 Pain in left knee: Secondary | ICD-10-CM

## 2021-03-05 DIAGNOSIS — M25541 Pain in joints of right hand: Secondary | ICD-10-CM | POA: Diagnosis not present

## 2021-03-05 DIAGNOSIS — M25462 Effusion, left knee: Secondary | ICD-10-CM | POA: Diagnosis not present

## 2021-03-05 DIAGNOSIS — I129 Hypertensive chronic kidney disease with stage 1 through stage 4 chronic kidney disease, or unspecified chronic kidney disease: Secondary | ICD-10-CM

## 2021-03-05 DIAGNOSIS — M7989 Other specified soft tissue disorders: Secondary | ICD-10-CM

## 2021-03-05 DIAGNOSIS — E1165 Type 2 diabetes mellitus with hyperglycemia: Secondary | ICD-10-CM

## 2021-03-05 DIAGNOSIS — Z794 Long term (current) use of insulin: Secondary | ICD-10-CM

## 2021-03-05 MED ORDER — DICLOFENAC SODIUM 1 % EX GEL: 4.0000 g | Freq: Four times a day (QID) | CUTANEOUS | 2 refills | Status: DC

## 2021-03-05 MED ORDER — TRAMADOL HCL 50 MG PO TABS: 50.0000 mg | ORAL_TABLET | Freq: Two times a day (BID) | ORAL | 0 refills | Status: DC | PRN

## 2021-03-05 NOTE — ED Triage Notes (Signed)
Pt present pain in both knee's and right hand. Pt state the pain in her right hand is burning and swollen. The pain in both knee's feels like someone is chopping them off with an ax.
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
## 2021-04-15 ENCOUNTER — Ambulatory Visit: Payer: 59 | Admitting: Internal Medicine

## 2021-04-18 ENCOUNTER — Emergency Department (HOSPITAL_COMMUNITY)
Admission: EM | Admit: 2021-04-18 | Discharge: 2021-04-19 | Disposition: A | Discharge status: Home / Self Care | Payer: 59 | Attending: Emergency Medicine | Admitting: Emergency Medicine

## 2021-04-18 ENCOUNTER — Emergency Department (HOSPITAL_COMMUNITY): Payer: 59

## 2021-04-18 ENCOUNTER — Encounter (HOSPITAL_COMMUNITY): Payer: Self-pay | Admitting: Emergency Medicine

## 2021-04-18 ENCOUNTER — Other Ambulatory Visit: Payer: Self-pay

## 2021-04-18 DIAGNOSIS — Z87891 Personal history of nicotine dependence: Secondary | ICD-10-CM | POA: Insufficient documentation

## 2021-04-18 DIAGNOSIS — R519 Headache, unspecified: Secondary | ICD-10-CM | POA: Insufficient documentation

## 2021-04-18 DIAGNOSIS — N183 Chronic kidney disease, stage 3 unspecified: Secondary | ICD-10-CM | POA: Diagnosis not present

## 2021-04-18 DIAGNOSIS — M542 Cervicalgia: Secondary | ICD-10-CM | POA: Insufficient documentation

## 2021-04-18 DIAGNOSIS — I5032 Chronic diastolic (congestive) heart failure: Secondary | ICD-10-CM | POA: Insufficient documentation

## 2021-04-18 DIAGNOSIS — R1010 Upper abdominal pain, unspecified: Secondary | ICD-10-CM | POA: Diagnosis not present

## 2021-04-18 DIAGNOSIS — Y92410 Unspecified street and highway as the place of occurrence of the external cause: Secondary | ICD-10-CM | POA: Insufficient documentation

## 2021-04-18 DIAGNOSIS — Z96641 Presence of right artificial hip joint: Secondary | ICD-10-CM | POA: Insufficient documentation

## 2021-04-18 DIAGNOSIS — Z7984 Long term (current) use of oral hypoglycemic drugs: Secondary | ICD-10-CM | POA: Insufficient documentation

## 2021-04-18 DIAGNOSIS — Z79899 Other long term (current) drug therapy: Secondary | ICD-10-CM | POA: Insufficient documentation

## 2021-04-18 DIAGNOSIS — Z794 Long term (current) use of insulin: Secondary | ICD-10-CM | POA: Insufficient documentation

## 2021-04-18 DIAGNOSIS — S32029A Unspecified fracture of second lumbar vertebra, initial encounter for closed fracture: Secondary | ICD-10-CM | POA: Diagnosis not present

## 2021-04-18 DIAGNOSIS — R0789 Other chest pain: Secondary | ICD-10-CM | POA: Diagnosis not present

## 2021-04-18 DIAGNOSIS — I130 Hypertensive heart and chronic kidney disease with heart failure and stage 1 through stage 4 chronic kidney disease, or unspecified chronic kidney disease: Secondary | ICD-10-CM | POA: Insufficient documentation

## 2021-04-18 DIAGNOSIS — E1122 Type 2 diabetes mellitus with diabetic chronic kidney disease: Secondary | ICD-10-CM | POA: Insufficient documentation

## 2021-04-18 DIAGNOSIS — S3992XA Unspecified injury of lower back, initial encounter: Secondary | ICD-10-CM | POA: Diagnosis present

## 2021-04-18 DIAGNOSIS — S32039A Unspecified fracture of third lumbar vertebra, initial encounter for closed fracture: Secondary | ICD-10-CM | POA: Insufficient documentation

## 2021-04-18 DIAGNOSIS — S32009A Unspecified fracture of unspecified lumbar vertebra, initial encounter for closed fracture: Secondary | ICD-10-CM

## 2021-04-18 LAB — CBC WITH DIFFERENTIAL/PLATELET
Abs Immature Granulocytes: 0.03 10*3/uL (ref 0.00–0.07)
Basophils Absolute: 0 10*3/uL (ref 0.0–0.1)
Basophils Relative: 1 %
Eosinophils Absolute: 0.2 10*3/uL (ref 0.0–0.5)
Eosinophils Relative: 2 %
HCT: 37.5 % (ref 36.0–46.0)
Hemoglobin: 11.6 g/dL — ABNORMAL LOW (ref 12.0–15.0)
Immature Granulocytes: 1 %
Lymphocytes Relative: 24 %
Lymphs Abs: 1.6 10*3/uL (ref 0.7–4.0)
MCH: 28 pg (ref 26.0–34.0)
MCHC: 30.9 g/dL (ref 30.0–36.0)
MCV: 90.4 fL (ref 80.0–100.0)
Monocytes Absolute: 0.5 10*3/uL (ref 0.1–1.0)
Monocytes Relative: 7 %
Neutro Abs: 4.4 10*3/uL (ref 1.7–7.7)
Neutrophils Relative %: 65 %
Platelets: 281 10*3/uL (ref 150–400)
RBC: 4.15 MIL/uL (ref 3.87–5.11)
RDW: 14.5 % (ref 11.5–15.5)
WBC: 6.6 10*3/uL (ref 4.0–10.5)
nRBC: 0 % (ref 0.0–0.2)

## 2021-04-18 LAB — COMPREHENSIVE METABOLIC PANEL
ALT: 15 U/L (ref 0–44)
AST: 16 U/L (ref 15–41)
Albumin: 4.1 g/dL (ref 3.5–5.0)
Alkaline Phosphatase: 63 U/L (ref 38–126)
Anion gap: 6 (ref 5–15)
BUN: 21 mg/dL (ref 8–23)
CO2: 29 mmol/L (ref 22–32)
Calcium: 9.3 mg/dL (ref 8.9–10.3)
Chloride: 102 mmol/L (ref 98–111)
Creatinine, Ser: 1.2 mg/dL — ABNORMAL HIGH (ref 0.44–1.00)
GFR, Estimated: 50 mL/min — ABNORMAL LOW (ref >60)
Glucose, Bld: 154 mg/dL — ABNORMAL HIGH (ref 70–99)
Potassium: 4 mmol/L (ref 3.5–5.1)
Sodium: 137 mmol/L (ref 135–145)
Total Bilirubin: 0.5 mg/dL (ref 0.3–1.2)
Total Protein: 7.5 g/dL (ref 6.5–8.1)

## 2021-04-18 LAB — LIPASE, BLOOD: Lipase: 20 U/L (ref 11–51)

## 2021-04-18 MED ORDER — METHOCARBAMOL 500 MG PO TABS
1000.0000 mg | ORAL_TABLET | Freq: Once | ORAL | Status: AC
  Administered 2021-04-18: 1000 mg via ORAL
  Filled 2021-04-18: qty 2

## 2021-04-18 MED ORDER — MORPHINE SULFATE (PF) 4 MG/ML IV SOLN
4.0000 mg | Freq: Once | INTRAVENOUS | Status: AC
Start: 2021-04-18 — End: 2021-04-18
  Administered 2021-04-18: 4 mg via INTRAVENOUS
  Filled 2021-04-18: qty 1
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
## 2021-04-19 ENCOUNTER — Encounter (HOSPITAL_COMMUNITY): Payer: Self-pay

## 2021-04-19 ENCOUNTER — Emergency Department (HOSPITAL_COMMUNITY): Payer: 59

## 2021-04-19 ENCOUNTER — Telehealth (INDEPENDENT_AMBULATORY_CARE_PROVIDER_SITE_OTHER): Payer: Medicare Other | Admitting: Internal Medicine

## 2021-04-19 ENCOUNTER — Other Ambulatory Visit: Payer: Self-pay

## 2021-04-19 DIAGNOSIS — I129 Hypertensive chronic kidney disease with stage 1 through stage 4 chronic kidney disease, or unspecified chronic kidney disease: Secondary | ICD-10-CM

## 2021-04-19 DIAGNOSIS — E1149 Type 2 diabetes mellitus with other diabetic neurological complication: Secondary | ICD-10-CM

## 2021-04-19 DIAGNOSIS — Z7689 Persons encountering health services in other specified circumstances: Secondary | ICD-10-CM

## 2021-04-19 DIAGNOSIS — M170 Bilateral primary osteoarthritis of knee: Secondary | ICD-10-CM

## 2021-04-19 MED ORDER — HYDROCODONE-ACETAMINOPHEN 5-325 MG PO TABS: 1.0000 | ORAL_TABLET | Freq: Four times a day (QID) | ORAL | 0 refills | Status: DC | PRN

## 2021-04-19 MED ORDER — HYDROMORPHONE HCL 1 MG/ML IJ SOLN
1.0000 mg | Freq: Once | INTRAMUSCULAR | Status: AC
  Administered 2021-04-19: 1 mg via INTRAVENOUS
  Filled 2021-04-19: qty 1

## 2021-04-19 MED ORDER — NOVOLOG FLEXPEN 100 UNIT/ML ~~LOC~~ SOPN: 7.0000 [IU] | PEN_INJECTOR | SUBCUTANEOUS | 3 refills | Status: DC

## 2021-04-19 MED ORDER — SODIUM CHLORIDE (PF) 0.9 % IJ SOLN
INTRAMUSCULAR | Status: AC
  Filled 2021-04-19: qty 50

## 2021-04-19 MED ORDER — LIDOCAINE 5 % EX PTCH: 1.0000 | MEDICATED_PATCH | Freq: Every day | CUTANEOUS | 0 refills | Status: DC | PRN

## 2021-04-19 MED ORDER — IOHEXOL 300 MG/ML  SOLN
100.0000 mL | Freq: Once | INTRAMUSCULAR | Status: AC | PRN
  Administered 2021-04-19: 100 mL via INTRAVENOUS

## 2021-04-19 MED ORDER — CHLORTHALIDONE 25 MG PO TABS: 25.0000 mg | ORAL_TABLET | Freq: Every day | ORAL | 1 refills | Status: DC
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
## 2021-04-19 NOTE — ED Notes (Signed)
Pt verbalized understanding of d/c, medication, and follow up care.  Pt. ambulatory
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
## 2021-04-25 ENCOUNTER — Telehealth: Payer: Self-pay | Admitting: Internal Medicine

## 2021-04-25 NOTE — Telephone Encounter (Signed)
Called patient and LVM advising patient I was calling from [facility] in regards to her upcoming appointment on 04/27/21 at 2pm. Advised unfortunately the provider is going to be out of the office and she needs to call [phone] to reschedule. If patient calls back please transfer to office for reschedule.

## 2021-04-27 ENCOUNTER — Ambulatory Visit: Payer: Medicare Other | Admitting: Pharmacist

## 2021-05-02 ENCOUNTER — Ambulatory Visit: Payer: Medicare Other | Attending: Internal Medicine | Admitting: Pharmacist

## 2021-05-02 ENCOUNTER — Other Ambulatory Visit: Payer: Self-pay

## 2021-05-02 ENCOUNTER — Encounter: Payer: Self-pay | Admitting: Pharmacist

## 2021-05-02 DIAGNOSIS — E1165 Type 2 diabetes mellitus with hyperglycemia: Secondary | ICD-10-CM | POA: Diagnosis not present

## 2021-05-02 DIAGNOSIS — Z794 Long term (current) use of insulin: Secondary | ICD-10-CM | POA: Diagnosis not present

## 2021-05-02 LAB — GLUCOSE, POCT (MANUAL RESULT ENTRY): POC Glucose: 133 mg/dl — AB (ref 70–99)

## 2021-05-02 MED ORDER — BASAGLAR KWIKPEN 100 UNIT/ML ~~LOC~~ SOPN: 22.0000 [IU] | PEN_INJECTOR | Freq: Every day | SUBCUTANEOUS | 3 refills | Status: DC

## 2021-05-02 MED ORDER — METFORMIN HCL ER 500 MG PO TB24
500.0000 mg | ORAL_TABLET | Freq: Every day | ORAL | 0 refills | Status: DC
Start: 2021-05-02 — End: 2021-07-05

## 2021-05-02 NOTE — Progress Notes (Cosign Needed)
    S:    PCP: Dr. Earlene Plater   No chief complaint on file.  Patient arrives in good spirits. Presents for diabetes evaluation, education, and management. Patient was referred and last seen by Primary Care Provider on 04/19/2021.   Patient reports Diabetes was diagnosed >10 years ago. She used to take metformin but is currently on basal/bolus insulin in addition to [place]. She denies any hx of DKA. She does have CKD and chronic diastolic heart failure. Denies any hx of stroke or MI.   Family/Social History:  -Fhx: DM, heart disease -Tobacco: former smoker -Alcohol use: none   Insurance coverage/medication affordability: BCBS  Medication adherence reported, however, she admits that she doesn't take her Novolog as strictly as she should.   Current diabetes medications include: Basaglar 22 units daily, Farxiga 10 mg daily, Novolog per sliding scale  Patient denies hypoglycemic events.  Patient reported dietary habits:  -Admits to dietary indiscretion  Patient-reported exercise habits:  -Exercise is limited d/t pain   Patient denies nocturia (nighttime urination).  Patient reports neuropathy (nerve pain). Patient reports visual changes. Patient denies self foot exams.     O:  POCT: 133   Lab Results  Component Value Date   HGBA1C 8.7 (H) 11/23/2020   There were no vitals filed for this visit.  Lipid Panel     Component Value Date/Time   CHOL 205 (H) 11/23/2020 1112   TRIG 88 11/23/2020 1112   HDL 67 11/23/2020 1112   CHOLHDL 3.1 11/23/2020 1112   CHOLHDL 3.8 07/11/2011 0842   VLDL 38 07/11/2011 0842   LDLCALC 122 (H) 11/23/2020 1112    Home fasting blood sugars: none   2 hour post-meal/random blood sugars: none.  Clinical Atherosclerotic Cardiovascular Disease (ASCVD): No  The 10-year ASCVD risk score Denman George DC Jr., et al., 2013) is: 32.5%   Values used to calculate the score:     Age: 65 years     Sex: Female     Is Non-Hispanic African American: Yes      Diabetic: Yes     Tobacco smoker: No     Systolic Blood Pressure: 161 mmHg     Is BP treated: Yes     HDL Cholesterol: 67 mg/dL     Total Cholesterol: 205 mg/dL    A/P: Diabetes longstanding currently uncontrolled. Patient is able to verbalize appropriate hypoglycemia management plan. Medication adherence appears suboptimal.   Would like to add a GLP-1 RA today, however, pt tells me she has a hx of pancreatitis. I cannot find this in her chart. She has a hx of CKD with Scr peaking in 06/09/20 at 1.71. Most recently, her Scr has shown improvement (1.20 on 04/18/2021). GFR is not contraindicative to metformin at this time. Will start 500 mg XR daily to see how she tolerates. Will continue Newberg, Yeguada, and Novolog at current doses. -Started metformin 500 mg XR daily.  -Continued Basaglar 22 units daily.  -Continued Novolog per sliding scale.  -Extensively discussed pathophysiology of diabetes, recommended lifestyle interventions, dietary effects on blood sugar control -Counseled on s/sx of and management of hypoglycemia -A1c - will get at next visit.    Written patient instructions provided.  Total time in face to face counseling 30 minutes.   Follow up Pharmacist Clinic Visit in 1 month.     Butch Penny, PharmD, P[place]y [place], CPP Clinical Pharmacist [facility] [phone]

## 2021-05-03 ENCOUNTER — Ambulatory Visit (HOSPITAL_COMMUNITY)
Admission: EM | Admit: 2021-05-03 | Discharge: 2021-05-03 | Disposition: A | Discharge status: Home / Self Care | Payer: Medicare Other | Attending: Physician Assistant | Admitting: Physician Assistant

## 2021-05-03 ENCOUNTER — Telehealth: Payer: Self-pay | Admitting: Internal Medicine

## 2021-05-03 ENCOUNTER — Emergency Department (HOSPITAL_COMMUNITY): Payer: 59

## 2021-05-03 ENCOUNTER — Emergency Department (HOSPITAL_COMMUNITY)
Admission: EM | Admit: 2021-05-03 | Discharge: 2021-05-03 | Disposition: A | Discharge status: Home / Self Care | Payer: 59 | Attending: Emergency Medicine | Admitting: Emergency Medicine

## 2021-05-03 DIAGNOSIS — N183 Chronic kidney disease, stage 3 unspecified: Secondary | ICD-10-CM | POA: Diagnosis not present

## 2021-05-03 DIAGNOSIS — R5383 Other fatigue: Secondary | ICD-10-CM | POA: Diagnosis not present

## 2021-05-03 DIAGNOSIS — E1122 Type 2 diabetes mellitus with diabetic chronic kidney disease: Secondary | ICD-10-CM | POA: Diagnosis not present

## 2021-05-03 DIAGNOSIS — E1149 Type 2 diabetes mellitus with other diabetic neurological complication: Secondary | ICD-10-CM | POA: Diagnosis not present

## 2021-05-03 DIAGNOSIS — R519 Headache, unspecified: Secondary | ICD-10-CM | POA: Diagnosis not present

## 2021-05-03 DIAGNOSIS — Z7984 Long term (current) use of oral hypoglycemic drugs: Secondary | ICD-10-CM | POA: Insufficient documentation

## 2021-05-03 DIAGNOSIS — S060X0A Concussion without loss of consciousness, initial encounter: Secondary | ICD-10-CM

## 2021-05-03 DIAGNOSIS — Z794 Long term (current) use of insulin: Secondary | ICD-10-CM | POA: Diagnosis not present

## 2021-05-03 DIAGNOSIS — I5032 Chronic diastolic (congestive) heart failure: Secondary | ICD-10-CM | POA: Insufficient documentation

## 2021-05-03 DIAGNOSIS — I130 Hypertensive heart and chronic kidney disease with heart failure and stage 1 through stage 4 chronic kidney disease, or unspecified chronic kidney disease: Secondary | ICD-10-CM | POA: Insufficient documentation

## 2021-05-03 DIAGNOSIS — R079 Chest pain, unspecified: Secondary | ICD-10-CM | POA: Insufficient documentation

## 2021-05-03 DIAGNOSIS — Z79899 Other long term (current) drug therapy: Secondary | ICD-10-CM | POA: Diagnosis not present

## 2021-05-03 DIAGNOSIS — I10 Essential (primary) hypertension: Secondary | ICD-10-CM

## 2021-05-03 DIAGNOSIS — I160 Hypertensive urgency: Secondary | ICD-10-CM

## 2021-05-03 DIAGNOSIS — Z87891 Personal history of nicotine dependence: Secondary | ICD-10-CM | POA: Diagnosis not present

## 2021-05-03 DIAGNOSIS — Z96641 Presence of right artificial hip joint: Secondary | ICD-10-CM | POA: Insufficient documentation

## 2021-05-03 LAB — CBC WITH DIFFERENTIAL/PLATELET
Abs Immature Granulocytes: 0.01 10*3/uL (ref 0.00–0.07)
Basophils Absolute: 0.1 10*3/uL (ref 0.0–0.1)
Basophils Relative: 1 %
Eosinophils Absolute: 0.2 10*3/uL (ref 0.0–0.5)
Eosinophils Relative: 5 %
HCT: 40.8 % (ref 36.0–46.0)
Hemoglobin: 12.5 g/dL (ref 12.0–15.0)
Immature Granulocytes: 0 %
Lymphocytes Relative: 37 %
Lymphs Abs: 1.7 10*3/uL (ref 0.7–4.0)
MCH: 27.7 pg (ref 26.0–34.0)
MCHC: 30.6 g/dL (ref 30.0–36.0)
MCV: 90.3 fL (ref 80.0–100.0)
Monocytes Absolute: 0.4 10*3/uL (ref 0.1–1.0)
Monocytes Relative: 9 %
Neutro Abs: 2.2 10*3/uL (ref 1.7–7.7)
Neutrophils Relative %: 48 %
Platelets: 336 10*3/uL (ref 150–400)
RBC: 4.52 MIL/uL (ref 3.87–5.11)
RDW: 13.9 % (ref 11.5–15.5)
WBC: 4.6 10*3/uL (ref 4.0–10.5)
nRBC: 0 % (ref 0.0–0.2)

## 2021-05-03 LAB — COMPREHENSIVE METABOLIC PANEL
ALT: 15 U/L (ref 0–44)
AST: 15 U/L (ref 15–41)
Albumin: 3.8 g/dL (ref 3.5–5.0)
Alkaline Phosphatase: 79 U/L (ref 38–126)
Anion gap: 8 (ref 5–15)
BUN: 19 mg/dL (ref 8–23)
CO2: 28 mmol/L (ref 22–32)
Calcium: 9.5 mg/dL (ref 8.9–10.3)
Chloride: 103 mmol/L (ref 98–111)
Creatinine, Ser: 1.28 mg/dL — ABNORMAL HIGH (ref 0.44–1.00)
GFR, Estimated: 46 mL/min — ABNORMAL LOW (ref >60)
Glucose, Bld: 102 mg/dL — ABNORMAL HIGH (ref 70–99)
Potassium: 4.1 mmol/L (ref 3.5–5.1)
Sodium: 139 mmol/L (ref 135–145)
Total Bilirubin: 0.4 mg/dL (ref 0.3–1.2)
Total Protein: 7.3 g/dL (ref 6.5–8.1)

## 2021-05-03 LAB — TROPONIN I (HIGH SENSITIVITY)
Troponin I (High Sensitivity): 8 ng/L (ref <18)
Troponin I (High Sensitivity): 7 ng/L (ref <18)

## 2021-05-03 MED ORDER — HYDROMORPHONE HCL 1 MG/ML IJ SOLN
1.0000 mg | Freq: Once | INTRAMUSCULAR | Status: AC
  Administered 2021-05-03: 1 mg via INTRAVENOUS
  Filled 2021-05-03: qty 1

## 2021-05-03 MED ORDER — SODIUM CHLORIDE 0.9 % IV BOLUS
500.0000 mL | Freq: Once | INTRAVENOUS | Status: AC
Start: 2021-05-03 — End: 2021-05-03
  Administered 2021-05-03: 500 mL via INTRAVENOUS

## 2021-05-03 MED ORDER — AMLODIPINE BESYLATE 5 MG PO TABS
10.0000 mg | ORAL_TABLET | Freq: Once | ORAL | Status: AC
  Administered 2021-05-03: 10 mg via ORAL
  Filled 2021-05-03: qty 2

## 2021-05-03 MED ORDER — HYDRALAZINE HCL 10 MG PO TABS
10.0000 mg | ORAL_TABLET | Freq: Once | ORAL | Status: AC
  Administered 2021-05-03: 10 mg via ORAL
  Filled 2021-05-03: qty 1

## 2021-05-03 MED ORDER — HYDROCODONE-ACETAMINOPHEN 5-325 MG PO TABS: 1.0000 | ORAL_TABLET | Freq: Four times a day (QID) | ORAL | 0 refills | Status: AC | PRN

## 2021-05-03 MED ORDER — DIPHENHYDRAMINE HCL 50 MG/ML IJ SOLN
12.5000 mg | Freq: Once | INTRAMUSCULAR | Status: AC
  Administered 2021-05-03: 12.5 mg via INTRAVENOUS
  Filled 2021-05-03: qty 1

## 2021-05-03 MED ORDER — METOCLOPRAMIDE HCL 5 MG/ML IJ SOLN
5.0000 mg | Freq: Once | INTRAMUSCULAR | Status: AC
  Administered 2021-05-03: 5 mg via INTRAVENOUS
  Filled 2021-05-03: qty 2
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
## 2021-05-03 NOTE — ED Notes (Signed)
Patient is being discharged from the Urgent Care and sent to the Emergency Department via wheelchair with staff. Per Sharmon Revere PA, patient is in need of higher level of care due to hypertension, confusion and headache. Patient is aware and verbalizes understanding of plan of care.  Vitals:   05/03/21 1106  BP: (!) 182/102  Pulse: 69  Resp: 18  SpO2: 99%

## 2021-05-03 NOTE — ED Notes (Signed)
Pt ambulatory to restroom and back by herself. Pt back in bed and hooked back up to monitor. Pt reports 4/10 pain and that medicine "helped her a lot"
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
## 2021-05-03 NOTE — ED Notes (Signed)
Patient verbalizes understanding of discharge instructions. Opportunity for questioning and answers were provided. Armband removed by staff, pt discharged from ED ambulatory.   
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
## 2021-05-03 NOTE — ED Notes (Signed)
MD cleared pt to take her home meds for back pain. Pt reports taking pain meds and muscle relaxer.

## 2021-05-03 NOTE — Discharge Instructions (Addendum)
Sent to ED for further evaluation of HTN urgency

## 2021-05-05 NOTE — Telephone Encounter (Signed)
PCP can address at 6/27 visit..has not been seen by current provider

## 2021-05-05 NOTE — Telephone Encounter (Signed)
Pt called stating she has not been able to sleep and is asking if something can be prescribed for her. Please advise.
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
## 2021-05-10 ENCOUNTER — Ambulatory Visit (INDEPENDENT_AMBULATORY_CARE_PROVIDER_SITE_OTHER): Payer: Medicare Other | Admitting: Family

## 2021-05-10 ENCOUNTER — Other Ambulatory Visit: Payer: Self-pay

## 2021-05-10 ENCOUNTER — Encounter: Payer: Self-pay | Admitting: Family

## 2021-05-10 VITALS — BP 165/100 | HR 66 | Temp 98.1°F | Resp 16 | Ht 65.98 in | Wt 259.0 lb

## 2021-05-10 DIAGNOSIS — S060X0D Concussion without loss of consciousness, subsequent encounter: Secondary | ICD-10-CM | POA: Diagnosis not present

## 2021-05-10 DIAGNOSIS — E1149 Type 2 diabetes mellitus with other diabetic neurological complication: Secondary | ICD-10-CM | POA: Diagnosis not present

## 2021-05-10 DIAGNOSIS — I129 Hypertensive chronic kidney disease with stage 1 through stage 4 chronic kidney disease, or unspecified chronic kidney disease: Secondary | ICD-10-CM | POA: Diagnosis not present

## 2021-05-10 DIAGNOSIS — Z09 Encounter for follow-up examination after completed treatment for conditions other than malignant neoplasm: Secondary | ICD-10-CM

## 2021-05-10 DIAGNOSIS — G4700 Insomnia, unspecified: Secondary | ICD-10-CM | POA: Diagnosis not present

## 2021-05-10 DIAGNOSIS — R519 Headache, unspecified: Secondary | ICD-10-CM

## 2021-05-10 LAB — POCT GLYCOSYLATED HEMOGLOBIN (HGB A1C): Hemoglobin A1C: 7.2 % — AB (ref 4.0–5.6)

## 2021-05-10 MED ORDER — HYDRALAZINE HCL 25 MG PO TABS: 25.0000 mg | ORAL_TABLET | Freq: Three times a day (TID) | ORAL | 2 refills | Status: DC

## 2021-05-10 MED ORDER — AMLODIPINE BESYLATE 10 MG PO TABS: 10.0000 mg | ORAL_TABLET | Freq: Every day | ORAL | 0 refills | Status: DC

## 2021-05-10 MED ORDER — CHLORTHALIDONE 25 MG PO TABS: 25.0000 mg | ORAL_TABLET | Freq: Every day | ORAL | 0 refills | Status: DC

## 2021-05-10 MED ORDER — METOPROLOL TARTRATE 50 MG PO TABS: 50.0000 mg | ORAL_TABLET | Freq: Two times a day (BID) | ORAL | 0 refills | Status: DC

## 2021-05-10 NOTE — Patient Instructions (Signed)
Migraine Headache A migraine headache is a very strong throbbing pain on one side or both sides of your head. This type of headache can also cause other symptoms. It can last from 4 hours to 3 days. Talk with your doctor about what things may bring on (trigger) this condition. What are the causes? The exact cause of this condition is not known. This condition may be triggered or caused by: Drinking alcohol. Smoking. Taking medicines, such as: Medicine used to treat chest pain (nitroglycerin). Birth control pills. Estrogen. Some blood pressure medicines. Eating or drinking certain products. Doing physical activity. Other things that may trigger a migraine headache include: Having a menstrual period. Pregnancy. Hunger. Stress. Not getting enough sleep or getting too much sleep. Weather changes. Tiredness (fatigue). What increases the risk? Being 25-55 years old. Being female. Having a family history of migraine headaches. Being Caucasian. Having depression or anxiety. Being very overweight. What are the signs or symptoms? A throbbing pain. This pain may: Happen in any area of the head, such as on one side or both sides. Make it hard to do daily activities. Get worse with physical activity. Get worse around bright lights or loud noises. Other symptoms may include: Feeling sick to your stomach (nauseous). Vomiting. Dizziness. Being sensitive to bright lights, loud noises, or smells. Before you get a migraine headache, you may get warning signs (an aura). An aura may include: Seeing flashing lights or having blind spots. Seeing bright spots, halos, or zigzag lines. Having tunnel vision or blurred vision. Having numbness or a tingling feeling. Having trouble talking. Having weak muscles. Some people have symptoms after a migraine headache (postdromal phase), such as: Tiredness. Trouble thinking (concentrating). How is this treated? Taking medicines that: Relieve  pain. Relieve the feeling of being sick to your stomach. Prevent migraine headaches. Treatment may also include: Having acupuncture. Avoiding foods that bring on migraine headaches. Learning ways to control your body functions (biofeedback). Therapy to help you know and deal with negative thoughts (cognitive behavioral therapy). Follow these instructions at home: Medicines Take over-the-counter and prescription medicines only as told by your doctor. Ask your doctor if the medicine prescribed to you: Requires you to avoid driving or using heavy machinery. Can cause trouble pooping (constipation). You may need to take these steps to prevent or treat trouble pooping: Drink enough fluid to keep your pee (urine) pale yellow. Take over-the-counter or prescription medicines. Eat foods that are high in fiber. These include beans, whole grains, and fresh fruits and vegetables. Limit foods that are high in fat and sugar. These include fried or sweet foods. Lifestyle Do not drink alcohol. Do not use any products that contain nicotine or tobacco, such as cigarettes, e-cigarettes, and chewing tobacco. If you need help quitting, ask your doctor. Get at least 8 hours of sleep every night. Limit and deal with stress. General instructions   Keep a journal to find out what may bring on your migraine headaches. For example, write down: What you eat and drink. How much sleep you get. Any change in what you eat or drink. Any change in your medicines. If you have a migraine headache: Avoid things that make your symptoms worse, such as bright lights. It may help to lie down in a dark, quiet room. Do not drive or use heavy machinery. Ask your doctor what activities are safe for you. Keep all follow-up visits as told by your doctor. This is important. Contact a doctor if: You get a migraine headache that   is different or worse than others you have had. You have more than 15 headache days in one  month. Get help right away if: Your migraine headache gets very bad. Your migraine headache lasts longer than 72 hours. You have a fever. You have a stiff neck. You have trouble seeing. Your muscles feel weak or like you cannot control them. You start to lose your balance a lot. You start to have trouble walking. You pass out (faint). You have a seizure. Summary A migraine headache is a very strong throbbing pain on one side or both sides of your head. These headaches can also cause other symptoms. This condition may be treated with medicines and changes to your lifestyle. Keep a journal to find out what may bring on your migraine headaches. Contact a doctor if you get a migraine headache that is different or worse than others you have had. Contact your doctor if you have more than 15 headache days in a month. This information is not intended to replace advice given to you by your health care provider. Make sure you discuss any questions you have with your health care provider. Document Revised: 02/21/2019 Document Reviewed: 12/12/2018 Elsevier Patient Education  2022 Elsevier Inc.  
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
## 2021-05-11 ENCOUNTER — Telehealth: Payer: Self-pay | Admitting: Family

## 2021-05-11 NOTE — Telephone Encounter (Signed)
Pt was contacted

## 2021-05-11 NOTE — Telephone Encounter (Signed)
Pt called in stating her blood pressure is really high 181/102 and asking what she should do. Please call pt and advice.

## 2021-05-17 ENCOUNTER — Encounter: Payer: Self-pay | Admitting: Family

## 2021-05-18 ENCOUNTER — Emergency Department (HOSPITAL_COMMUNITY)
Admission: EM | Admit: 2021-05-18 | Discharge: 2021-05-18 | Disposition: A | Discharge status: Home / Self Care | Payer: 59 | Attending: Emergency Medicine | Admitting: Emergency Medicine

## 2021-05-18 ENCOUNTER — Emergency Department (HOSPITAL_COMMUNITY): Payer: 59

## 2021-05-18 ENCOUNTER — Other Ambulatory Visit: Payer: Self-pay

## 2021-05-18 ENCOUNTER — Encounter: Payer: Self-pay | Admitting: Family

## 2021-05-18 ENCOUNTER — Encounter (HOSPITAL_COMMUNITY): Payer: Self-pay

## 2021-05-18 DIAGNOSIS — R519 Headache, unspecified: Secondary | ICD-10-CM | POA: Diagnosis not present

## 2021-05-18 DIAGNOSIS — M545 Low back pain, unspecified: Secondary | ICD-10-CM | POA: Diagnosis not present

## 2021-05-18 DIAGNOSIS — Z794 Long term (current) use of insulin: Secondary | ICD-10-CM | POA: Diagnosis not present

## 2021-05-18 DIAGNOSIS — I5032 Chronic diastolic (congestive) heart failure: Secondary | ICD-10-CM | POA: Diagnosis not present

## 2021-05-18 DIAGNOSIS — Z96641 Presence of right artificial hip joint: Secondary | ICD-10-CM | POA: Diagnosis not present

## 2021-05-18 DIAGNOSIS — E1142 Type 2 diabetes mellitus with diabetic polyneuropathy: Secondary | ICD-10-CM | POA: Diagnosis not present

## 2021-05-18 DIAGNOSIS — R030 Elevated blood-pressure reading, without diagnosis of hypertension: Secondary | ICD-10-CM | POA: Diagnosis present

## 2021-05-18 DIAGNOSIS — N183 Chronic kidney disease, stage 3 unspecified: Secondary | ICD-10-CM | POA: Diagnosis not present

## 2021-05-18 DIAGNOSIS — Z87891 Personal history of nicotine dependence: Secondary | ICD-10-CM | POA: Insufficient documentation

## 2021-05-18 DIAGNOSIS — E1122 Type 2 diabetes mellitus with diabetic chronic kidney disease: Secondary | ICD-10-CM | POA: Diagnosis not present

## 2021-05-18 DIAGNOSIS — Z7984 Long term (current) use of oral hypoglycemic drugs: Secondary | ICD-10-CM | POA: Insufficient documentation

## 2021-05-18 DIAGNOSIS — I10 Essential (primary) hypertension: Secondary | ICD-10-CM

## 2021-05-18 DIAGNOSIS — Z79899 Other long term (current) drug therapy: Secondary | ICD-10-CM | POA: Diagnosis not present

## 2021-05-18 DIAGNOSIS — I130 Hypertensive heart and chronic kidney disease with heart failure and stage 1 through stage 4 chronic kidney disease, or unspecified chronic kidney disease: Secondary | ICD-10-CM | POA: Diagnosis not present

## 2021-05-18 LAB — BASIC METABOLIC PANEL
Anion gap: 12 (ref 5–15)
BUN: 32 mg/dL — ABNORMAL HIGH (ref 8–23)
CO2: 24 mmol/L (ref 22–32)
Calcium: 9.7 mg/dL (ref 8.9–10.3)
Chloride: 99 mmol/L (ref 98–111)
Creatinine, Ser: 1.23 mg/dL — ABNORMAL HIGH (ref 0.44–1.00)
GFR, Estimated: 49 mL/min — ABNORMAL LOW (ref >60)
Glucose, Bld: 134 mg/dL — ABNORMAL HIGH (ref 70–99)
Potassium: 4.1 mmol/L (ref 3.5–5.1)
Sodium: 135 mmol/L (ref 135–145)

## 2021-05-18 LAB — URINALYSIS, ROUTINE W REFLEX MICROSCOPIC
Bilirubin Urine: NEGATIVE
Glucose, UA: >=500 mg/dL — AB
Ketones, ur: 5 mg/dL — AB
Leukocytes,Ua: NEGATIVE
Nitrite: NEGATIVE
Protein, ur: 30 mg/dL — AB
Specific Gravity, Urine: 1.006 (ref 1.005–1.030)
pH: 6 (ref 5.0–8.0)

## 2021-05-18 LAB — CBC
HCT: 40.9 % (ref 36.0–46.0)
Hemoglobin: 12.7 g/dL (ref 12.0–15.0)
MCH: 27.5 pg (ref 26.0–34.0)
MCHC: 31.1 g/dL (ref 30.0–36.0)
MCV: 88.5 fL (ref 80.0–100.0)
Platelets: 309 10*3/uL (ref 150–400)
RBC: 4.62 MIL/uL (ref 3.87–5.11)
RDW: 13.9 % (ref 11.5–15.5)
WBC: 4.7 10*3/uL (ref 4.0–10.5)
nRBC: 0 % (ref 0.0–0.2)

## 2021-05-18 LAB — TROPONIN I (HIGH SENSITIVITY): Troponin I (High Sensitivity): 4 ng/L (ref <18)

## 2021-05-18 MED ORDER — METHOCARBAMOL 500 MG PO TABS
500.0000 mg | ORAL_TABLET | Freq: Once | ORAL | Status: AC
  Administered 2021-05-18: 500 mg via ORAL
  Filled 2021-05-18: qty 1

## 2021-05-18 MED ORDER — DIPHENHYDRAMINE HCL 50 MG/ML IJ SOLN
12.5000 mg | Freq: Once | INTRAMUSCULAR | Status: AC
  Administered 2021-05-18: 12.5 mg via INTRAVENOUS
  Filled 2021-05-18: qty 1

## 2021-05-18 MED ORDER — METOCLOPRAMIDE HCL 5 MG/ML IJ SOLN
10.0000 mg | Freq: Once | INTRAMUSCULAR | Status: AC
  Administered 2021-05-18: 10 mg via INTRAVENOUS
  Filled 2021-05-18: qty 2

## 2021-05-18 MED ORDER — HYDROCODONE-ACETAMINOPHEN 5-325 MG PO TABS: 2.0000 | ORAL_TABLET | Freq: Four times a day (QID) | ORAL | 0 refills | Status: AC | PRN

## 2021-05-18 MED ORDER — HYDROCODONE-ACETAMINOPHEN 5-325 MG PO TABS
2.0000 | ORAL_TABLET | Freq: Once | ORAL | Status: AC
Start: 2021-05-18 — End: 2021-05-18
  Administered 2021-05-18: 2 via ORAL
  Filled 2021-05-18: qty 2

## 2021-05-18 MED ORDER — HYDRALAZINE HCL 25 MG PO TABS
25.0000 mg | ORAL_TABLET | Freq: Once | ORAL | Status: AC
  Administered 2021-05-18: 25 mg via ORAL
  Filled 2021-05-18: qty 1

## 2021-05-18 NOTE — Discharge Instructions (Addendum)
1. Medications: increase vicodin to 2 tablets as needed for severe pain, continue your increased dose of hydralazine, usual home medications 2. Treatment: rest, drink plenty of fluids, gentle stretching 3. Follow Up: Please followup with your primary doctor asap about continued pain and HTN; Please return to the ER for new or worsening symptoms
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
## 2021-05-19 ENCOUNTER — Encounter: Payer: Self-pay | Admitting: Family

## 2021-05-19 ENCOUNTER — Other Ambulatory Visit: Payer: Self-pay | Admitting: Family

## 2021-05-19 ENCOUNTER — Telehealth: Payer: Self-pay | Admitting: Family

## 2021-05-19 ENCOUNTER — Telehealth: Payer: Self-pay

## 2021-05-19 DIAGNOSIS — M545 Low back pain, unspecified: Secondary | ICD-10-CM

## 2021-05-19 DIAGNOSIS — I129 Hypertensive chronic kidney disease with stage 1 through stage 4 chronic kidney disease, or unspecified chronic kidney disease: Secondary | ICD-10-CM

## 2021-05-19 MED ORDER — HYDRALAZINE HCL 25 MG PO TABS: 25.0000 mg | ORAL_TABLET | Freq: Four times a day (QID) | ORAL | 0 refills | Status: DC

## 2021-05-19 NOTE — Telephone Encounter (Signed)
Thank you Eboney.

## 2021-05-19 NOTE — Telephone Encounter (Signed)
Patient called with concerns for her hypertension referral.  Patient has an appointment scheduled 07/05/21, but is not satisfied with having to wait that long to be seen.  Patient requested to be contacted to see if she could try to get another referral to be able to be seen elsewhere at a sooner date.

## 2021-05-19 NOTE — Telephone Encounter (Signed)
Spoke w/pt to advise that we can schedule her an appt with a clinic pharmacist to discuss hypertension and medication management and check out BP monitor to review readings and make sure its working properly. Pt states there is nothing wrong with her BP monitor, there's no reason she should discuss medications with a pharmacist, she stated she needs to see a heart doctor and we are not trying to help her, pt declined any appts stated she is done with our office and she will find a heart dr that accepts her insurance to find a sooner appt than her referral for Duke Salvia, MD on 8/23

## 2021-06-07 ENCOUNTER — Ambulatory Visit: Payer: Medicare Other | Admitting: Dietician

## 2021-07-05 ENCOUNTER — Other Ambulatory Visit: Payer: Self-pay

## 2021-07-05 ENCOUNTER — Encounter: Payer: Self-pay | Admitting: *Deleted

## 2021-07-05 ENCOUNTER — Encounter (HOSPITAL_BASED_OUTPATIENT_CLINIC_OR_DEPARTMENT_OTHER): Payer: Self-pay | Admitting: Cardiovascular Disease

## 2021-07-05 ENCOUNTER — Ambulatory Visit (INDEPENDENT_AMBULATORY_CARE_PROVIDER_SITE_OTHER): Payer: Medicare Other | Admitting: Cardiovascular Disease

## 2021-07-05 VITALS — BP 136/82 | HR 70 | Ht 66.0 in | Wt 254.8 lb

## 2021-07-05 DIAGNOSIS — E1159 Type 2 diabetes mellitus with other circulatory complications: Secondary | ICD-10-CM | POA: Insufficient documentation

## 2021-07-05 DIAGNOSIS — I1A0 Resistant hypertension: Secondary | ICD-10-CM

## 2021-07-05 DIAGNOSIS — Z006 Encounter for examination for normal comparison and control in clinical research program: Secondary | ICD-10-CM

## 2021-07-05 DIAGNOSIS — Z9989 Dependence on other enabling machines and devices: Secondary | ICD-10-CM

## 2021-07-05 DIAGNOSIS — Z6838 Body mass index (BMI) 38.0-38.9, adult: Secondary | ICD-10-CM

## 2021-07-05 DIAGNOSIS — I5032 Chronic diastolic (congestive) heart failure: Secondary | ICD-10-CM

## 2021-07-05 DIAGNOSIS — G4733 Obstructive sleep apnea (adult) (pediatric): Secondary | ICD-10-CM

## 2021-07-05 DIAGNOSIS — I10 Essential (primary) hypertension: Secondary | ICD-10-CM

## 2021-07-05 DIAGNOSIS — E785 Hyperlipidemia, unspecified: Secondary | ICD-10-CM

## 2021-07-05 HISTORY — DX: Essential (primary) hypertension: I10

## 2021-07-05 HISTORY — DX: Resistant hypertension: I1A.0

## 2021-07-05 MED ORDER — SPIRONOLACTONE 25 MG PO TABS: 25.0000 mg | ORAL_TABLET | Freq: Every day | ORAL | 3 refills | Status: DC

## 2021-07-05 NOTE — Research (Signed)
Cynthia [name] met criteria for the virtual care and social determinant interventions for the management of hypertension. The trial was explained to Cynthia [name] including the risk/benefits. She was given ample time to read the consent and ask questions. The consent was signed and a copy was given to her. No study related procedures were performed prior to signing the consent.  She was randomized to Group 2 advanced hypertension clinic with remote patient monitoring.
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
## 2021-07-05 NOTE — Patient Instructions (Addendum)
Medication Instructions:  STOP CHLORTHALIDONE   START SPIRONOLACTONE 25 MG DAILY AFTER YOU HAVE YOUR FIRST SET OF LABS   Labwork: HOLD YOUR LOSARTAN/HCT FOR 2 DAYS PRIOR TO YOUR RENIN/ALDOSTERONE/RENIN LEVEL   BMET 1 WEEK AFTER STARTING SPIRONOLACTONE    Testing/Procedures: Your physician has requested that you have a renal artery duplex. During this test, an ultrasound is used to evaluate blood flow to the kidneys. Allow one hour for this exam. Do not eat after midnight the day before and avoid carbonated beverages. Take your medications as you usually do. [facility] AT [address]  Follow-Up: 09/05/2021 9:30 AM PHARM D AT [facility] OFFICE  11/08/2021 8:15 AM DR [place] AT [facility] LOCATION    You will receive a phone call from the PREP exercise and nutrition program to schedule an initial assessment.  Special Instructions:   MONITOR BLOOD PRESSURE TWICE A DAY WITH MACHINE GIVEN    DASH Eating Plan DASH stands for "Dietary Approaches to Stop Hypertension." The DASH eating plan is a healthy eating plan that has been shown to reduce high blood pressure (hypertension). It may also reduce your risk for type 2 diabetes, heart disease, and stroke. The DASH eating plan may also help with weight loss. What are tips for following this plan?  General guidelines Avoid eating more than 2,300 mg (milligrams) of salt (sodium) a day. If you have hypertension, you may need to reduce your sodium intake to 1,500 mg a day. Limit alcohol intake to no more than 1 drink a day for nonpregnant women and 2 drinks a day for men. One drink equals 12 oz of beer, 5 oz of wine, or 1 oz of hard liquor. Work with your health care provider to maintain a healthy body weight or to lose weight. Ask what an ideal weight is for you. Get at least 30 minutes of exercise that causes your heart to beat faster (aerobic exercise) most days of the week. Activities may include walking, swimming, or  biking. Work with your health care provider or diet and nutrition specialist (dietitian) to adjust your eating plan to your individual calorie needs. Reading food labels  Check food labels for the amount of sodium per serving. Choose foods with less than 5 percent of the Daily Value of sodium. Generally, foods with less than 300 mg of sodium per serving fit into this eating plan. To find whole grains, look for the word "whole" as the first word in the ingredient list. Shopping Buy products labeled as "low-sodium" or "no salt added." Buy fresh foods. Avoid canned foods and premade or frozen meals. Cooking Avoid adding salt when cooking. Use salt-free seasonings or herbs instead of table salt or sea salt. Check with your health care provider or pharmacist before using salt substitutes. Do not fry foods. Cook foods using healthy methods such as baking, boiling, grilling, and broiling instead. Cook with heart-healthy oils, such as olive, canola, soybean, or sunflower oil. Meal planning Eat a balanced diet that includes: 5 or more servings of fruits and vegetables each day. At each meal, try to fill half of your plate with fruits and vegetables. Up to 6-8 servings of whole grains each day. Less than 6 oz of lean meat, poultry, or fish each day. A 3-oz serving of meat is about the same size as a deck of cards. One egg equals 1 oz. 2 servings of low-fat dairy each day. A serving of nuts, seeds, or beans 5 times each week. Heart-healthy fats.  Healthy fats called Omega-3 fatty acids are found in foods such as flaxseeds and coldwater fish, like sardines, salmon, and mackerel. Limit how much you eat of the following: Canned or prepackaged foods. Food that is high in trans fat, such as fried foods. Food that is high in saturated fat, such as fatty meat. Sweets, desserts, sugary drinks, and other foods with added sugar. Full-fat dairy products. Do not salt foods before eating. Try to eat at least 2  vegetarian meals each week. Eat more home-cooked food and less restaurant, buffet, and fast food. When eating at a restaurant, ask that your food be prepared with less salt or no salt, if possible. What foods are recommended? The items listed may not be a complete list. Talk with your dietitian about what dietary choices are best for you. Grains Whole-grain or whole-wheat bread. Whole-grain or whole-wheat pasta. Brown rice. Orpah Cobb. Bulgur. Whole-grain and low-sodium cereals. Pita bread. Low-fat, low-sodium crackers. Whole-wheat flour tortillas. Vegetables Fresh or frozen vegetables (raw, steamed, roasted, or grilled). Low-sodium or reduced-sodium tomato and vegetable juice. Low-sodium or reduced-sodium tomato sauce and tomato paste. Low-sodium or reduced-sodium canned vegetables. Fruits All fresh, dried, or frozen fruit. Canned fruit in natural juice (without added sugar). Meat and other protein foods Skinless chicken or [place]. Ground chicken or [place]. Pork with fat trimmed off. Fish and seafood. Egg whites. Dried beans, peas, or lentils. Unsalted nuts, nut butters, and seeds. Unsalted canned beans. Lean cuts of beef with fat trimmed off. Low-sodium, lean deli meat. Dairy Low-fat (1%) or fat-free (skim) milk. Fat-free, low-fat, or reduced-fat cheeses. Nonfat, low-sodium ricotta or cottage cheese. Low-fat or nonfat yogurt. Low-fat, low-sodium cheese. Fats and oils Soft margarine without trans fats. Vegetable oil. Low-fat, reduced-fat, or light mayonnaise and salad dressings (reduced-sodium). Canola, safflower, olive, soybean, and sunflower oils. Avocado. Seasoning and other foods Herbs. Spices. Seasoning mixes without salt. Unsalted popcorn and pretzels. Fat-free sweets. What foods are not recommended? The items listed may not be a complete list. Talk with your dietitian about what dietary choices are best for you. Grains Baked goods made with fat, such as croissants, muffins, or some  breads. Dry pasta or rice meal packs. Vegetables Creamed or fried vegetables. Vegetables in a cheese sauce. Regular canned vegetables (not low-sodium or reduced-sodium). Regular canned tomato sauce and paste (not low-sodium or reduced-sodium). Regular tomato and vegetable juice (not low-sodium or reduced-sodium). Rosita Fire. Olives. Fruits Canned fruit in a light or heavy syrup. Fried fruit. Fruit in cream or butter sauce. Meat and other protein foods Fatty cuts of meat. Ribs. Fried meat. Tomasa Blase. Sausage. Bologna and other processed lunch meats. Salami. Fatback. Hotdogs. Bratwurst. Salted nuts and seeds. Canned beans with added salt. Canned or smoked fish. Whole eggs or egg yolks. Chicken or [place] with skin. Dairy Whole or 2% milk, cream, and half-and-half. Whole or full-fat cream cheese. Whole-fat or sweetened yogurt. Full-fat cheese. Nondairy creamers. Whipped toppings. Processed cheese and cheese spreads. Fats and oils Butter. Stick margarine. Lard. Shortening. Ghee. Bacon fat. Tropical oils, such as coconut, palm kernel, or palm oil. Seasoning and other foods Salted popcorn and pretzels. Onion salt, garlic salt, seasoned salt, table salt, and sea salt. Worcestershire sauce. Tartar sauce. Barbecue sauce. Teriyaki sauce. Soy sauce, including reduced-sodium. Steak sauce. Canned and packaged gravies. Fish sauce. Oyster sauce. Cocktail sauce. Horseradish that you find on the shelf. Ketchup. Mustard. Meat flavorings and tenderizers. Bouillon cubes. Hot sauce and Tabasco sauce. Premade or packaged marinades. Premade or packaged taco seasonings. Relishes. Regular salad  dressings. Where to find more information: National Heart, Lung, and Blood Institute: PopSteam.is American Heart Association: www.heart.org Summary The DASH eating plan is a healthy eating plan that has been shown to reduce high blood pressure (hypertension). It may also reduce your risk for type 2 diabetes, heart disease, and  stroke. With the DASH eating plan, you should limit salt (sodium) intake to 2,300 mg a day. If you have hypertension, you may need to reduce your sodium intake to 1,500 mg a day. When on the DASH eating plan, aim to eat more fresh fruits and vegetables, whole grains, lean proteins, low-fat dairy, and heart-healthy fats. Work with your health care provider or diet and nutrition specialist (dietitian) to adjust your eating plan to your individual calorie needs. This information is not intended to replace advice given to you by your health care provider. Make sure you discuss any questions you have with your health care provider. Document Released: 10/19/2011 Document Revised: 10/12/2017 Document Reviewed: 10/23/2016 Elsevier Patient Education  2020 [employer].

## 2021-07-06 ENCOUNTER — Telehealth: Payer: Self-pay

## 2021-07-06 DIAGNOSIS — Z0000 Encounter for general adult medical examination without abnormal findings: Secondary | ICD-10-CM

## 2021-07-06 NOTE — Telephone Encounter (Signed)
Called patient for 24 hr f/u call for [employer]. Patient stated that some of the readings are not showing up for her on her phone and she didn't think we were getting them. After checking [employer], we have all her readings that she took. Patient was informed that when readings are missing, the Care Guide will reach out to check for connectivity issues. Patient verbalized understanding. Patient was made aware that health coaching is available to achieve various health goals. Patient was not able to continue conversation because she was in physical therapy. Patient was given contact information in case she has any questions.

## 2021-07-07 ENCOUNTER — Telehealth: Payer: Self-pay

## 2021-07-07 NOTE — Telephone Encounter (Signed)
Vmt pt requesting call back to discuss referral to PREP.  

## 2021-07-11 ENCOUNTER — Other Ambulatory Visit: Payer: Self-pay

## 2021-07-11 ENCOUNTER — Ambulatory Visit (HOSPITAL_COMMUNITY)
Admission: RE | Admit: 2021-07-11 | Discharge: 2021-07-11 | Disposition: A | Discharge status: Home / Self Care | Payer: Medicare Other | Source: Ambulatory Visit | Attending: Cardiology | Admitting: Cardiology

## 2021-07-11 DIAGNOSIS — I10 Essential (primary) hypertension: Secondary | ICD-10-CM

## 2021-07-13 ENCOUNTER — Telehealth: Payer: Self-pay | Admitting: Cardiovascular Disease

## 2021-07-13 ENCOUNTER — Other Ambulatory Visit: Payer: Self-pay | Admitting: Rehabilitation

## 2021-07-13 DIAGNOSIS — M5451 Vertebrogenic low back pain: Secondary | ICD-10-CM

## 2021-07-13 NOTE — Telephone Encounter (Signed)
Pt calling to clarify medication instructions. We discussed stopping chlorthalidone. Stopping losartan/hctz 2 days prior to renin/angiotensin and TSH lab work. She will stop on Sunday Sept 4 and have labs drawn Tues Sept 6. She will then begin back on her losartan/hctz and her spironolactone. She will return for lab work the following week on Sept 12 for BMP.  Appts were made for [facility] at her preference.   Pt has also requested her Pharm D HTN clinic appointment be made with [facility] office as well. I will forward to Pharm D to review and contact her.  She had no additional questions at this time.

## 2021-07-13 NOTE — Telephone Encounter (Signed)
Pt is needing clear instructions on what to do before and after lab work... states that she doesn't understand after visit summary... please advise

## 2021-07-19 ENCOUNTER — Other Ambulatory Visit: Payer: Medicare Other

## 2021-07-21 ENCOUNTER — Other Ambulatory Visit: Payer: Self-pay

## 2021-07-21 ENCOUNTER — Ambulatory Visit (HOSPITAL_COMMUNITY)
Admission: RE | Admit: 2021-07-21 | Discharge: 2021-07-21 | Disposition: A | Discharge status: Home / Self Care | Payer: Medicare Other | Source: Ambulatory Visit | Attending: Internal Medicine | Admitting: Internal Medicine

## 2021-07-21 DIAGNOSIS — I10 Essential (primary) hypertension: Secondary | ICD-10-CM

## 2021-07-24 LAB — ALDOSTERONE + RENIN ACTIVITY W/ RATIO
ALDOS/RENIN RATIO: 30.4 — ABNORMAL HIGH (ref 0.0–30.0)
ALDOSTERONE: 7.7 ng/dL (ref 0.0–30.0)
Renin: 0.253 ng/mL/hr (ref 0.167–5.380)

## 2021-07-24 LAB — TSH: TSH: 2.62 u[IU]/mL (ref 0.450–4.500)

## 2021-07-25 ENCOUNTER — Other Ambulatory Visit: Payer: Medicare Other

## 2021-07-28 LAB — BASIC METABOLIC PANEL
BUN/Creatinine Ratio: 17 (ref 12–28)
BUN: 23 mg/dL (ref 8–27)
CO2: 25 mmol/L (ref 20–29)
Calcium: 10 mg/dL (ref 8.7–10.3)
Chloride: 99 mmol/L (ref 96–106)
Creatinine, Ser: 1.34 mg/dL — ABNORMAL HIGH (ref 0.57–1.00)
Glucose: 147 mg/dL — ABNORMAL HIGH (ref 65–99)
Potassium: 4.6 mmol/L (ref 3.5–5.2)
Sodium: 140 mmol/L (ref 134–144)
eGFR: 44 mL/min/{1.73_m2} — ABNORMAL LOW (ref >59)

## 2021-07-29 ENCOUNTER — Other Ambulatory Visit: Payer: Self-pay | Admitting: Family Medicine

## 2021-07-29 NOTE — Telephone Encounter (Signed)
Please call patient to confirm if she will be continuing care at our office. She has an appointment scheduled on 08/02/2021 at [facility] as a new patient with Enid Skeens, NP.

## 2021-07-29 NOTE — Telephone Encounter (Signed)
Requested medication (s) are due for refill today - no  Requested medication (s) are on the active medication list -yes- change in dosing  Future visit scheduled -no  Last refill: 05/02/21  Notes to clinic: Request RF- may not be [facility] patient , medication dose not current  Requested Prescriptions  Pending Prescriptions Disp Refills   metFORMIN (GLUCOPHAGE-XR) 500 MG 24 hr tablet [Pharmacy Med Name: METFORMIN HCL ER 500 MG TABLET] 90 tablet 0    Sig: TAKE 1 [place]     Endocrinology:  Diabetes - Biguanides Failed - 07/29/2021  9:13 AM      Failed - Cr in normal range and within 360 days    Creatinine, Ser  Date Value Ref Range Status  07/28/2021 1.34 (H) 0.57 - 1.00 mg/dL Final   Creatinine,U  Date Value Ref Range Status  03/14/2017 136.8 mg/dL Final          Failed - AA eGFR in normal range and within 360 days    GFR calc Af Amer  Date Value Ref Range Status  11/23/2020 47 (L) >59 mL/min/1.73 Final    Comment:    **In accordance with recommendations from the NKF-ASN Task force,**   [facility] is in the process of updating its eGFR calculation to the   2021 CKD-EPI creatinine equation that estimates kidney function   without a race variable.    GFR, Estimated  Date Value Ref Range Status  05/18/2021 49 (L) >60 mL/min Final    Comment:    (NOTE) Calculated using the CKD-EPI Creatinine Equation (2021)    GFR  Date Value Ref Range Status  03/14/2017 51.15 (L) >60.00 mL/min Final   eGFR  Date Value Ref Range Status  07/28/2021 44 (L) >59 mL/min/1.73 Final          Passed - HBA1C is between 0 and 7.9 and within 180 days    Hemoglobin A1C  Date Value Ref Range Status  05/10/2021 7.2 (A) 4.0 - 5.6 % Final  02/11/2016 7.7  Final   Hgb A1c MFr Bld  Date Value Ref Range Status  11/23/2020 8.7 (H) 4.8 - 5.6 % Final    Comment:             Prediabetes: 5.7 - 6.4          Diabetes: >6.4          Glycemic control for adults with  diabetes: <7.0           Passed - Valid encounter within last 6 months    Recent Outpatient Visits           2 months ago Type 2 diabetes mellitus with hyperglycemia, with long-term current use of insulin ([place])   [place], Annie Main L, RPH-CPP   8 months ago Polyneuropathy associated with underlying disease ([place])   [facility], Laurita Quint, FNP   10 months ago Vaccine counseling   [facility] at [facility], Laurita Quint, FNP   1 year ago Type II diabetes mellitus with neurological manifestations [facility])   [facility] at [place], MD   1 year ago Left groin pain   [facility] at [facility], Lilia Argue, MD       Future Appointments             In 4 days Early, Coralee Pesa, NP [facility] [facility],  [facility]   In 1 month  [facility], [facility]   In 3 months Skeet Latch, MD [facility], [facility]               Requested Prescriptions  Pending Prescriptions Disp Refills   metFORMIN (GLUCOPHAGE-XR) 500 MG 24 hr tablet [Pharmacy Med Name: METFORMIN HCL ER 500 MG TABLET] 90 tablet 0    Sig: TAKE 1 TABLET BY [place]     Endocrinology:  Diabetes - Biguanides Failed - 07/29/2021  9:13 AM      Failed - Cr in normal range and within 360 days    Creatinine, Ser  Date Value Ref Range Status  07/28/2021 1.34 (H) 0.57 - 1.00 mg/dL Final   Creatinine,U  Date Value Ref Range Status  03/14/2017 136.8 mg/dL Final          Failed - AA eGFR in normal range and within 360 days    GFR calc Af Amer  Date Value Ref Range Status  11/23/2020 47 (L) >59 mL/min/1.73 Final    Comment:    **In accordance with recommendations from the NKF-ASN Task force,**   [facility] is in the process of updating its eGFR calculation to the   2021 CKD-EPI creatinine equation that estimates kidney function   without a race variable.     GFR, Estimated  Date Value Ref Range Status  05/18/2021 49 (L) >60 mL/min Final    Comment:    (NOTE) Calculated using the CKD-EPI Creatinine Equation (2021)    GFR  Date Value Ref Range Status  03/14/2017 51.15 (L) >60.00 mL/min Final   eGFR  Date Value Ref Range Status  07/28/2021 44 (L) >59 mL/min/1.73 Final          Passed - HBA1C is between 0 and 7.9 and within 180 days    Hemoglobin A1C  Date Value Ref Range Status  05/10/2021 7.2 (A) 4.0 - 5.6 % Final  02/11/2016 7.7  Final   Hgb A1c MFr Bld  Date Value Ref Range Status  11/23/2020 8.7 (H) 4.8 - 5.6 % Final    Comment:             Prediabetes: 5.7 - 6.4          Diabetes: >6.4          Glycemic control for adults with diabetes: <7.0           Passed - Valid encounter within last 6 months    Recent Outpatient Visits           2 months ago Type 2 diabetes mellitus with hyperglycemia, with long-term current use of insulin ([place])   [place], Annie Main L, RPH-CPP   8 months ago Polyneuropathy associated with underlying disease ([place])   [facility], Laurita Quint, FNP   10 months ago Vaccine counseling   [facility] at [facility], Laurita Quint, FNP   1 year ago Type II diabetes mellitus with neurological manifestations [facility])   Primary [facility] at [place], MD   1 year ago Left groin pain   [facility], Lilia Argue, MD       Future Appointments             In 4 days Early, Coralee Pesa, NP [facility] [facility], [facility]   In 1 month  [facility], [facility]  In 3 months Skeet Latch, MD [facility], [facility]

## 2021-07-30 ENCOUNTER — Ambulatory Visit
Admission: RE | Admit: 2021-07-30 | Discharge: 2021-07-30 | Disposition: A | Discharge status: Home / Self Care | Payer: 59 | Source: Ambulatory Visit | Attending: Rehabilitation | Admitting: Rehabilitation

## 2021-07-30 DIAGNOSIS — M5451 Vertebrogenic low back pain: Secondary | ICD-10-CM

## 2021-08-01 ENCOUNTER — Telehealth: Payer: Self-pay

## 2021-08-01 DIAGNOSIS — Z0000 Encounter for general adult medical examination without abnormal findings: Secondary | ICD-10-CM

## 2021-08-01 NOTE — Telephone Encounter (Signed)
Patient requested a phone call in [employer]. Called patient to discuss connectivity issues. Patient is having to manually enter her bp readings since Thursday. Patient's device is not connecting with bp cuff. Care Guide will place a ticket with IT for further troubleshooting.   Patient indicated in pathway question that she was not monitoring her salt intake. Offered health coaching for monitoring sodium. Patient stated that she will start doing so now that she has returned to eating meat. Patient had been consuming smoothies for the past four days. Health coach will send patient sodium tracker sheets via email and mail. Patient expressed verbal understanding and had no additional questions. Patient was given direct contact information for health coach.   Jennessa Trigo Nedra Hai, [facility] Guide, Health Coach [address], [address] [place] [address] Telephone: [phone] Email: [email or web address]

## 2021-08-02 ENCOUNTER — Other Ambulatory Visit: Payer: Self-pay

## 2021-08-02 ENCOUNTER — Ambulatory Visit (INDEPENDENT_AMBULATORY_CARE_PROVIDER_SITE_OTHER): Payer: 59 | Admitting: Nurse Practitioner

## 2021-08-02 ENCOUNTER — Encounter (HOSPITAL_BASED_OUTPATIENT_CLINIC_OR_DEPARTMENT_OTHER): Payer: Self-pay | Admitting: Nurse Practitioner

## 2021-08-02 VITALS — BP 126/70 | HR 76 | Resp 12 | Ht 66.0 in | Wt 244.0 lb

## 2021-08-02 DIAGNOSIS — S060X0A Concussion without loss of consciousness, initial encounter: Secondary | ICD-10-CM | POA: Insufficient documentation

## 2021-08-02 DIAGNOSIS — E1165 Type 2 diabetes mellitus with hyperglycemia: Secondary | ICD-10-CM

## 2021-08-02 DIAGNOSIS — Z9989 Dependence on other enabling machines and devices: Secondary | ICD-10-CM

## 2021-08-02 DIAGNOSIS — E785 Hyperlipidemia, unspecified: Secondary | ICD-10-CM

## 2021-08-02 DIAGNOSIS — I129 Hypertensive chronic kidney disease with stage 1 through stage 4 chronic kidney disease, or unspecified chronic kidney disease: Secondary | ICD-10-CM | POA: Diagnosis not present

## 2021-08-02 DIAGNOSIS — Z1211 Encounter for screening for malignant neoplasm of colon: Secondary | ICD-10-CM

## 2021-08-02 DIAGNOSIS — R519 Headache, unspecified: Secondary | ICD-10-CM | POA: Insufficient documentation

## 2021-08-02 DIAGNOSIS — E1149 Type 2 diabetes mellitus with other diabetic neurological complication: Secondary | ICD-10-CM

## 2021-08-02 DIAGNOSIS — S060X0D Concussion without loss of consciousness, subsequent encounter: Secondary | ICD-10-CM

## 2021-08-02 DIAGNOSIS — Z794 Long term (current) use of insulin: Secondary | ICD-10-CM | POA: Insufficient documentation

## 2021-08-02 DIAGNOSIS — N183 Chronic kidney disease, stage 3 unspecified: Secondary | ICD-10-CM

## 2021-08-02 DIAGNOSIS — G4733 Obstructive sleep apnea (adult) (pediatric): Secondary | ICD-10-CM

## 2021-08-02 DIAGNOSIS — Z7689 Persons encountering health services in other specified circumstances: Secondary | ICD-10-CM | POA: Insufficient documentation

## 2021-08-02 DIAGNOSIS — G4700 Insomnia, unspecified: Secondary | ICD-10-CM | POA: Insufficient documentation

## 2021-08-02 DIAGNOSIS — M797 Fibromyalgia: Secondary | ICD-10-CM

## 2021-08-02 DIAGNOSIS — I10 Essential (primary) hypertension: Secondary | ICD-10-CM

## 2021-08-02 DIAGNOSIS — I739 Peripheral vascular disease, unspecified: Secondary | ICD-10-CM

## 2021-08-02 DIAGNOSIS — I5032 Chronic diastolic (congestive) heart failure: Secondary | ICD-10-CM

## 2021-08-02 MED ORDER — NORTRIPTYLINE HCL 25 MG PO CAPS: ORAL_CAPSULE | ORAL | 5 refills | Status: DC

## 2021-08-02 MED ORDER — HYDROXYZINE PAMOATE 25 MG PO CAPS: 25.0000 mg | ORAL_CAPSULE | Freq: Every evening | ORAL | 5 refills | Status: DC | PRN

## 2021-08-02 NOTE — Assessment & Plan Note (Signed)
HTN well controlled with HTN Clinic  Medication management recently seems to have improved findings.  Will review chart for medications and lab function.  No changes to medications today. Referral placed to nephrology for input on situation and management.

## 2021-08-02 NOTE — Patient Instructions (Signed)
Recommendations from today's visit: We will start Hydroxyzine at bedtime to see if this will help with your sleeping.  Take one tablet about 30 minutes prior to bedtime.  You can take the temazepam on the nights that this is not helpful I am sending referrals for you today, If you have any changes between now and your referrals please contact me.  I would like you to come back in about 4 weeks so we can reassess and see how you are doing.   Information on diet, exercise, and health maintenance recommendations are listed below. This is information to help you be sure you are on track for optimal health and monitoring.   Please look over this and let us know if you have any questions or if you have completed any of the health maintenance outside of [place] so that we can be sure your records are up to date.  ___________________________________________________________  Thank you for choosing [place] [facility] for your Primary Care needs. I am excited for the opportunity to partner with you to meet your health care goals. It was a pleasure meeting you today!  I am an Adult-Geriatric Nurse Practitioner with a background in caring for patients for more than 20 years. I provide primary care and sports medicine services to patients age 35 and older within this office. I am also the director of the APP Fellowship with [facility].   I am passionate about providing the best service to you through preventive medicine and supportive care. I consider you a part of the medical team and value your input. I work diligently to ensure that you are heard and your needs are met in a safe and effective manner. I want you to feel comfortable with me as your provider and want you to know that your health concerns are important to me.  For your information, our office hours are Monday- Friday 8:00 AM - 5:00 PM At this time I am not in the office on Wednesdays.  If you have questions or  concerns, please call our office at [phone] or send [place] a MyChart message and we will respond as quickly as possible.   For all urgent or time sensitive needs we ask that you please call the office to avoid delays. MyChart is not constantly monitored and replies may take up to 72 business hours.  MyChart Policy: MyChart allows for you to see your visit notes, after visit summary, provider recommendations, lab and tests results, make an appointment, request refills, and contact your provider or the office for non-urgent questions or concerns. Providers are seeing patients during normal business hours and do not have built in time to review MyChart messages.  We ask that you allow a minimum of 4 business days for responses to [employer]. For this reason, please do not send urgent requests through [place]. Please call the office at [phone]. Complex MyChart concerns may require a visit. Your provider may request you schedule a virtual or in person visit to ensure we are providing the best care possible. MyChart messages sent after 4:00 PM on Friday will not be received by the provider until Monday morning.    Lab and Test Results: You will receive your lab and test results on MyChart as soon as they are completed and results have been sent by the lab or testing facility. Due to this service, you will receive your results BEFORE your provider.  I review lab and tests results each morning  prior to seeing patients. Some results require collaboration with other providers to ensure you are receiving the most appropriate care. For this reason, we ask that you please allow a minimum of 4 business days for your provider to receive and review lab and test results and contact you about these.  Most lab and test result comments from the provider will be sent through [place]. Your provider may recommend changes to the plan of care, follow-up visits, repeat testing, ask questions, or request an office  visit to discuss these results. You may reply directly to this message or call the office at [phone] to provide information for the provider or set up an appointment. In some instances, you will be called with test results and recommendations. Please let us know if this is preferred and we will make note of this in your chart to provide this for you.    If you have not heard a response to your lab or test results in 72 business hours, please call the office to let us know.   After Hours: For all non-emergency after hours needs, please call the office at [phone] and select the option to reach the on-call provider service. On-call services are shared between multiple [place] offices and therefore it will not be possible to speak directly with your provider. On-call providers may provide medical advice and recommendations, but are unable to provide refills for maintenance medications.  For all emergency or urgent medical needs after normal business hours, we recommend that you seek care at the closest Urgent Care or Emergency Department to ensure appropriate treatment in a timely manner.  [facility] has a 24 hour emergency room located on the ground floor for your convenience.    Please do not hesitate to reach out to [place] with concerns.   Thank you, again, for choosing me as your health care partner. I appreciate your trust and look forward to learning more about you.   Worthy Keeler, DNP, AGNP-c ___________________________________________________________  Health Maintenance Recommendations Screening Testing Mammogram Every 1 -2 years based on history and risk factors Starting at age 77 Pap Smear Ages 21-39 every 3 years Ages 78-65 every 5 years with HPV testing More frequent testing may be required based on results and history Colon Cancer Screening Every 1-10 years based on test performed, risk factors, and history Starting at age 42 Bone Density  Screening Every 2-10 years based on history Starting at age 55 for women Recommendations for men differ based on medication usage, history, and risk factors AAA Screening One time ultrasound Men 42-81 years old who have every smoked Lung Cancer Screening Low Dose Lung CT every 12 months Age 59-80 years with a 30 pack-year smoking history who still smoke or who have quit within the last 15 years  Screening Labs Routine  Labs: Complete Blood Count (CBC), Complete Metabolic Panel (CMP), Cholesterol (Lipid Panel) Every 6-12 months based on history and medications May be recommended more frequently based on current conditions or previous results Hemoglobin A1c Lab Every 3-12 months based on history and previous results Starting at age 28 or earlier with diagnosis of diabetes, high cholesterol, BMI >26, and/or risk factors Frequent monitoring for patients with diabetes to ensure blood sugar control Thyroid Panel (TSH w/ T3 & T4) Every 6 months based on history, symptoms, and risk factors May be repeated more often if on medication HIV One time testing for all patients 64 and older May be repeated more frequently for patients with  increased risk factors or exposure Hepatitis C One time testing for all patients 18 and older May be repeated more frequently for patients with increased risk factors or exposure Gonorrhea, Chlamydia Every 12 months for all sexually active persons 13-24 years Additional monitoring may be recommended for those who are considered high risk or who have symptoms PSA Men 3-74 years old with risk factors Additional screening may be recommended from age 49-69 based on risk factors, symptoms, and history  Vaccine Recommendations Tetanus Booster All adults every 10 years Flu Vaccine All patients 6 months and older every year COVID Vaccine All patients 12 years and older Initial dosing with booster May recommend additional booster based on age and health  history HPV Vaccine 2 doses all patients age 49-26 Dosing may be considered for patients over 26 Shingles Vaccine (Shingrix) 2 doses all adults 56 years and older Pneumonia (Pneumovax 23) All adults 29 years and older May recommend earlier dosing based on health history Pneumonia (Prevnar 62) All adults 36 years and older Dosed 1 year after Pneumovax 23  Additional Screening, Testing, and Vaccinations may be recommended on an individualized basis based on family history, health history, risk factors, and/or exposure.  __________________________________________________________  Diet Recommendations for All Patients  I recommend that all patients maintain a diet low in saturated fats, carbohydrates, and cholesterol. While this can be challenging at first, it is not impossible and small changes can make big differences.  Things to try: Decreasing the amount of soda, sweet tea, and/or juice to one or less per day and replace with water While water is always the first choice, if you do not like water you may consider adding a water additive without sugar to improve the taste other sugar free drinks Replace potatoes with a brightly colored vegetable at dinner Use healthy oils, such as canola oil or olive oil, instead of butter or hard margarine Limit your bread intake to two pieces or less a day Replace regular pasta with low carb pasta options Bake, broil, or grill foods instead of frying Monitor portion sizes  Eat smaller, more frequent meals throughout the day instead of large meals  An important thing to remember is, if you love foods that are not great for your health, you don't have to give them up completely. Instead, allow these foods to be a reward when you have done well. Allowing yourself to still have special treats every once in a while is a nice way to tell yourself thank you for working hard to keep yourself healthy.   Also remember that every day is a new day. If you have a  bad day and "fall off the wagon", you can still climb right back up and keep moving along on your journey!  We have resources available to help you!  Some websites that may be helpful include: www.http://carter.biz/  Www.VeryWellFit.com _____________________________________________________________  Activity Recommendations for All Patients  I recommend that all adults get at least 20 minutes of moderate physical activity that elevates your heart rate at least 5 days out of the week.  Some examples include: Walking or jogging at a pace that allows you to carry on a conversation Cycling (stationary bike or outdoors) Water aerobics Yoga Weight lifting Dancing If physical limitations prevent you from putting stress on your joints, exercise in a pool or seated in a chair are excellent options.  Do determine your MAXIMUM heart rate for activity: YOUR AGE - 220 = MAX HeartRate   Remember! Do not push  yourself too hard.  Start slowly and build up your pace, speed, weight, time in exercise, etc.  Allow your body to rest between exercise and get good sleep. You will need more water than normal when you are exerting yourself. Do not wait until you are thirsty to drink. Drink with a purpose of getting in at least 8, 8 ounce glasses of water a day plus more depending on how much you exercise and sweat.    If you begin to develop dizziness, chest pain, abdominal pain, jaw pain, shortness of breath, headache, vision changes, lightheadedness, or other concerning symptoms, stop the activity and allow your body to rest. If your symptoms are severe, seek emergency evaluation immediately. If your symptoms are concerning, but not severe, please let us know so that we can recommend further evaluation.   ________________________________________________________________
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
## 2021-08-02 NOTE — Assessment & Plan Note (Signed)
History of neuropathy. Previously followed by neurology with Dr. Elana Alm.  Referral placed today for re-evaluation.  Unclear if neuropathy is contributing to recent falls at this time.  No alarm symptoms present.  Neurological evaluation intact.

## 2021-08-02 NOTE — Assessment & Plan Note (Signed)
Recent kidney function appears stable.  Concerns over management given significant co-morbidities including DM, HTN, fibromyalgia, and CHF. No alarm symptoms present today.  Recently taken off of chlorthalidone and started on spironolactone by HTN clinic.  LE edema is improved with use of daily compression stockings.  She reports decreased urine output, however, chlorthalidone was stopped, which may be contributing to this.  Will send referral to nephrology for evaluation and recommendations given complicated status and nature of patients conditions.

## 2021-08-02 NOTE — Assessment & Plan Note (Signed)
Currently managed with atorvastatin. No concerning findings today. Recommend tight control of blood sugar and consistent diet and exercise to avoid progression and future complications in the setting of multiple conditions with significant impact on CV health.

## 2021-08-02 NOTE — Assessment & Plan Note (Addendum)
>>  ASSESSMENT AND PLAN FOR TYPE 2 DIABETES MELLITUS WITH STAGE 3 CHRONIC KIDNEY DISEASE, WITH LONG-TERM CURRENT USE OF INSULIN (HCC) WRITTEN ON 08/02/2021  6:27 PM BY Marvis Saefong E, NP  Reportedly not well controlled.  Will review chart for recent labs and medications.  It appears that she is on kidney protective doses of farxiga.  Reported history of pancreatitis makes Ozempic a non optimal choice, although, I do feel that she would greatly benefit from diabetes and cardiovascular standpoint.  At this time recommend that patient not start the medication. Endocrinology referral placed for further evaluation and recommendations for diabetes control in the setting of HF and CKD.   >>ASSESSMENT AND PLAN FOR TYPE II DIABETES MELLITUS WITH NEUROLOGICAL MANIFESTATIONS (HCC) WRITTEN ON 08/02/2021  6:28 PM BY Serenitie Vinton E, NP  History of neuropathy. Previously followed by neurology with Dr. Elana Alm.  Referral placed today for re-evaluation.  Unclear if neuropathy is contributing to recent falls at this time.  No alarm symptoms present.  Neurological evaluation intact.
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
## 2021-08-02 NOTE — Assessment & Plan Note (Signed)
No longer on CPAP due to weight loss.  Will continue to monitor Frequent awakenings could be from apneic episodes.  Will send referral to neurology for further evaluation given sleep concerns as well as weight gain that may contribute to episodes.

## 2021-08-02 NOTE — Assessment & Plan Note (Signed)
Reports this is stable today with no concerns.  Will continue to monitor.

## 2021-08-02 NOTE — Assessment & Plan Note (Signed)
Currently no symptoms present.  BP is currently controlled with multiple medications.  Patient is followed by HTN clinic  Encourage f/u if symptoms change or new symptoms present.

## 2021-08-02 NOTE — Assessment & Plan Note (Signed)
Review of current and past medical history, social history, medication, and family history.  Review of care gaps and health maintenance recommendations.  Records from recent providers to be requested if not available in Chart Review or Care Everywhere.  Recommendations for health maintenance, diet, and exercise provided.  Patient has complicated course of health over the past few months adding to underlying health conditions.  Recommendations for referral discussed with patient.  Will bring patient back in a few weeks after full chart review has been completed to make recommendations to changes to her plan of care.
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
## 2021-08-02 NOTE — Assessment & Plan Note (Signed)
PVD with LE edema controlled with compression stockings.  No significant edema present today. No signs of skin breakdown or concerns present Recommend continued use of compression stocking during the day and propping feed up when seated to prevent edema.  Monitor for skin breakdown or sores present.

## 2021-08-02 NOTE — Assessment & Plan Note (Signed)
Controlled today at 126/70 with no concerning features.  Reports continued headaches on daily basis, but these appear to be related to concussion rather than BP Recommend continued following with HTN clinic and daily medication adherence.  Will follow.

## 2021-08-04 DIAGNOSIS — I10 Essential (primary) hypertension: Secondary | ICD-10-CM

## 2021-08-11 ENCOUNTER — Telehealth (HOSPITAL_BASED_OUTPATIENT_CLINIC_OR_DEPARTMENT_OTHER): Payer: Self-pay

## 2021-08-11 ENCOUNTER — Telehealth (HOSPITAL_BASED_OUTPATIENT_CLINIC_OR_DEPARTMENT_OTHER): Payer: Self-pay | Admitting: Nurse Practitioner

## 2021-08-11 DIAGNOSIS — G4700 Insomnia, unspecified: Secondary | ICD-10-CM

## 2021-08-11 DIAGNOSIS — S060X0D Concussion without loss of consciousness, subsequent encounter: Secondary | ICD-10-CM

## 2021-08-11 DIAGNOSIS — R519 Headache, unspecified: Secondary | ICD-10-CM

## 2021-08-11 NOTE — Telephone Encounter (Signed)
Referral came back from [facility]. Stated she should be seen for [facility]. Have referral waiting in que to be sent just needing provider approval. Please advise.

## 2021-08-11 NOTE — Addendum Note (Signed)
Addended by: Heberto Sturdevant, Huntley Dec E on: 08/11/2021 05:38 PM   Modules accepted: Orders

## 2021-08-11 NOTE — Telephone Encounter (Signed)
Patint called inquiring about a neurology referral.  [facility] is not seeing patients in the concussion cliinic.  Patient will be referred to [facility] at [facility].

## 2021-08-15 ENCOUNTER — Other Ambulatory Visit: Payer: Self-pay | Admitting: Family Medicine

## 2021-08-15 NOTE — Telephone Encounter (Signed)
Requested Prescriptions  Refused Prescriptions Disp Refills  . metFORMIN (GLUCOPHAGE-XR) 500 MG 24 hr tablet [Pharmacy Med Name: METFORMIN HCL ER 500 MG TABLET] 90 tablet 0    Sig: TAKE 1 TABLET BY MOUTH EVERY DAY WITH BREAKFAST     Endocrinology:  Diabetes - Biguanides Failed - 08/15/2021  9:13 AM      Failed - Cr in normal range and within 360 days    Creatinine, Ser  Date Value Ref Range Status  07/28/2021 1.34 (H) 0.57 - 1.00 mg/dL Final   Creatinine,U  Date Value Ref Range Status  03/14/2017 136.8 mg/dL Final         Failed - AA eGFR in normal range and within 360 days    GFR calc Af Amer  Date Value Ref Range Status  11/23/2020 47 (L) >59 mL/min/1.73 Final    Comment:    **In accordance with recommendations from the NKF-ASN Task force,**   [facility] is in the process of updating its eGFR calculation to the   2021 CKD-EPI creatinine equation that estimates kidney function   without a race variable.    GFR, Estimated  Date Value Ref Range Status  05/18/2021 49 (L) >60 mL/min Final    Comment:    (NOTE) Calculated using the CKD-EPI Creatinine Equation (2021)    GFR  Date Value Ref Range Status  03/14/2017 51.15 (L) >60.00 mL/min Final   eGFR  Date Value Ref Range Status  07/28/2021 44 (L) >59 mL/min/1.73 Final         Passed - HBA1C is between 0 and 7.9 and within 180 days    Hemoglobin A1C  Date Value Ref Range Status  05/10/2021 7.2 (A) 4.0 - 5.6 % Final  02/11/2016 7.7  Final   Hgb A1c MFr Bld  Date Value Ref Range Status  11/23/2020 8.7 (H) 4.8 - 5.6 % Final    Comment:             Prediabetes: 5.7 - 6.4          Diabetes: >6.4          Glycemic control for adults with diabetes: <7.0          Passed - Valid encounter within last 6 months    Recent Outpatient Visits          3 months ago Type 2 diabetes mellitus with hyperglycemia, with long-term current use of insulin ([place])   Udall, Annie Main L, RPH-CPP    8 months ago Polyneuropathy associated with underlying disease ([place])   [facility], Laurita Quint, FNP   11 months ago Vaccine counseling   Primary Care at [facility], Laurita Quint, FNP   1 year ago Type II diabetes mellitus with neurological manifestations [facility])   Primary [facility] at [place], MD   1 year ago Left groin pain   Primary Care at [facility], Lilia Argue, MD      Future Appointments            In 2 weeks Early, Coralee Pesa, NP [facility], [facility]   In 3 weeks  [facility], [facility]   In 2 months Skeet Latch, MD [facility], [facility]

## 2021-08-16 ENCOUNTER — Telehealth (HOSPITAL_BASED_OUTPATIENT_CLINIC_OR_DEPARTMENT_OTHER): Payer: Self-pay

## 2021-08-16 ENCOUNTER — Other Ambulatory Visit (HOSPITAL_BASED_OUTPATIENT_CLINIC_OR_DEPARTMENT_OTHER): Payer: Self-pay | Admitting: Nurse Practitioner

## 2021-08-16 DIAGNOSIS — R519 Headache, unspecified: Secondary | ICD-10-CM

## 2021-08-16 DIAGNOSIS — M797 Fibromyalgia: Secondary | ICD-10-CM

## 2021-08-16 DIAGNOSIS — S060X0D Concussion without loss of consciousness, subsequent encounter: Secondary | ICD-10-CM

## 2021-08-16 MED ORDER — TIZANIDINE HCL 4 MG PO TABS: 4.0000 mg | ORAL_TABLET | Freq: Four times a day (QID) | ORAL | 3 refills | Status: DC | PRN

## 2021-08-16 NOTE — Telephone Encounter (Signed)
Patient called inquiring about a referral to neurology.  She stated [facility] could not get her in until the end of November.  A referral to [facility] was placed.  Patient would also like a refill on Zanaflex.

## 2021-08-18 ENCOUNTER — Telehealth (HOSPITAL_BASED_OUTPATIENT_CLINIC_OR_DEPARTMENT_OTHER): Payer: Self-pay

## 2021-08-18 ENCOUNTER — Ambulatory Visit: Payer: 59 | Admitting: Family Medicine

## 2021-08-18 NOTE — Telephone Encounter (Signed)
Called patient to inform her that her prescription had been sent in.

## 2021-08-30 ENCOUNTER — Other Ambulatory Visit (HOSPITAL_BASED_OUTPATIENT_CLINIC_OR_DEPARTMENT_OTHER): Payer: Self-pay

## 2021-08-30 ENCOUNTER — Ambulatory Visit (HOSPITAL_BASED_OUTPATIENT_CLINIC_OR_DEPARTMENT_OTHER): Payer: 59 | Admitting: Nurse Practitioner

## 2021-08-30 DIAGNOSIS — E1165 Type 2 diabetes mellitus with hyperglycemia: Secondary | ICD-10-CM

## 2021-08-30 DIAGNOSIS — Z794 Long term (current) use of insulin: Secondary | ICD-10-CM

## 2021-08-30 MED ORDER — DAPAGLIFLOZIN PROPANEDIOL 10 MG PO TABS: 10.0000 mg | ORAL_TABLET | Freq: Every day | ORAL | 0 refills | Status: DC

## 2021-09-02 ENCOUNTER — Other Ambulatory Visit: Payer: Self-pay | Admitting: Family Medicine

## 2021-09-03 DIAGNOSIS — I10 Essential (primary) hypertension: Secondary | ICD-10-CM

## 2021-09-05 ENCOUNTER — Ambulatory Visit: Payer: Medicare Other

## 2021-09-06 ENCOUNTER — Encounter (HOSPITAL_BASED_OUTPATIENT_CLINIC_OR_DEPARTMENT_OTHER): Payer: Self-pay | Admitting: Nurse Practitioner

## 2021-09-06 ENCOUNTER — Other Ambulatory Visit: Payer: Self-pay

## 2021-09-06 ENCOUNTER — Ambulatory Visit (INDEPENDENT_AMBULATORY_CARE_PROVIDER_SITE_OTHER): Payer: 59 | Admitting: Nurse Practitioner

## 2021-09-06 ENCOUNTER — Other Ambulatory Visit (HOSPITAL_BASED_OUTPATIENT_CLINIC_OR_DEPARTMENT_OTHER): Payer: Self-pay | Admitting: Nurse Practitioner

## 2021-09-06 ENCOUNTER — Other Ambulatory Visit (HOSPITAL_BASED_OUTPATIENT_CLINIC_OR_DEPARTMENT_OTHER): Payer: Self-pay

## 2021-09-06 VITALS — BP 120/84 | HR 65 | Ht 66.0 in | Wt 241.0 lb

## 2021-09-06 DIAGNOSIS — E1149 Type 2 diabetes mellitus with other diabetic neurological complication: Secondary | ICD-10-CM

## 2021-09-06 DIAGNOSIS — Z794 Long term (current) use of insulin: Secondary | ICD-10-CM

## 2021-09-06 DIAGNOSIS — I10 Essential (primary) hypertension: Secondary | ICD-10-CM

## 2021-09-06 DIAGNOSIS — G63 Polyneuropathy in diseases classified elsewhere: Secondary | ICD-10-CM

## 2021-09-06 DIAGNOSIS — E1165 Type 2 diabetes mellitus with hyperglycemia: Secondary | ICD-10-CM | POA: Diagnosis not present

## 2021-09-06 DIAGNOSIS — R519 Headache, unspecified: Secondary | ICD-10-CM

## 2021-09-06 LAB — CBC WITH DIFFERENTIAL/PLATELET
Basophils Absolute: 0.1 10*3/uL (ref 0.0–0.2)
Basos: 1 %
EOS (ABSOLUTE): 0.2 10*3/uL (ref 0.0–0.4)
Eos: 4 %
Hematocrit: 39.3 % (ref 34.0–46.6)
Hemoglobin: 12.6 g/dL (ref 11.1–15.9)
Immature Grans (Abs): 0 10*3/uL (ref 0.0–0.1)
Immature Granulocytes: 0 %
Lymphocytes Absolute: 2.5 10*3/uL (ref 0.7–3.1)
Lymphs: 45 %
MCH: 27.8 pg (ref 26.6–33.0)
MCHC: 32.1 g/dL (ref 31.5–35.7)
MCV: 87 fL (ref 79–97)
Monocytes Absolute: 0.5 10*3/uL (ref 0.1–0.9)
Monocytes: 9 %
Neutrophils Absolute: 2.2 10*3/uL (ref 1.4–7.0)
Neutrophils: 41 %
Platelets: 375 10*3/uL (ref 150–450)
RBC: 4.53 x10E6/uL (ref 3.77–5.28)
RDW: 13.4 % (ref 11.7–15.4)
WBC: 5.4 10*3/uL (ref 3.4–10.8)

## 2021-09-06 LAB — POCT UA - MICROALBUMIN
Albumin/Creatinine Ratio, Urine, POC: <30
Creatinine, POC: 200 mg/dL
Microalbumin Ur, POC: 80 mg/L

## 2021-09-06 LAB — HEMOGLOBIN A1C
Est. average glucose Bld gHb Est-mCnc: 163 mg/dL
Hgb A1c MFr Bld: 7.3 % — ABNORMAL HIGH (ref 4.8–5.6)

## 2021-09-06 MED ORDER — NOVOLOG FLEXPEN 100 UNIT/ML ~~LOC~~ SOPN: 10.0000 [IU] | PEN_INJECTOR | SUBCUTANEOUS | 3 refills | Status: DC

## 2021-09-06 NOTE — Patient Instructions (Signed)
You are doing amazing!!!! I am so proud of you.   Take one 81mg  aspirin the day before you travel and the day of travel to help with blood clot protection.   We will see you again in 3 months.

## 2021-09-06 NOTE — Assessment & Plan Note (Signed)
Seen by HTN clinic for blood pressure. Blood pressure looks great today and is within the normal range. Suggested to continue to monitor your blood pressure at home.
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
## 2021-09-06 NOTE — Assessment & Plan Note (Signed)
Seen by Neurologist at [facility]. Still having headaches. Had a CT scan done yesterday and she will have the report faxed over. She has her follow up scheduled for Friday 09/09/21.

## 2021-09-06 NOTE — Assessment & Plan Note (Addendum)
>>  ASSESSMENT AND PLAN FOR TYPE 2 DIABETES MELLITUS WITH STAGE 3 CHRONIC KIDNEY DISEASE, WITH LONG-TERM CURRENT USE OF INSULIN (HCC) WRITTEN ON 09/06/2021  2:50 PM BY Modesto Charon, RN  Managing it with diet and sliding scale Novolog. Pending referral to endocrinology. Foot exam done today. Labs including A1c and urine microalbuminuria ordered. Educated the patient about hypoglycemic events and importance of monitoring the blood sugars, while she is doing to the no-carb diet.   >>ASSESSMENT AND PLAN FOR TYPE II DIABETES MELLITUS WITH NEUROLOGICAL MANIFESTATIONS (HCC) WRITTEN ON 09/06/2021  2:46 PM BY Modesto Charon, RN  Seen by Neurologist at [facility]. Still having headaches. Had a CT scan done yesterday and she will have the report faxed over. She has her follow up scheduled for Friday 09/09/21.
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
## 2021-09-07 ENCOUNTER — Other Ambulatory Visit: Payer: Self-pay | Admitting: Family Medicine

## 2021-09-09 ENCOUNTER — Encounter (HOSPITAL_BASED_OUTPATIENT_CLINIC_OR_DEPARTMENT_OTHER): Payer: Medicare Other | Admitting: Pharmacist Clinician (PhC)/ Clinical Pharmacy Specialist
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
## 2021-09-21 ENCOUNTER — Telehealth (HOSPITAL_BASED_OUTPATIENT_CLINIC_OR_DEPARTMENT_OTHER): Payer: Self-pay

## 2021-09-21 NOTE — Telephone Encounter (Signed)
Patient called in to inquire why she would have coughed up blood during her colonoscopy procedure She states she had the procedure done on Monday at [facility] on [address] and was told that once during the procedure she coughed up blood Patient denies any pain  Patient denies any more episodes of coughing up blood Patient states she was told by [facility] to follow up with her PCP I informed patient I would forward information to [name] DNP Patient is agreeable

## 2021-09-22 NOTE — Telephone Encounter (Signed)
Patient is aware of the below and states that she only had a colonoscopy. She states she has not experienced any further bleeding from her mouth. She also denies any further symptoms. Patient is agreeable to scheduling an appointment should she have this concern in future. AS, CMA

## 2021-09-24 ENCOUNTER — Other Ambulatory Visit: Payer: Self-pay | Admitting: Family Medicine

## 2021-09-24 NOTE — Telephone Encounter (Signed)
Pt called, asked if she has new PCP d/t PCP listed isn't with [facility]. Pt says that she sees the PCP listed and was unsure who Dr. Margarita Rana was and hasn't seen this provider before. Advised if she needed refill on metformin 575m she will need to notify her PCP. Will refuse this request.  Requested Prescriptions  Pending Prescriptions Disp Refills   metFORMIN (GLUCOPHAGE-XR) 500 MG 24 hr tablet [Pharmacy Med Name: METFORMIN HCL ER 500 MG TABLET] 90 tablet 0    Sig: TAKE 1 TABLET BY MMurphy    Endocrinology:  Diabetes - Biguanides Failed - 09/24/2021  9:39 AM      Failed - AA eGFR in normal range and within 360 days    GFR calc Af Amer  Date Value Ref Range Status  11/23/2020 47 (L) >59 mL/min/1.73 Final    Comment:    **In accordance with recommendations from the NKF-ASN Task force,**   [facility] is in the process of updating its eGFR calculation to the   2021 CKD-EPI creatinine equation that estimates kidney function   without a race variable.    GFR, Estimated  Date Value Ref Range Status  05/18/2021 49 (L) >60 mL/min Final    Comment:    (NOTE) Calculated using the CKD-EPI Creatinine Equation (2021)    GFR  Date Value Ref Range Status  03/14/2017 51.15 (L) >60.00 mL/min Final   eGFR  Date Value Ref Range Status  07/28/2021 44 (L) >59 mL/min/1.73 Final          Passed - Cr in normal range and within 360 days    Creatinine, Ser  Date Value Ref Range Status  07/28/2021 1.34 (H) 0.57 - 1.00 mg/dL Final   Creatinine, POC  Date Value Ref Range Status  09/06/2021 200 mg/dL Final          Passed - HBA1C is between 0 and 7.9 and within 180 days    Hemoglobin A1C  Date Value Ref Range Status  02/11/2016 7.7  Final   Hgb A1c MFr Bld  Date Value Ref Range Status  09/06/2021 7.3 (H) 4.8 - 5.6 % Final    Comment:             Prediabetes: 5.7 - 6.4          Diabetes: >6.4          Glycemic control for adults with diabetes: <7.0           Passed -  Valid encounter within last 6 months    Recent Outpatient Visits           4 months ago Type 2 diabetes mellitus with hyperglycemia, with long-term current use of insulin ([place] [facility]nie Main[facility], RPH-CPP   10 months ago Polyneuropathy associated with underlying disease ([place]   [facility] F[facility]   1 year ago Vaccine counseling   [facility] F[facility]   1 year ago Type II diabetes mellitus with neurological manifestations ([facility]   [facility]   1 year ago Left groin pain   [facility]       Future Appointments             In 1 month RSkeet Latch MD [facility]   In 2 months Early, SClarise Cruz  E, NP [facility], [facility]

## 2021-09-26 ENCOUNTER — Telehealth: Payer: Self-pay

## 2021-09-26 DIAGNOSIS — Z0000 Encounter for general adult medical examination without abnormal findings: Secondary | ICD-10-CM

## 2021-09-26 NOTE — Telephone Encounter (Signed)
Called patient to check to see how she was feeling because she had a low bp reading of 83/54 on 09/25/21. Patient did not answer phone. Left message for patient to return call to discuss this matter further.    Ginamarie Banfield Nedra Hai, [facility] [place] Guide, Health Coach [address], [address] [place] [address] Telephone: [phone] Email: [email or web address]

## 2021-09-30 ENCOUNTER — Institutional Professional Consult (permissible substitution): Payer: Medicare Other | Admitting: Neurology

## 2021-10-03 ENCOUNTER — Other Ambulatory Visit (HOSPITAL_BASED_OUTPATIENT_CLINIC_OR_DEPARTMENT_OTHER): Payer: Self-pay

## 2021-10-03 DIAGNOSIS — G4700 Insomnia, unspecified: Secondary | ICD-10-CM

## 2021-10-03 DIAGNOSIS — I10 Essential (primary) hypertension: Secondary | ICD-10-CM

## 2021-10-03 MED ORDER — HYDROXYZINE PAMOATE 25 MG PO CAPS: 25.0000 mg | ORAL_CAPSULE | Freq: Every evening | ORAL | 0 refills | Status: DC | PRN

## 2021-11-01 ENCOUNTER — Ambulatory Visit (HOSPITAL_BASED_OUTPATIENT_CLINIC_OR_DEPARTMENT_OTHER): Payer: 59 | Admitting: Nurse Practitioner

## 2021-11-02 ENCOUNTER — Other Ambulatory Visit: Payer: Self-pay

## 2021-11-02 ENCOUNTER — Encounter (HOSPITAL_BASED_OUTPATIENT_CLINIC_OR_DEPARTMENT_OTHER): Payer: Self-pay | Admitting: Nurse Practitioner

## 2021-11-02 ENCOUNTER — Ambulatory Visit (INDEPENDENT_AMBULATORY_CARE_PROVIDER_SITE_OTHER): Payer: 59 | Admitting: Nurse Practitioner

## 2021-11-02 DIAGNOSIS — I10 Essential (primary) hypertension: Secondary | ICD-10-CM

## 2021-11-02 DIAGNOSIS — E1149 Type 2 diabetes mellitus with other diabetic neurological complication: Secondary | ICD-10-CM

## 2021-11-02 DIAGNOSIS — G63 Polyneuropathy in diseases classified elsewhere: Secondary | ICD-10-CM

## 2021-11-02 DIAGNOSIS — N183 Chronic kidney disease, stage 3 unspecified: Secondary | ICD-10-CM

## 2021-11-02 DIAGNOSIS — E785 Hyperlipidemia, unspecified: Secondary | ICD-10-CM | POA: Diagnosis not present

## 2021-11-02 DIAGNOSIS — I152 Hypertension secondary to endocrine disorders: Secondary | ICD-10-CM

## 2021-11-02 DIAGNOSIS — J3089 Other allergic rhinitis: Secondary | ICD-10-CM

## 2021-11-02 DIAGNOSIS — G4700 Insomnia, unspecified: Secondary | ICD-10-CM

## 2021-11-02 DIAGNOSIS — E1159 Type 2 diabetes mellitus with other circulatory complications: Secondary | ICD-10-CM

## 2021-11-02 DIAGNOSIS — E1122 Type 2 diabetes mellitus with diabetic chronic kidney disease: Secondary | ICD-10-CM

## 2021-11-02 DIAGNOSIS — B49 Unspecified mycosis: Secondary | ICD-10-CM

## 2021-11-02 DIAGNOSIS — Z794 Long term (current) use of insulin: Secondary | ICD-10-CM

## 2021-11-02 DIAGNOSIS — I5032 Chronic diastolic (congestive) heart failure: Secondary | ICD-10-CM

## 2021-11-02 HISTORY — DX: Unspecified mycosis: B49

## 2021-11-02 MED ORDER — ATORVASTATIN CALCIUM 20 MG PO TABS: 20.0000 mg | ORAL_TABLET | Freq: Every day | ORAL | 3 refills | Status: DC

## 2021-11-02 MED ORDER — AMBULATORY NON FORMULARY MEDICATION: 0 refills | Status: DC

## 2021-11-02 NOTE — Assessment & Plan Note (Signed)
Recommend patient avoid starting amitriptyline and pregabalin.  She endorses control with nortriptyline, therefore, do not see the benefit in transitioning to amitriptyline at this time. Patient has a history of suicidal thoughts with pregabalin.  Instructions provided for patient to continue nortriptyline at bedtime.  We will monitor in February for any changes.

## 2021-11-02 NOTE — Assessment & Plan Note (Signed)
Unable to measure BG or A1c today as patient changed to VV for follow-up.  I am concerned with her reports that she is not taking her BG regularly, but endorses she is still using the SS insulin with meals. Expressed importance of measure BG and dosing the medication based on the numbers.  Additional concerns with missed doses of Basaglar. Expressed importance of taking this on a daily basis to help manage BG levels on a consistent basis.  Reviewed medications and instructions with taking medications with patient today.  She may benefit from a CGM so that she does not have to remember to take her BG multiple times a day. This may also help with medication dosing reminders and allow [place] to see her BG levels on a consistent basis.  Will follow-up with labs in February.

## 2021-11-02 NOTE — Assessment & Plan Note (Signed)
See type 2DM with St3 CKD note from today

## 2021-11-02 NOTE — Assessment & Plan Note (Signed)
Discussion on medications with patient today with instructions to take hydroxyzine for sleep.  Instructions to stop temazepam, amitriptyline, benadryl, and pregabalin. Hydroxyzine is working for patient with fever risks and side effects, therefore will plan to continue that at current dose as needed for sleep.

## 2021-11-02 NOTE — Assessment & Plan Note (Addendum)
>>  ASSESSMENT AND PLAN FOR TYPE 2 DIABETES MELLITUS WITH STAGE 3 CHRONIC KIDNEY DISEASE, WITH LONG-TERM CURRENT USE OF INSULIN (HCC) WRITTEN ON 11/02/2021  1:00 PM BY Chayanne Speir E, NP  Currently managed with Novolog on SS TID with meals, Basaglar 22u, and Farxiga 10mg  daily.  It is unclear how often she is checking her blood sugars or how consistently she is checking. It is also unclear how often she is taking the medication regularly.  This is quite a difficult situation to ensure proper dosing of medication is provided as I am not sure that her numbers accurately reflect her blood sugars with medications.  Reviewed medications and instructions with patient today.  A simpler regimen may be easier for the patient to help manage. I am considering a GLP-1 to replace the SS and basal insulin with a CGM to help with more accurate and close monitoring. Will discuss this with patient at next visit.  Will plan to continue Farxiga for kidney protection.   >>ASSESSMENT AND PLAN FOR TYPE II DIABETES MELLITUS WITH NEUROLOGICAL MANIFESTATIONS (HCC) WRITTEN ON 11/02/2021 12:57 PM BY Lilias Lorensen E, NP  Unable to measure BG or A1c today as patient changed to VV for follow-up.  I am concerned with her reports that she is not taking her BG regularly, but endorses she is still using the SS insulin with meals. Expressed importance of measure BG and dosing the medication based on the numbers.  Additional concerns with missed doses of Basaglar. Expressed importance of taking this on a daily basis to help manage BG levels on a consistent basis.  Reviewed medications and instructions with taking medications with patient today.  She may benefit from a CGM so that she does not have to remember to take her BG multiple times a day. This may also help with medication dosing reminders and allow 11/04/2021 to see her BG levels on a consistent basis.  Will follow-up with labs in February.

## 2021-11-02 NOTE — Assessment & Plan Note (Signed)
Followed by HTN clinic for resistant HTN in the setting of poorly controlled DM2.  Review of medications with patient today.  Instructions to continue losartan-hctz, amlodipine, metoprolol, and spironolactone daily.  Discussed importance of continuing medication with consistent use to prevent worsening heart disease and stroke.  Will plan to follow-up in February with labs.

## 2021-11-02 NOTE — Patient Instructions (Signed)
Stop taking/Do not take: Benadryl Amitriptyline Pregabalin Temazepam  Continue to take: For Diabetes- Novolog 8-16 units with each meal based on your blood sugar levels at the time of the meal [place] 10mg  per day Basaglar 22 units at bedtime ** Be sure you are taking these medications every day to help keep your blood sugar at best control** **Make sure you check your blood sugar with every meal so that you know how much insulin to give yourself for best control  For Blood Pressure and Heart Failure- Losartan- HCTZ 50-12.5mg  every morning Metoprolol 200mg  at bedtime Amlodipine10mg  every morning Spironolactone 25mg  every morning  For Cholesterol- Atorvastatin 20mg  at bedtime  For Nerve Pain- Nortriptyline 25mg  at bedtime  For Sleep- Hydroxyzine 25mg  at bedtime  You may take the Tizanidine as needed for muscle spasms- this can make you sleepy and cause brain fog so do not take during the day  I have some ideas of medication and management options that may help to reduce some of the medications that you take. We can talk about this when you come in in February. Would you be interested in considering a continuous blood glucose monitor that sticks on your arm and keeps check of your blood sugars 24 hours a day to help with control?

## 2021-11-02 NOTE — Assessment & Plan Note (Signed)
Patient has been out of atorvastatin for several weeks at this time.  Discussed the reason for the medication and importance of taking on routine basis to help protect from heart attack and stroke due to significant increased risks with co-morbid conditions.  Will send refills in today and plan to check labs when she comes back in February.

## 2021-11-02 NOTE — Progress Notes (Signed)
Virtual Visit Encounter telephone visit.   I connected with  Isidor Holts on 11/02/21 at 10:50 AM EST by secure audio and/or video enabled telemedicine application. I verified that I am speaking with the correct person using two identifiers.   I introduced myself as a Publishing rights manager with the practice. The limitations of evaluation and management by telemedicine discussed with the patient and the availability of in person appointments. The patient expressed verbal understanding and consent to proceed.  Participating parties in this visit include: Myself and patient  The patient is: Patient Location: Home I am: Provider Location: Office/Clinic Subjective:    CC and HPI: Cynthia [name] is a 65 y.o. year old female presenting for follow up of medication management.  Cynthia [name] reports today that she is confused with the medications she is taking and would like to go over these medications to determine what she should be taking and see if anything can be eliminated.  She endorses that she has not been doing the best with checking or blood sugars or taking her medication on a routine basis. She recently made a trip to [facility] to help her mother who is ill and found that it was difficult to maintain her routine. She also reports significant increase in stress while there and she felt that her blood glucose was elevated due to this.   She endorses working to follow a low carbohydrate diet with no pasta, cereal, or potatoes. She is using "skinny bread". She tells me when she has checked her blood sugars they have been between 120 and 180, but endorses this in not regular.   She reports that she was started on amitriptyline and pregabablin by the spine doctor, but she has a history of severe depression and suicidal thoughts while on pregabalin so she has not started this. She is currently taking nortriptyline for neuropathic pain, which helps. She is not sure if she should be taking both.   She  reports that she has been taking hydroxyzine for sleep and this is working well for her. She tells me that she also takes Benadryl on occasion.  She has tizanidine listed on her medication however she reports she is not taking this any longer.  She also has chlorthalidone listed on her medication chart but she reports she is no longer taking this.  She tells me she was recently seen by an allergist for chronic rhinitis and it was determined that she has an allergy to dust.  She reports that she was told by her insurance company that she is eligible for a air purifier of a prescription is written and she is requesting that today.  Past medical history, Surgical history, Family history not pertinant except as noted below, Social history, Allergies, and medications have been entered into the medical record, reviewed, and corrections made.   Review of Systems:  All review of systems negative except what is listed in the HPI  Objective:    Alert and oriented x 4 Speaking in clear sentences with no shortness of breath. No distress.  Impression and Recommendations:    Problem List Items Addressed This Visit     Type II diabetes mellitus with neurological manifestations (HCC)    Unable to measure BG or A1c today as patient changed to VV for follow-up.  I am concerned with her reports that she is not taking her BG regularly, but endorses she is still using the SS insulin with meals. Expressed importance of measure BG and dosing the  medication based on the numbers.  Additional concerns with missed doses of Basaglar. Expressed importance of taking this on a daily basis to help manage BG levels on a consistent basis.  Reviewed medications and instructions with taking medications with patient today.  She may benefit from a CGM so that she does not have to remember to take her BG multiple times a day. This may also help with medication dosing reminders and allow [place] to see her BG levels on a consistent  basis.  Will follow-up with labs in February.       Relevant Medications   atorvastatin (LIPITOR) 20 MG tablet   Stage 3 chronic kidney disease ([place])    See type 2DM with St3 CKD note from today      Hyperlipidemia LDL goal <100 - Primary    Patient has been out of atorvastatin for several weeks at this time.  Discussed the reason for the medication and importance of taking on routine basis to help protect from heart attack and stroke due to significant increased risks with co-morbid conditions.  Will send refills in today and plan to check labs when she comes back in February.       Relevant Medications   atorvastatin (LIPITOR) 20 MG tablet   Polyneuropathy associated with underlying disease ([place])    Recommend patient avoid starting amitriptyline and pregabalin.  She endorses control with nortriptyline, therefore, do not see the benefit in transitioning to amitriptyline at this time. Patient has a history of suicidal thoughts with pregabalin.  Instructions provided for patient to continue nortriptyline at bedtime.  We will monitor in February for any changes.        Chronic diastolic heart failure (HCC)    HF in the setting of poorly controlled DM2, resistant HTN, Stg 3 CKD, and OSA.  She is followed by the HTN clinic/cardiology.  Discussed medications with patient today and provided written instructions on AVS for medications to continue taking and for what conditions these were given. Patient strongly encouraged to continue medications on consistent daily basis and avoid missing or skipped doses to help prevent worsening conditions.  We will plan to follow-up in person in February for re-evaluation and labs.       Relevant Medications   atorvastatin (LIPITOR) 20 MG tablet   Hypertension associated with diabetes ([place])    Followed by HTN clinic for resistant HTN in the setting of poorly controlled DM2.  Review of medications with patient today.  Instructions to continue  losartan-hctz, amlodipine, metoprolol, and spironolactone daily.  Discussed importance of continuing medication with consistent use to prevent worsening heart disease and stroke.  Will plan to follow-up in February with labs.       Relevant Medications   atorvastatin (LIPITOR) 20 MG tablet   Insomnia    Discussion on medications with patient today with instructions to take hydroxyzine for sleep.  Instructions to stop temazepam, amitriptyline, benadryl, and pregabalin. Hydroxyzine is working for patient with fever risks and side effects, therefore will plan to continue that at current dose as needed for sleep.        Type 2 diabetes mellitus with stage 3 chronic kidney disease, with long-term current use of insulin ([place])    Currently managed with Novolog on SS TID with meals, Basaglar 22u, and Farxiga 10mg  daily.  It is unclear how often she is checking her blood sugars or how consistently she is checking. It is also unclear how often she is taking the medication regularly.  This is quite a difficult situation to ensure proper dosing of medication is provided as I am not sure that her numbers accurately reflect her blood sugars with medications.  Reviewed medications and instructions with patient today.  A simpler regimen may be easier for the patient to help manage. I am considering a GLP-1 to replace the SS and basal insulin with a CGM to help with more accurate and close monitoring. Will discuss this with patient at next visit.  Will plan to continue Farxiga for kidney protection.       Relevant Medications   atorvastatin (LIPITOR) 20 MG tablet    orders and follow up as documented in EMR, reviewed compliance with lifestyle measures, reviewed medications and side effects in detail, instructions provided in detail on AVS for review.  I discussed the assessment and treatment plan with the patient. The patient was provided an opportunity to ask questions and all were answered. The patient  agreed with the plan and demonstrated an understanding of the instructions.   The patient was advised to call back or seek an in-person evaluation if the symptoms worsen or if the condition fails to improve as anticipated.  Follow-Up: February  I provided 35 minutes of non-face-to-face interaction with this non face-to-face encounter including intake, same-day documentation, and chart review.   Orma Render, NP , DNP, AGNP-c [place] [facility] [phone] (fax)

## 2021-11-02 NOTE — Assessment & Plan Note (Signed)
HF in the setting of poorly controlled DM2, resistant HTN, Stg 3 CKD, and OSA.  She is followed by the HTN clinic/cardiology.  Discussed medications with patient today and provided written instructions on AVS for medications to continue taking and for what conditions these were given. Patient strongly encouraged to continue medications on consistent daily basis and avoid missing or skipped doses to help prevent worsening conditions.  We will plan to follow-up in person in February for re-evaluation and labs.

## 2021-11-03 ENCOUNTER — Telehealth (HOSPITAL_BASED_OUTPATIENT_CLINIC_OR_DEPARTMENT_OTHER): Payer: Self-pay

## 2021-11-03 NOTE — Telephone Encounter (Signed)
Script for [employer] was mailed to patient.   [employer]

## 2021-11-04 ENCOUNTER — Encounter (HOSPITAL_BASED_OUTPATIENT_CLINIC_OR_DEPARTMENT_OTHER): Payer: Self-pay | Admitting: Nurse Practitioner

## 2021-11-04 ENCOUNTER — Encounter (HOSPITAL_BASED_OUTPATIENT_CLINIC_OR_DEPARTMENT_OTHER): Payer: Self-pay

## 2021-11-07 ENCOUNTER — Encounter (HOSPITAL_BASED_OUTPATIENT_CLINIC_OR_DEPARTMENT_OTHER): Payer: Self-pay

## 2021-11-07 ENCOUNTER — Telehealth (HOSPITAL_BASED_OUTPATIENT_CLINIC_OR_DEPARTMENT_OTHER): Payer: Self-pay | Admitting: *Deleted

## 2021-11-07 NOTE — Telephone Encounter (Signed)
Called patient and left message needed to reschedule appointment 12/27 secondary to Dr Leonides Sake flight home being cancelled

## 2021-11-08 ENCOUNTER — Ambulatory Visit (HOSPITAL_BASED_OUTPATIENT_CLINIC_OR_DEPARTMENT_OTHER): Payer: Medicare Other | Admitting: Cardiovascular Disease

## 2021-11-09 NOTE — Telephone Encounter (Signed)
Rescheduled patient for later this month

## 2021-11-10 ENCOUNTER — Encounter (HOSPITAL_BASED_OUTPATIENT_CLINIC_OR_DEPARTMENT_OTHER): Payer: Self-pay | Admitting: Nurse Practitioner

## 2021-11-21 ENCOUNTER — Telehealth: Payer: Self-pay

## 2021-11-21 DIAGNOSIS — Z0000 Encounter for general adult medical examination without abnormal findings: Secondary | ICD-10-CM

## 2021-11-21 NOTE — Telephone Encounter (Signed)
Called patient to follow up on her survey responses in [employer] regarding difficulties with sleeping and not getting enough exercise to assist with health coaching to improve these areas. Patient did not answer. No voicemail option was provided and could not leave a message.    Chandra Feger Nedra Hai, [facility] Guide, Health Coach [address] [place] [address] Telephone: [phone] Email: [email or web address]

## 2021-11-22 ENCOUNTER — Ambulatory Visit (HOSPITAL_BASED_OUTPATIENT_CLINIC_OR_DEPARTMENT_OTHER): Payer: Medicare Other | Admitting: Cardiovascular Disease

## 2021-11-24 ENCOUNTER — Encounter (HOSPITAL_BASED_OUTPATIENT_CLINIC_OR_DEPARTMENT_OTHER): Payer: Self-pay | Admitting: Nurse Practitioner

## 2021-11-25 ENCOUNTER — Encounter (HOSPITAL_BASED_OUTPATIENT_CLINIC_OR_DEPARTMENT_OTHER): Payer: Self-pay | Admitting: Nurse Practitioner

## 2021-11-25 ENCOUNTER — Other Ambulatory Visit: Payer: Self-pay

## 2021-11-25 ENCOUNTER — Ambulatory Visit (INDEPENDENT_AMBULATORY_CARE_PROVIDER_SITE_OTHER): Payer: HMO | Admitting: Nurse Practitioner

## 2021-11-25 VITALS — BP 135/74 | HR 66 | Ht 66.0 in | Wt 256.0 lb

## 2021-11-25 DIAGNOSIS — M898X8 Other specified disorders of bone, other site: Secondary | ICD-10-CM | POA: Insufficient documentation

## 2021-11-25 DIAGNOSIS — M542 Cervicalgia: Secondary | ICD-10-CM | POA: Diagnosis not present

## 2021-11-25 MED ORDER — MELOXICAM 15 MG PO TABS: 15.0000 mg | ORAL_TABLET | Freq: Every day | ORAL | 0 refills | Status: DC
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
## 2021-11-25 NOTE — Assessment & Plan Note (Signed)
Approximate 6 6 cm symmetrical circular mass noted to the superior midsternal region. Etiology at this time is unknown. No associated symptoms that could alert of possible etiology. On evaluation today the area is soft, nonmobile, nontender, nonfevered. There is no changes to the skin. Recommend further evaluation with ultrasound to attempt to determine make up of mass and if further evaluation is required at this time. Will follow-up based on imaging results.

## 2021-11-25 NOTE — Patient Instructions (Signed)
We will let you know the results of the ultrasound and how to best proceed

## 2021-11-29 ENCOUNTER — Telehealth: Payer: Self-pay | Admitting: *Deleted

## 2021-11-29 NOTE — Telephone Encounter (Signed)
Noted blood pressure elevated from last night.  Texted pt via [employer] portal x 2 and called pt at home. No answer and left voice message to please take her blood pressure.

## 2021-11-30 ENCOUNTER — Ambulatory Visit
Admission: RE | Admit: 2021-11-30 | Discharge: 2021-11-30 | Disposition: A | Discharge status: Home / Self Care | Payer: HMO | Source: Ambulatory Visit | Attending: Nurse Practitioner | Admitting: Nurse Practitioner

## 2021-11-30 ENCOUNTER — Other Ambulatory Visit: Payer: Self-pay

## 2021-11-30 ENCOUNTER — Other Ambulatory Visit (HOSPITAL_BASED_OUTPATIENT_CLINIC_OR_DEPARTMENT_OTHER): Payer: Self-pay

## 2021-11-30 ENCOUNTER — Encounter (HOSPITAL_BASED_OUTPATIENT_CLINIC_OR_DEPARTMENT_OTHER): Payer: Self-pay | Admitting: Nurse Practitioner

## 2021-11-30 DIAGNOSIS — Z794 Long term (current) use of insulin: Secondary | ICD-10-CM

## 2021-11-30 DIAGNOSIS — E1165 Type 2 diabetes mellitus with hyperglycemia: Secondary | ICD-10-CM

## 2021-11-30 DIAGNOSIS — M898X8 Other specified disorders of bone, other site: Secondary | ICD-10-CM

## 2021-11-30 MED ORDER — DAPAGLIFLOZIN PROPANEDIOL 10 MG PO TABS: 10.0000 mg | ORAL_TABLET | Freq: Every day | ORAL | 0 refills | Status: DC

## 2021-12-02 ENCOUNTER — Encounter (HOSPITAL_BASED_OUTPATIENT_CLINIC_OR_DEPARTMENT_OTHER): Payer: Self-pay | Admitting: Nurse Practitioner

## 2021-12-02 DIAGNOSIS — M898X8 Other specified disorders of bone, other site: Secondary | ICD-10-CM

## 2021-12-02 DIAGNOSIS — I10 Essential (primary) hypertension: Secondary | ICD-10-CM | POA: Diagnosis not present

## 2021-12-05 ENCOUNTER — Telehealth: Payer: Self-pay

## 2021-12-05 ENCOUNTER — Other Ambulatory Visit (HOSPITAL_BASED_OUTPATIENT_CLINIC_OR_DEPARTMENT_OTHER): Payer: Self-pay | Admitting: Nurse Practitioner

## 2021-12-05 ENCOUNTER — Telehealth: Payer: Self-pay | Admitting: Cardiovascular Disease

## 2021-12-05 ENCOUNTER — Other Ambulatory Visit: Payer: Self-pay | Admitting: Pulmonary Disease

## 2021-12-05 DIAGNOSIS — G4700 Insomnia, unspecified: Secondary | ICD-10-CM

## 2021-12-05 NOTE — Telephone Encounter (Signed)
ATC patient to make an appt since she has not been seen in a year for refill. No VM available

## 2021-12-07 ENCOUNTER — Other Ambulatory Visit: Payer: Self-pay | Admitting: Pulmonary Disease

## 2021-12-07 ENCOUNTER — Ambulatory Visit (HOSPITAL_BASED_OUTPATIENT_CLINIC_OR_DEPARTMENT_OTHER): Payer: 59 | Admitting: Nurse Practitioner

## 2021-12-08 NOTE — Telephone Encounter (Signed)
Patient wanted to know if she should use equipment that came with [employer] app or other devices. Told patient to use equipment that came with [employer] app. Other devices are reading higher.

## 2021-12-16 ENCOUNTER — Ambulatory Visit (HOSPITAL_COMMUNITY)
Admission: EM | Admit: 2021-12-16 | Discharge: 2021-12-16 | Disposition: A | Discharge status: Home / Self Care | Payer: 59 | Attending: Family Medicine | Admitting: Family Medicine

## 2021-12-16 ENCOUNTER — Encounter (HOSPITAL_COMMUNITY): Payer: Self-pay | Admitting: Emergency Medicine

## 2021-12-16 ENCOUNTER — Other Ambulatory Visit: Payer: Self-pay

## 2021-12-16 DIAGNOSIS — R110 Nausea: Secondary | ICD-10-CM

## 2021-12-16 DIAGNOSIS — R5383 Other fatigue: Secondary | ICD-10-CM | POA: Diagnosis present

## 2021-12-16 DIAGNOSIS — R519 Headache, unspecified: Secondary | ICD-10-CM | POA: Diagnosis present

## 2021-12-16 LAB — CBC WITH DIFFERENTIAL/PLATELET
Abs Immature Granulocytes: 0.01 10*3/uL (ref 0.00–0.07)
Basophils Absolute: 0 10*3/uL (ref 0.0–0.1)
Basophils Relative: 1 %
Eosinophils Absolute: 0.2 10*3/uL (ref 0.0–0.5)
Eosinophils Relative: 3 %
HCT: 39.3 % (ref 36.0–46.0)
Hemoglobin: 12 g/dL (ref 12.0–15.0)
Immature Granulocytes: 0 %
Lymphocytes Relative: 27 %
Lymphs Abs: 1.5 10*3/uL (ref 0.7–4.0)
MCH: 27.3 pg (ref 26.0–34.0)
MCHC: 30.5 g/dL (ref 30.0–36.0)
MCV: 89.5 fL (ref 80.0–100.0)
Monocytes Absolute: 0.4 10*3/uL (ref 0.1–1.0)
Monocytes Relative: 8 %
Neutro Abs: 3.4 10*3/uL (ref 1.7–7.7)
Neutrophils Relative %: 61 %
Platelets: 362 10*3/uL (ref 150–400)
RBC: 4.39 MIL/uL (ref 3.87–5.11)
RDW: 13.3 % (ref 11.5–15.5)
WBC: 5.5 10*3/uL (ref 4.0–10.5)
nRBC: 0 % (ref 0.0–0.2)

## 2021-12-16 LAB — COMPREHENSIVE METABOLIC PANEL
ALT: 13 U/L (ref 0–44)
AST: 12 U/L — ABNORMAL LOW (ref 15–41)
Albumin: 3.6 g/dL (ref 3.5–5.0)
Alkaline Phosphatase: 79 U/L (ref 38–126)
Anion gap: 6 (ref 5–15)
BUN: 18 mg/dL (ref 8–23)
CO2: 26 mmol/L (ref 22–32)
Calcium: 9.1 mg/dL (ref 8.9–10.3)
Chloride: 102 mmol/L (ref 98–111)
Creatinine, Ser: 1.46 mg/dL — ABNORMAL HIGH (ref 0.44–1.00)
GFR, Estimated: 39 mL/min — ABNORMAL LOW (ref >60)
Glucose, Bld: 239 mg/dL — ABNORMAL HIGH (ref 70–99)
Potassium: 4.3 mmol/L (ref 3.5–5.1)
Sodium: 134 mmol/L — ABNORMAL LOW (ref 135–145)
Total Bilirubin: 0.5 mg/dL (ref 0.3–1.2)
Total Protein: 7.1 g/dL (ref 6.5–8.1)

## 2021-12-16 MED ORDER — HYDROCODONE-ACETAMINOPHEN 5-325 MG PO TABS
2.0000 | ORAL_TABLET | ORAL | 0 refills | Status: DC | PRN
Start: 2021-12-16 — End: 2021-12-23

## 2021-12-16 NOTE — ED Triage Notes (Signed)
Pt reports last day of quarantine from Covid was last Friday. Still having headaches, fatigue, sleeping a lot.

## 2021-12-16 NOTE — Discharge Instructions (Signed)
You were seen today for headache, fatigue, nausea after having had covid.  Your exam is normal today.  I have ordered lab work which should be resulted by tomorrow.  You will be contacted if there is a major concern.  I have sent out a pain reliever for your headache.  If you headache worsens, you develop vomiting, blurry vision, etc, then please go to the ER for evaluation.
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
## 2021-12-22 ENCOUNTER — Telehealth (HOSPITAL_COMMUNITY): Payer: Self-pay | Admitting: Emergency Medicine

## 2021-12-22 NOTE — Telephone Encounter (Signed)
Patient and I have left voicemails for each other a few times.  She gave me permission to leave results from recent visit on voicemail, which I did.  Relayed message from Dr. Marlinda Mike and explained f/u.

## 2021-12-23 ENCOUNTER — Ambulatory Visit (INDEPENDENT_AMBULATORY_CARE_PROVIDER_SITE_OTHER): Payer: HMO | Admitting: Nurse Practitioner

## 2021-12-23 ENCOUNTER — Other Ambulatory Visit: Payer: Self-pay

## 2021-12-23 ENCOUNTER — Encounter (HOSPITAL_BASED_OUTPATIENT_CLINIC_OR_DEPARTMENT_OTHER): Payer: Self-pay | Admitting: Nurse Practitioner

## 2021-12-23 DIAGNOSIS — G43809 Other migraine, not intractable, without status migrainosus: Secondary | ICD-10-CM

## 2021-12-23 DIAGNOSIS — G43909 Migraine, unspecified, not intractable, without status migrainosus: Secondary | ICD-10-CM | POA: Insufficient documentation

## 2021-12-23 HISTORY — DX: Migraine, unspecified, not intractable, without status migrainosus: G43.909

## 2021-12-23 MED ORDER — RIZATRIPTAN BENZOATE 10 MG PO TABS: 10.0000 mg | ORAL_TABLET | ORAL | 1 refills | Status: DC | PRN

## 2021-12-23 NOTE — Assessment & Plan Note (Signed)
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 Patient has not ever taken medication for migraine headaches.  No alarm symptoms are present today. We will send in prescription for rizatriptan for patient to use in the event that the symptoms do return.  Recommend taking medication and lying down in a dark place for at least 20 minutes following taking the medication to allow it to work. If symptoms fail to improve with treatment or worsen patient will follow-up.

## 2021-12-23 NOTE — Patient Instructions (Addendum)
I have sent Maxalt in for you for the headaches.  At the first sign of headache take the medication and lay down for a minimum of 20 to 30 minutes in a dark quiet room.  If symptoms do not resolve after 2 hours you may repeat the dose.  Be sure you do not take more than 2 doses within a 24-hour timeframe.  If this does not help your symptoms or your symptoms worsen please let us know.

## 2021-12-23 NOTE — Progress Notes (Signed)
Virtual Visit Encounter telephone visit.   I connected with  Cynthia [name] on 12/23/21 at 11:10 AM EST by secure audio and/or video enabled telemedicine application. I verified that I am speaking with the correct person using two identifiers.   I introduced myself as a Designer, jewellery with the practice. The limitations of evaluation and management by telemedicine discussed with the patient and the availability of in person appointments. The patient expressed verbal understanding and consent to proceed.  Participating parties in this visit include: Myself and patient  The patient is: Patient Location: Home I am: Provider Location: Office/Clinic Subjective:    CC and HPI: Cynthia [name] is a 66 y.o. year old female presenting for new evaluation and treatment of headache and nausea. Patient endorses developing COVID about 3 weeks ago. Since that time she has been experiencing intermittent nausea and headaches that are not responding to tylenol. She has also tried tizanidine and this did not help. In addition to the other symptoms, she also reports abdominal spasms occurring without diarrhea or emesis. She is afebrile. She tells me that she feels her symptoms are improving this morning as she has not had a headache or abdominal spasms. She was seen in the UC last week fro the symptoms and she was told this was likely related to [place].  She is not having any fever, chills, body aches. This morning she has not had any N/V/D, weakness, headache, abdominal pain, or other symptoms.   Past medical history, Surgical history, Family history not pertinant except as noted below, Social history, Allergies, and medications have been entered into the medical record, reviewed, and corrections made.   Review of Systems:  All review of systems negative except what is listed in the HPI  Objective:    Alert and oriented x 4 Speaking in clear sentences with no shortness of breath. No  distress.  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 Patient has not ever taken medication for migraine headaches.  No alarm symptoms are present today. We will send in prescription for rizatriptan for patient to use in the event that the symptoms do return.  Recommend taking medication and lying down in a dark place for at least 20 minutes following taking the medication to allow it to work. If symptoms fail to improve with treatment or worsen patient will follow-up.      Relevant Medications   rizatriptan (MAXALT) 10 MG tablet    orders and follow up as documented in EMR I discussed the assessment and treatment plan with the patient. The patient was provided an opportunity to ask questions and all were answered. The patient agreed with the plan and demonstrated an understanding of the instructions.   The patient was advised to call back or seek an in-person evaluation if the symptoms worsen or if the condition fails to improve as anticipated.  Follow-Up: prn  I provided 16 minutes of non-face-to-face interaction with this non face-to-face encounter including intake, same-day documentation, and chart review.   Orma Render, NP , DNP, AGNP-c [place] [facility] [phone] (fax)

## 2021-12-29 ENCOUNTER — Institutional Professional Consult (permissible substitution): Payer: Medicare Other | Admitting: Neurology

## 2021-12-29 ENCOUNTER — Encounter: Payer: Self-pay | Admitting: Neurology

## 2022-01-01 DIAGNOSIS — I10 Essential (primary) hypertension: Secondary | ICD-10-CM | POA: Diagnosis not present

## 2022-01-08 ENCOUNTER — Other Ambulatory Visit: Payer: Self-pay

## 2022-01-08 ENCOUNTER — Emergency Department (HOSPITAL_BASED_OUTPATIENT_CLINIC_OR_DEPARTMENT_OTHER)
Admission: EM | Admit: 2022-01-08 | Discharge: 2022-01-08 | Disposition: A | Discharge status: Home / Self Care | Payer: 59 | Attending: Emergency Medicine | Admitting: Emergency Medicine

## 2022-01-08 ENCOUNTER — Emergency Department (HOSPITAL_BASED_OUTPATIENT_CLINIC_OR_DEPARTMENT_OTHER): Payer: 59

## 2022-01-08 ENCOUNTER — Encounter (HOSPITAL_BASED_OUTPATIENT_CLINIC_OR_DEPARTMENT_OTHER): Payer: Self-pay | Admitting: Emergency Medicine

## 2022-01-08 DIAGNOSIS — M545 Low back pain, unspecified: Secondary | ICD-10-CM | POA: Diagnosis present

## 2022-01-08 DIAGNOSIS — Z7984 Long term (current) use of oral hypoglycemic drugs: Secondary | ICD-10-CM | POA: Diagnosis not present

## 2022-01-08 DIAGNOSIS — I130 Hypertensive heart and chronic kidney disease with heart failure and stage 1 through stage 4 chronic kidney disease, or unspecified chronic kidney disease: Secondary | ICD-10-CM | POA: Diagnosis not present

## 2022-01-08 DIAGNOSIS — E1165 Type 2 diabetes mellitus with hyperglycemia: Secondary | ICD-10-CM | POA: Diagnosis not present

## 2022-01-08 DIAGNOSIS — I509 Heart failure, unspecified: Secondary | ICD-10-CM | POA: Insufficient documentation

## 2022-01-08 DIAGNOSIS — Z794 Long term (current) use of insulin: Secondary | ICD-10-CM | POA: Diagnosis not present

## 2022-01-08 DIAGNOSIS — R109 Unspecified abdominal pain: Secondary | ICD-10-CM | POA: Insufficient documentation

## 2022-01-08 DIAGNOSIS — E162 Hypoglycemia, unspecified: Secondary | ICD-10-CM

## 2022-01-08 DIAGNOSIS — Z79899 Other long term (current) drug therapy: Secondary | ICD-10-CM | POA: Diagnosis not present

## 2022-01-08 DIAGNOSIS — R110 Nausea: Secondary | ICD-10-CM | POA: Insufficient documentation

## 2022-01-08 DIAGNOSIS — N189 Chronic kidney disease, unspecified: Secondary | ICD-10-CM | POA: Diagnosis not present

## 2022-01-08 LAB — CBC WITH DIFFERENTIAL/PLATELET
Abs Immature Granulocytes: 0.01 10*3/uL (ref 0.00–0.07)
Basophils Absolute: 0.1 10*3/uL (ref 0.0–0.1)
Basophils Relative: 1 %
Eosinophils Absolute: 0.2 10*3/uL (ref 0.0–0.5)
Eosinophils Relative: 4 %
HCT: 39.9 % (ref 36.0–46.0)
Hemoglobin: 12.4 g/dL (ref 12.0–15.0)
Immature Granulocytes: 0 %
Lymphocytes Relative: 40 %
Lymphs Abs: 2.2 10*3/uL (ref 0.7–4.0)
MCH: 27.3 pg (ref 26.0–34.0)
MCHC: 31.1 g/dL (ref 30.0–36.0)
MCV: 87.9 fL (ref 80.0–100.0)
Monocytes Absolute: 0.4 10*3/uL (ref 0.1–1.0)
Monocytes Relative: 8 %
Neutro Abs: 2.6 10*3/uL (ref 1.7–7.7)
Neutrophils Relative %: 47 %
Platelets: 322 10*3/uL (ref 150–400)
RBC: 4.54 MIL/uL (ref 3.87–5.11)
RDW: 14.1 % (ref 11.5–15.5)
WBC: 5.4 10*3/uL (ref 4.0–10.5)
nRBC: 0 % (ref 0.0–0.2)

## 2022-01-08 LAB — COMPREHENSIVE METABOLIC PANEL
ALT: 19 U/L (ref 0–44)
AST: 19 U/L (ref 15–41)
Albumin: 4.2 g/dL (ref 3.5–5.0)
Alkaline Phosphatase: 62 U/L (ref 38–126)
Anion gap: 8 (ref 5–15)
BUN: 25 mg/dL — ABNORMAL HIGH (ref 8–23)
CO2: 27 mmol/L (ref 22–32)
Calcium: 9.8 mg/dL (ref 8.9–10.3)
Chloride: 104 mmol/L (ref 98–111)
Creatinine, Ser: 1.5 mg/dL — ABNORMAL HIGH (ref 0.44–1.00)
GFR, Estimated: 38 mL/min — ABNORMAL LOW (ref >60)
Glucose, Bld: 37 mg/dL — CL (ref 70–99)
Potassium: 3.8 mmol/L (ref 3.5–5.1)
Sodium: 139 mmol/L (ref 135–145)
Total Bilirubin: 0.4 mg/dL (ref 0.3–1.2)
Total Protein: 7.9 g/dL (ref 6.5–8.1)

## 2022-01-08 LAB — URINALYSIS, ROUTINE W REFLEX MICROSCOPIC
Bilirubin Urine: NEGATIVE
Glucose, UA: >1000 mg/dL — AB
Hgb urine dipstick: NEGATIVE
Ketones, ur: NEGATIVE mg/dL
Leukocytes,Ua: NEGATIVE
Nitrite: NEGATIVE
Specific Gravity, Urine: 1.019 (ref 1.005–1.030)
pH: 6.5 (ref 5.0–8.0)

## 2022-01-08 LAB — CBG MONITORING, ED
Glucose-Capillary: 100 mg/dL — ABNORMAL HIGH (ref 70–99)
Glucose-Capillary: 90 mg/dL (ref 70–99)

## 2022-01-08 MED ORDER — OXYCODONE-ACETAMINOPHEN 5-325 MG PO TABS: 1.0000 | ORAL_TABLET | Freq: Three times a day (TID) | ORAL | 0 refills | Status: AC | PRN

## 2022-01-08 MED ORDER — DIPHENHYDRAMINE HCL 25 MG PO CAPS
25.0000 mg | ORAL_CAPSULE | Freq: Once | ORAL | Status: AC
  Administered 2022-01-08: 25 mg via ORAL
  Filled 2022-01-08: qty 1

## 2022-01-08 MED ORDER — OXYCODONE-ACETAMINOPHEN 5-325 MG PO TABS
1.0000 | ORAL_TABLET | Freq: Once | ORAL | Status: AC
  Administered 2022-01-08: 1 via ORAL
  Filled 2022-01-08: qty 1

## 2022-01-08 MED ORDER — DEXTROSE 50 % IV SOLN: 1.0000 | Freq: Once | INTRAVENOUS | Status: DC

## 2022-01-08 MED ORDER — GLUCOSE 40 % PO GEL
ORAL | Status: AC
  Filled 2022-01-08: qty 1

## 2022-01-08 NOTE — ED Triage Notes (Signed)
Recurrent Lower back pain x 1 day radiating to bilateral lower abdomen .  Hx arthritis , denies urinary symptoms , denies NV
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
## 2022-01-08 NOTE — ED Notes (Signed)
CRITICAL VALUE STICKER  CRITICAL VALUE: Glucose 37   RECEIVER (on-site recipient of call):Gaylen Pereira, RN  DATE & TIME NOTIFIED: 01/08/22 1453  MESSENGER (representative from lab):Misty Stanley  MD NOTIFIED: Wallace Cullens, DO  TIME OF NOTIFICATION:1453  RESPONSE:

## 2022-01-10 ENCOUNTER — Encounter (HOSPITAL_BASED_OUTPATIENT_CLINIC_OR_DEPARTMENT_OTHER): Payer: Self-pay | Admitting: Nurse Practitioner

## 2022-01-10 ENCOUNTER — Other Ambulatory Visit (HOSPITAL_BASED_OUTPATIENT_CLINIC_OR_DEPARTMENT_OTHER): Payer: Self-pay | Admitting: Nurse Practitioner

## 2022-01-10 DIAGNOSIS — F5101 Primary insomnia: Secondary | ICD-10-CM

## 2022-01-10 LAB — URINE CULTURE: Culture: >=100000 — AB

## 2022-01-10 MED ORDER — ESZOPICLONE 2 MG PO TABS: 1.0000 mg | ORAL_TABLET | Freq: Every evening | ORAL | 0 refills | Status: DC | PRN

## 2022-01-11 ENCOUNTER — Telehealth: Payer: Self-pay

## 2022-01-11 DIAGNOSIS — Z0000 Encounter for general adult medical examination without abnormal findings: Secondary | ICD-10-CM

## 2022-01-11 NOTE — Telephone Encounter (Signed)
Called patient to discuss health coaching per [employer] survey responses regarding not following an eating and activity plan. Patient did not answer and voicemail was full. Was unable to leave a message at this time. ? ? ?Renaee Munda, CHWC ?[facility] ?Care Guide, Health Coach ?[address] ?[place] [address] ?Telephone: [phone] ?Email: [email or web address] ? ?

## 2022-01-11 NOTE — Telephone Encounter (Signed)
Post ED Visit - Positive Culture Follow-up ? ?Culture report reviewed by antimicrobial stewardship pharmacist: ?[facility] ?[]  , Enzo Bi.D. ?[]  [address], Pharm.D., BCPS AQ-ID ?[]  , Pharm.D., BCPS ?[]  Celedonio Miyamoto, Pharm.D., BCPS ?[]  [place], Garvin Fila.D., BCPS, AAHIVP ?[]  , Pharm.D., BCPS, AAHIVP ?[x]  Georgina Pillion, PharmD, BCPS ?[]  , PharmD, BCPS ?[]  [place], PharmD, BCPS ?[]  [address], PharmD ?[]  , PharmD, BCPS ?[]  Estella Husk, PharmD ? ? [facility] ?[]  Lysle Pearl, PharmD ?[]  , PharmD ?[]  Phillips Climes, PharmD ?[]  , Rph ?[]  Agapito Games) , PharmD ?[]  Verlan Friends, PharmD ?[]  , PharmD ?[]  Mervyn Gay, PharmD ?[]  , PharmD ?[]  Vinnie Level, PharmD ?[]  Gerri Spore, PharmD ?[]  , PharmD ?[]  Len Childs, PharmD ? ? ?Positive urine culture ?Untreated, no urinary symptoms, no treatment, no further patient follow-up is required at this time. ? ? ?01/11/2022, 11:12 AM ?  ?

## 2022-01-17 ENCOUNTER — Encounter (HOSPITAL_BASED_OUTPATIENT_CLINIC_OR_DEPARTMENT_OTHER): Payer: Self-pay

## 2022-01-17 NOTE — Telephone Encounter (Signed)
Pts vm was full sent patient at MyChart message regarding to schedule. ?

## 2022-01-23 ENCOUNTER — Encounter (HOSPITAL_BASED_OUTPATIENT_CLINIC_OR_DEPARTMENT_OTHER): Payer: Self-pay | Admitting: Nurse Practitioner

## 2022-01-24 ENCOUNTER — Other Ambulatory Visit (HOSPITAL_BASED_OUTPATIENT_CLINIC_OR_DEPARTMENT_OTHER): Payer: Self-pay | Admitting: Nurse Practitioner

## 2022-01-24 DIAGNOSIS — G4700 Insomnia, unspecified: Secondary | ICD-10-CM

## 2022-01-24 DIAGNOSIS — G43809 Other migraine, not intractable, without status migrainosus: Secondary | ICD-10-CM

## 2022-01-24 DIAGNOSIS — G4733 Obstructive sleep apnea (adult) (pediatric): Secondary | ICD-10-CM

## 2022-01-31 ENCOUNTER — Encounter (HOSPITAL_BASED_OUTPATIENT_CLINIC_OR_DEPARTMENT_OTHER): Payer: Self-pay | Admitting: Nurse Practitioner

## 2022-01-31 DIAGNOSIS — R519 Headache, unspecified: Secondary | ICD-10-CM

## 2022-01-31 MED ORDER — NORTRIPTYLINE HCL 25 MG PO CAPS: ORAL_CAPSULE | ORAL | 0 refills | Status: DC

## 2022-02-15 ENCOUNTER — Telehealth: Payer: Self-pay

## 2022-02-15 DIAGNOSIS — Z0000 Encounter for general adult medical examination without abnormal findings: Secondary | ICD-10-CM
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
## 2022-02-15 NOTE — Telephone Encounter (Signed)
Called patient to inform her to return Vivify blood pressure device during f/u visit with Dr. Duke Salvia on 4/5. Patient did not answer. Left a message with information regarding returning device and contact information if she had questions. Sent f/u MyChart message. ? ?Renaee Munda, CHWC ?[facility] ?Care Guide, Health Coach ?[address] ?[place] [address] ?Telephone: [phone] ?Email: [email or web address] ? ?

## 2022-02-16 ENCOUNTER — Ambulatory Visit (HOSPITAL_BASED_OUTPATIENT_CLINIC_OR_DEPARTMENT_OTHER): Payer: 59 | Admitting: Cardiovascular Disease

## 2022-02-16 ENCOUNTER — Encounter (HOSPITAL_BASED_OUTPATIENT_CLINIC_OR_DEPARTMENT_OTHER): Payer: Self-pay

## 2022-02-16 ENCOUNTER — Encounter (HOSPITAL_BASED_OUTPATIENT_CLINIC_OR_DEPARTMENT_OTHER): Payer: Self-pay | Admitting: Cardiovascular Disease

## 2022-02-23 ENCOUNTER — Ambulatory Visit: Payer: HMO | Admitting: Internal Medicine

## 2022-02-24 ENCOUNTER — Other Ambulatory Visit (HOSPITAL_BASED_OUTPATIENT_CLINIC_OR_DEPARTMENT_OTHER): Payer: Self-pay | Admitting: Nurse Practitioner

## 2022-02-24 DIAGNOSIS — R519 Headache, unspecified: Secondary | ICD-10-CM

## 2022-02-26 ENCOUNTER — Other Ambulatory Visit (HOSPITAL_BASED_OUTPATIENT_CLINIC_OR_DEPARTMENT_OTHER): Payer: Self-pay

## 2022-02-26 ENCOUNTER — Ambulatory Visit
Admission: EM | Admit: 2022-02-26 | Discharge: 2022-02-26 | Disposition: A | Discharge status: Home / Self Care | Payer: HMO | Attending: Physician Assistant | Admitting: Physician Assistant

## 2022-02-26 DIAGNOSIS — L02811 Cutaneous abscess of head [any part, except face]: Secondary | ICD-10-CM | POA: Diagnosis not present

## 2022-02-26 DIAGNOSIS — R519 Headache, unspecified: Secondary | ICD-10-CM

## 2022-02-26 DIAGNOSIS — L03811 Cellulitis of head [any part, except face]: Secondary | ICD-10-CM

## 2022-02-26 MED ORDER — DOXYCYCLINE HYCLATE 100 MG PO CAPS: 100.0000 mg | ORAL_CAPSULE | Freq: Two times a day (BID) | ORAL | 0 refills | Status: DC

## 2022-02-26 MED ORDER — NORTRIPTYLINE HCL 25 MG PO CAPS: ORAL_CAPSULE | ORAL | 0 refills | Status: DC

## 2022-02-26 MED ORDER — ACETAMINOPHEN-CODEINE #3 300-30 MG PO TABS: 1.0000 | ORAL_TABLET | Freq: Three times a day (TID) | ORAL | 0 refills | Status: DC | PRN

## 2022-02-26 NOTE — ED Triage Notes (Signed)
Pt said place on the right side of her head on the top has been sore and getting bigger x 1 week. Will not pop. Very tender to touch.  ?
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
## 2022-02-26 NOTE — Discharge Instructions (Signed)
Go to the ED if no improvement or if pain worsens. ?

## 2022-03-09 ENCOUNTER — Other Ambulatory Visit (HOSPITAL_BASED_OUTPATIENT_CLINIC_OR_DEPARTMENT_OTHER): Payer: Self-pay | Admitting: Nurse Practitioner

## 2022-03-09 ENCOUNTER — Encounter (HOSPITAL_BASED_OUTPATIENT_CLINIC_OR_DEPARTMENT_OTHER): Payer: Self-pay | Admitting: Nurse Practitioner

## 2022-03-09 ENCOUNTER — Other Ambulatory Visit: Payer: Self-pay | Admitting: Family

## 2022-03-09 ENCOUNTER — Ambulatory Visit (INDEPENDENT_AMBULATORY_CARE_PROVIDER_SITE_OTHER): Payer: HMO | Admitting: Nurse Practitioner

## 2022-03-09 VITALS — BP 122/82 | HR 61 | Ht 66.0 in | Wt 256.4 lb

## 2022-03-09 DIAGNOSIS — I129 Hypertensive chronic kidney disease with stage 1 through stage 4 chronic kidney disease, or unspecified chronic kidney disease: Secondary | ICD-10-CM

## 2022-03-09 DIAGNOSIS — G4700 Insomnia, unspecified: Secondary | ICD-10-CM

## 2022-03-09 DIAGNOSIS — L0291 Cutaneous abscess, unspecified: Secondary | ICD-10-CM | POA: Diagnosis not present

## 2022-03-09 MED ORDER — SULFAMETHOXAZOLE-TRIMETHOPRIM 800-160 MG PO TABS: 1.0000 | ORAL_TABLET | Freq: Two times a day (BID) | ORAL | 0 refills | Status: DC

## 2022-03-09 MED ORDER — MUPIROCIN 2 % EX OINT: TOPICAL_OINTMENT | CUTANEOUS | 3 refills | Status: DC

## 2022-03-09 MED ORDER — METOPROLOL SUCCINATE ER 200 MG PO TB24: 200.0000 mg | ORAL_TABLET | Freq: Every day | ORAL | 0 refills | Status: DC

## 2022-03-09 MED ORDER — ACETAMINOPHEN-CODEINE #3 300-30 MG PO TABS: 1.0000 | ORAL_TABLET | Freq: Three times a day (TID) | ORAL | 0 refills | Status: DC | PRN

## 2022-03-09 MED ORDER — LOSARTAN POTASSIUM-HCTZ 50-12.5 MG PO TABS: 1.0000 | ORAL_TABLET | Freq: Every day | ORAL | 0 refills | Status: DC

## 2022-03-09 NOTE — Patient Instructions (Signed)
Pull about 1/2 inch from the wound daily and cut the excess. Use the ointment three times a day. Start the new antibiotic. I have also sent in pain medication for you. Let me know if it looks any worse at all.  ?

## 2022-03-14 ENCOUNTER — Ambulatory Visit: Payer: HMO | Admitting: Pulmonary Disease

## 2022-03-15 ENCOUNTER — Encounter (HOSPITAL_BASED_OUTPATIENT_CLINIC_OR_DEPARTMENT_OTHER): Payer: Self-pay | Admitting: Nurse Practitioner

## 2022-03-15 ENCOUNTER — Other Ambulatory Visit (HOSPITAL_BASED_OUTPATIENT_CLINIC_OR_DEPARTMENT_OTHER): Payer: Self-pay

## 2022-03-15 ENCOUNTER — Ambulatory Visit (INDEPENDENT_AMBULATORY_CARE_PROVIDER_SITE_OTHER): Payer: HMO | Admitting: Nurse Practitioner

## 2022-03-15 VITALS — BP 117/80 | HR 69 | Ht 66.0 in | Wt 256.0 lb

## 2022-03-15 DIAGNOSIS — E1122 Type 2 diabetes mellitus with diabetic chronic kidney disease: Secondary | ICD-10-CM | POA: Diagnosis not present

## 2022-03-15 DIAGNOSIS — E1149 Type 2 diabetes mellitus with other diabetic neurological complication: Secondary | ICD-10-CM

## 2022-03-15 DIAGNOSIS — Z794 Long term (current) use of insulin: Secondary | ICD-10-CM

## 2022-03-15 DIAGNOSIS — L0291 Cutaneous abscess, unspecified: Secondary | ICD-10-CM | POA: Diagnosis not present

## 2022-03-15 DIAGNOSIS — N183 Chronic kidney disease, stage 3 unspecified: Secondary | ICD-10-CM

## 2022-03-15 DIAGNOSIS — Z6838 Body mass index (BMI) 38.0-38.9, adult: Secondary | ICD-10-CM

## 2022-03-15 DIAGNOSIS — E1165 Type 2 diabetes mellitus with hyperglycemia: Secondary | ICD-10-CM | POA: Diagnosis not present

## 2022-03-15 MED ORDER — OZEMPIC (0.25 OR 0.5 MG/DOSE) 2 MG/1.5ML ~~LOC~~ SOPN: 0.5000 mg | PEN_INJECTOR | SUBCUTANEOUS | 0 refills | Status: DC

## 2022-03-16 ENCOUNTER — Ambulatory Visit (INDEPENDENT_AMBULATORY_CARE_PROVIDER_SITE_OTHER): Payer: HMO | Admitting: Internal Medicine

## 2022-03-16 ENCOUNTER — Other Ambulatory Visit (HOSPITAL_COMMUNITY): Payer: Self-pay

## 2022-03-16 ENCOUNTER — Encounter: Payer: Self-pay | Admitting: Internal Medicine

## 2022-03-16 ENCOUNTER — Other Ambulatory Visit: Payer: Self-pay | Admitting: Internal Medicine

## 2022-03-16 ENCOUNTER — Encounter (HOSPITAL_BASED_OUTPATIENT_CLINIC_OR_DEPARTMENT_OTHER): Payer: Self-pay | Admitting: Nurse Practitioner

## 2022-03-16 VITALS — BP 110/78 | HR 68 | Ht 66.0 in | Wt 256.0 lb

## 2022-03-16 DIAGNOSIS — K8500 Idiopathic acute pancreatitis without necrosis or infection: Secondary | ICD-10-CM

## 2022-03-16 DIAGNOSIS — E1149 Type 2 diabetes mellitus with other diabetic neurological complication: Secondary | ICD-10-CM

## 2022-03-16 DIAGNOSIS — E1165 Type 2 diabetes mellitus with hyperglycemia: Secondary | ICD-10-CM | POA: Diagnosis not present

## 2022-03-16 DIAGNOSIS — Z794 Long term (current) use of insulin: Secondary | ICD-10-CM

## 2022-03-16 DIAGNOSIS — Z8719 Personal history of other diseases of the digestive system: Secondary | ICD-10-CM | POA: Insufficient documentation

## 2022-03-16 DIAGNOSIS — K859 Acute pancreatitis without necrosis or infection, unspecified: Secondary | ICD-10-CM | POA: Insufficient documentation

## 2022-03-16 LAB — POCT GLYCOSYLATED HEMOGLOBIN (HGB A1C): Hemoglobin A1C: 8.1 % — AB (ref 4.0–5.6)

## 2022-03-16 LAB — LIPID PANEL
Cholesterol: 166 mg/dL (ref 0–200)
HDL: 57.5 mg/dL (ref >39.00)
LDL Cholesterol: 90 mg/dL (ref 0–99)
NonHDL: 108.55
Total CHOL/HDL Ratio: 3
Triglycerides: 92 mg/dL (ref 0.0–149.0)
VLDL: 18.4 mg/dL (ref 0.0–40.0)

## 2022-03-16 LAB — BASIC METABOLIC PANEL
BUN: 30 mg/dL — ABNORMAL HIGH (ref 6–23)
CO2: 26 mEq/L (ref 19–32)
Calcium: 9.8 mg/dL (ref 8.4–10.5)
Chloride: 99 mEq/L (ref 96–112)
Creatinine, Ser: 1.82 mg/dL — ABNORMAL HIGH (ref 0.40–1.20)
GFR: 28.57 mL/min — ABNORMAL LOW (ref >60.00)
Glucose, Bld: 161 mg/dL — ABNORMAL HIGH (ref 70–99)
Potassium: 4.5 mEq/L (ref 3.5–5.1)
Sodium: 134 mEq/L — ABNORMAL LOW (ref 135–145)

## 2022-03-16 LAB — MICROALBUMIN / CREATININE URINE RATIO
Creatinine,U: 68.3 mg/dL
Microalb Creat Ratio: 8.2 mg/g (ref 0.0–30.0)
Microalb, Ur: 5.6 mg/dL — ABNORMAL HIGH (ref 0.0–1.9)

## 2022-03-16 MED ORDER — PEN NEEDLES 32G X 6 MM MISC: 1.0000 | Freq: Four times a day (QID) | 3 refills | Status: DC

## 2022-03-16 MED ORDER — DAPAGLIFLOZIN PROPANEDIOL 10 MG PO TABS: 10.0000 mg | ORAL_TABLET | Freq: Every day | ORAL | 3 refills | Status: DC

## 2022-03-16 MED ORDER — BASAGLAR KWIKPEN 100 UNIT/ML ~~LOC~~ SOPN: 26.0000 [IU] | PEN_INJECTOR | Freq: Every day | SUBCUTANEOUS | 3 refills | Status: DC

## 2022-03-16 MED ORDER — NOVOLOG FLEXPEN 100 UNIT/ML ~~LOC~~ SOPN: PEN_INJECTOR | SUBCUTANEOUS | 3 refills | Status: DC

## 2022-03-16 MED ORDER — DEXCOM G7 SENSOR MISC
1.0000 | 3 refills | Status: DC
Start: 2022-03-16 — End: 2023-03-27

## 2022-03-16 NOTE — Telephone Encounter (Signed)
Novolog does not need a PA. Last filled on 3.29.23 for 3 month supply. Next earliest fill is 6.5.23. Pt insurance will cover Lantus Solostar in place of Basaglar with a $0 copay. Please advise and send in new script for Lantus Solostar.

## 2022-03-16 NOTE — Telephone Encounter (Signed)
Please send in new script for Lantus Solostar

## 2022-03-16 NOTE — Patient Instructions (Addendum)
-   Continue Farxiga 10 mg daily  ?-INcrease Basaglar to 26 units daily  ?- Novolog 8 units with each meal  ?- Novolog correctional insulin: ADD extra units on insulin to your meal-time Novolog dose if your blood sugars are higher than 160. Use the scale below to help guide you:  ? ?Blood sugar before meal Number of units to inject  ?Less than 160 0 unit  ?161 -  190 1 units  ?191 -  220 2 units  ?221 -  250 3 units  ?251 -  280 4 units  ?281 -  310 5 units  ?311 -  340 6 units  ?341 -  370 7 units  ? ? ? ?HOW TO TREAT LOW BLOOD SUGARS (Blood sugar LESS THAN 70 MG/DL) ?Please follow the RULE OF 15 for the treatment of hypoglycemia treatment (when your (blood sugars are less than 70 mg/dL)  ? ?STEP 1: Take 15 grams of carbohydrates when your blood sugar is low, which includes:  ?3-4 GLUCOSE TABS  OR ?3-4 OZ OF JUICE OR REGULAR SODA OR ?ONE TUBE OF GLUCOSE GEL   ? ?STEP 2: RECHECK blood sugar in 15 MINUTES ?STEP 3: If your blood sugar is still low at the 15 minute recheck --> then, go back to STEP 1 and treat AGAIN with another 15 grams of carbohydrates. ? ?
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
## 2022-03-17 ENCOUNTER — Other Ambulatory Visit: Payer: Self-pay | Admitting: Internal Medicine

## 2022-03-17 ENCOUNTER — Other Ambulatory Visit (HOSPITAL_COMMUNITY): Payer: Self-pay

## 2022-03-17 MED ORDER — LANTUS SOLOSTAR 100 UNIT/ML ~~LOC~~ SOPN: 26.0000 [IU] | PEN_INJECTOR | Freq: Every day | SUBCUTANEOUS | 3 refills | Status: DC

## 2022-03-17 NOTE — Telephone Encounter (Signed)
PA not needed. Pharmacy was processing under different insurance. I have called and they reprocessed under [employer] with a $0 copay

## 2022-03-18 ENCOUNTER — Encounter (HOSPITAL_BASED_OUTPATIENT_CLINIC_OR_DEPARTMENT_OTHER): Payer: Self-pay | Admitting: Nurse Practitioner

## 2022-03-18 DIAGNOSIS — L0291 Cutaneous abscess, unspecified: Secondary | ICD-10-CM

## 2022-03-18 HISTORY — DX: Cutaneous abscess, unspecified: L02.91

## 2022-03-18 NOTE — Progress Notes (Signed)
?Orma Render, DNP, AGNP-c ?[facility] ?[address] [place], [place] [address] ?[phone] ? ?Subjective:  ? ?Cynthia [name] is a 66 y.o. female presents to day for abscess on the top of the head. ? ?Abscess ?Previously was seen in urgent care for same concern and started on antibiotics. ?Area was previously reported as covering approximately 12 to 14 cm area with rapid enlargement ?Urgent care unable to drain the area initially ?She tells me that the swelling has gone down substantially since being started on the antibiotics originally however the area has started to drain on its own and remains quite tender and warm ?She is concerned for possible continuation of infection as she is now out of antibiotics ?She denies fever, chills, nausea, vomiting, diarrhea, headache, dizziness, night sweats ? ? ?PMH, Medications, and Allergies reviewed and updated in chart.  ? ? ?Objective:  ?BP 122/82   Pulse 61   Ht 5\' 6"  (1.676 m)   Wt 256 lb 6.4 oz (116.3 kg)   SpO2 97%   BMI 41.38 kg/m?  ?ROS negative except for what is listed in HPI. ?Physical Exam ?Vitals and nursing note reviewed. Exam conducted with a chaperone present.  ?Constitutional:   ?   Appearance: Normal appearance.  ?HENT:  ?   Head:  ? ?Eyes:  ?   Extraocular Movements: Extraocular movements intact.  ?   Conjunctiva/sclera: Conjunctivae normal.  ?   Pupils: Pupils are equal, round, and reactive to light.  ?Cardiovascular:  ?   Rate and Rhythm: Normal rate and regular rhythm.  ?   Pulses: Normal pulses.  ?   Heart sounds: Normal heart sounds.  ?Skin: ?   General: Skin is warm and dry.  ?   Capillary Refill: Capillary refill takes less than 2 seconds.  ?   Findings: Lesion present.  ?Neurological:  ?   General: No focal deficit present.  ?   Mental Status: She is alert and oriented to person, place, and time.  ?   Cranial Nerves: No cranial nerve deficit.  ?   Sensory: No sensory deficit.  ?    Motor: No weakness.  ?Psychiatric:     ?   Mood and Affect: Mood normal.     ?   Behavior: Behavior normal.     ?   Thought Content: Thought content normal.     ?   Judgment: Judgment normal.  ? ? ?    ? ?Assessment & Plan:  ? ?Abscess ?Symptoms and presentation consistent with infectious abscess present to the scalp.  Improvement evident from oral antibiotics however there is still significant amount of infection present and antibiotics have been completed.  Discussed recommendations with patient of incision and drainage today with start of additional antibiotic treatment for further therapy to aid in resolution.  Verbal consent provided for incision and drainage. ? ?Diagnosis: abscess - Location: right, superior sclap ?Procedure: Incision & drainage ?Informed consent:  Discussed risks (scarring, permanent hair loss, infection, pain, bleeding, bruising, numbness, and recurrence of the condition) and benefits of the procedure, as well as the alternatives.  Informed consent was obtained. ?Anesthesia: 1% lidocaine with epi and sodium bicarb buffer 3:1 ratio ?Type: infiltration of surrounding tissue ?The area was prepared and draped in a standard fashion. ?The lesion drained thin, mucoid fluid present. Marland Kitchen ?A large amount of fluid was drained. ?Pieces of cyst wall were extracted. ?Due to its large size, the cavity was packed  with iodoform gauze. ?Antibiotic ointment and a sterile pressure dressing were applied. ?The patient tolerated the procedure well. ?The patient was instructed on post-op care. ?Patient instructed to remove approximate 1/2 inch of iodoform gauze every other day and keep area covered. Avoid water with exception of showering.  ?Return in 7-8 days for repeat evaluation and recommendations.  ? ? ?History and Medication reviewed and updated this encounter.  ? ?Orma Render, DNP, AGNP-c ?03/18/2022  9:09 PM   ? ?

## 2022-03-18 NOTE — Assessment & Plan Note (Signed)
Symptoms and presentation consistent with infectious abscess present to the scalp.  Improvement evident from oral antibiotics however there is still significant amount of infection present and antibiotics have been completed.  Discussed recommendations with patient of incision and drainage today with start of additional antibiotic treatment for further therapy to aid in resolution.  Verbal consent provided for incision and drainage. ? ?Diagnosis: abscess - Location: right, superior sclap ?Procedure: Incision & drainage ?Informed consent:  Discussed risks (scarring, permanent hair loss, infection, pain, bleeding, bruising, numbness, and recurrence of the condition) and benefits of the procedure, as well as the alternatives.  Informed consent was obtained. ?Anesthesia: 1% lidocaine with epi and sodium bicarb buffer 3:1 ratio ?Type: infiltration of surrounding tissue ?The area was prepared and draped in a standard fashion. ?The lesion drained thin, mucoid fluid present. Marland Kitchen ?A large amount of fluid was drained. ?Pieces of cyst wall were extracted. ?Due to its large size, the cavity was packed with iodoform gauze. ?Antibiotic ointment and a sterile pressure dressing were applied. ?The patient tolerated the procedure well. ?The patient was instructed on post-op care. ?Patient instructed to remove approximate 1/2 inch of iodoform gauze every other day and keep area covered. Avoid water with exception of showering.  ?Return in 7-8 days for repeat evaluation and recommendations.  ? ?

## 2022-03-20 ENCOUNTER — Telehealth (HOSPITAL_BASED_OUTPATIENT_CLINIC_OR_DEPARTMENT_OTHER): Payer: Self-pay

## 2022-03-22 ENCOUNTER — Encounter (HOSPITAL_BASED_OUTPATIENT_CLINIC_OR_DEPARTMENT_OTHER): Payer: Self-pay | Admitting: Nurse Practitioner

## 2022-03-22 ENCOUNTER — Other Ambulatory Visit (HOSPITAL_BASED_OUTPATIENT_CLINIC_OR_DEPARTMENT_OTHER): Payer: Self-pay | Admitting: Nurse Practitioner

## 2022-03-22 DIAGNOSIS — G4700 Insomnia, unspecified: Secondary | ICD-10-CM

## 2022-03-22 MED ORDER — HYDROXYZINE PAMOATE 25 MG PO CAPS: 25.0000 mg | ORAL_CAPSULE | Freq: Every evening | ORAL | 0 refills | Status: DC | PRN

## 2022-03-23 ENCOUNTER — Other Ambulatory Visit (HOSPITAL_BASED_OUTPATIENT_CLINIC_OR_DEPARTMENT_OTHER): Payer: Self-pay

## 2022-03-23 NOTE — Assessment & Plan Note (Signed)
Well healing abscess to scalp with no signs of further infectious process present at this time. Recommend continue to monitor closely and avoid scratching or picking at the scab to prevent possible reinfection of the incision. No need for closure today as it is healing well on its own.  ?Recommend f/u if symptoms worsen or new sx appear.  ?

## 2022-03-23 NOTE — Assessment & Plan Note (Signed)
Discussion of medication management with patient briefly during visit today. Will review records to see if Ozempic is appropriate for management of her diabetes and , if so, will plan to send that to the pharmacy for her to try. I feel that her weight loss difficulties are due to increased insulin use, so this transition may help better control BG and weight.  ?She is using a CGM, which is helpful. We will make changes once chart has been reviewed, if appropriate ?

## 2022-03-23 NOTE — Progress Notes (Signed)
?Worthy Keeler, DNP, AGNP-c ?[facility] ?[address] ?[address] ?[place], [place] [address] ?[phone] Office [phone] Fax ? ?ESTABLISHED PATIENT- Chronic Health and/or Follow-Up Visit ? ?Blood pressure 117/80, pulse 69, height 5\' 6"  (1.676 m), weight 256 lb (116.1 kg), SpO2 99 %. ? ?No chief complaint on file. ? ? ?HPI ? ?Cynthia [name]  is a 66 y.o. year old female presenting today for evaluation and management of the following: ?Abscess ?Recheck of abscess on head today ?No new pain, swelling, redness, drainage ?Complete removal of iodoform gauze ?Has been keeping area clean and covered ?Would like to know when she can return to the pool ?DM ?Would like to discuss ozempic for diabetes management  ?Management has been difficult ?Weight loss impossible ?Dealing with cravings, increased hunger, and concerns about infections with recent abscess ? ?ROS ?All ROS negative with exception of what is listed in HPI ? ?PHYSICAL EXAM ?Physical Exam ?Vitals and nursing note reviewed.  ?Constitutional:   ?   Appearance: Normal appearance. She is obese.  ?HENT:  ?   Head:  ? ?Eyes:  ?   Extraocular Movements: Extraocular movements intact.  ?   Conjunctiva/sclera: Conjunctivae normal.  ?   Pupils: Pupils are equal, round, and reactive to light.  ?Neck:  ?   Vascular: No carotid bruit.  ?Cardiovascular:  ?   Rate and Rhythm: Normal rate and regular rhythm.  ?   Pulses: Normal pulses.  ?   Heart sounds: Normal heart sounds.  ?Pulmonary:  ?   Effort: Pulmonary effort is normal.  ?   Breath sounds: Normal breath sounds.  ?Abdominal:  ?   General: Bowel sounds are normal.  ?   Palpations: Abdomen is soft.  ?Musculoskeletal:     ?   General: Normal range of motion.  ?   Cervical back: Normal range of motion. No tenderness.  ?Lymphadenopathy:  ?   Cervical: No cervical adenopathy.  ?Skin: ?   General: Skin is warm and dry.  ?   Capillary Refill: Capillary refill takes less than 2  seconds.  ?Neurological:  ?   General: No focal deficit present.  ?   Mental Status: She is alert and oriented to person, place, and time.  ?   Cranial Nerves: No cranial nerve deficit.  ?Psychiatric:     ?   Mood and Affect: Mood normal.     ?   Behavior: Behavior normal.     ?   Thought Content: Thought content normal.     ?   Judgment: Judgment normal.  ? ? ?ASSESSMENT & PLAN ?Problem List Items Addressed This Visit   ? ? Type 2 diabetes mellitus with stage 3 chronic kidney disease, with long-term current use of insulin ([place])  ?  Discussion of medication management with patient briefly during visit today. Will review records to see if Ozempic is appropriate for management of her diabetes and , if so, will plan to send that to the pharmacy for her to try. I feel that her weight loss difficulties are due to increased insulin use, so this transition may help better control BG and weight.  ?She is using a CGM, which is helpful. We will make changes once chart has been reviewed, if appropriate ? ?  ?  ? Abscess - Primary  ?  Well healing abscess to scalp with no signs of further infectious process present at this time. Recommend continue to monitor closely and avoid scratching or picking  at the scab to prevent possible reinfection of the incision. No need for closure today as it is healing well on its own.  ?Recommend f/u if symptoms worsen or new sx appear.  ? ?  ?  ? Class 2 severe obesity with serious comorbidity and body mass index (BMI) of 38.0 to 38.9 in adult, unspecified obesity type ([place])  ? ?Other Visit Diagnoses   ? ? Type 2 diabetes mellitus with hyperglycemia, with long-term current use of insulin ([place])      ? ?  ? ? ? ?FOLLOW-UP ?No follow-ups on file. ? ?Worthy Keeler, DNP, AGNP-c ?03/15/2022  9:35 AM ?

## 2022-03-27 ENCOUNTER — Other Ambulatory Visit (HOSPITAL_BASED_OUTPATIENT_CLINIC_OR_DEPARTMENT_OTHER): Payer: Self-pay

## 2022-03-27 ENCOUNTER — Other Ambulatory Visit (HOSPITAL_BASED_OUTPATIENT_CLINIC_OR_DEPARTMENT_OTHER): Payer: Self-pay | Admitting: Nurse Practitioner

## 2022-03-27 DIAGNOSIS — M797 Fibromyalgia: Secondary | ICD-10-CM

## 2022-03-27 DIAGNOSIS — E1149 Type 2 diabetes mellitus with other diabetic neurological complication: Secondary | ICD-10-CM

## 2022-03-27 MED ORDER — PEN NEEDLES 32G X 6 MM MISC: 1.0000 | Freq: Four times a day (QID) | 3 refills | Status: DC

## 2022-03-27 MED ORDER — TIZANIDINE HCL 4 MG PO CAPS: 4.0000 mg | ORAL_CAPSULE | Freq: Three times a day (TID) | ORAL | 1 refills | Status: DC | PRN

## 2022-04-02 ENCOUNTER — Other Ambulatory Visit (HOSPITAL_BASED_OUTPATIENT_CLINIC_OR_DEPARTMENT_OTHER): Payer: Self-pay | Admitting: Nurse Practitioner

## 2022-04-02 DIAGNOSIS — R519 Headache, unspecified: Secondary | ICD-10-CM

## 2022-04-03 ENCOUNTER — Encounter (HOSPITAL_BASED_OUTPATIENT_CLINIC_OR_DEPARTMENT_OTHER): Payer: Self-pay | Admitting: Nurse Practitioner

## 2022-04-03 ENCOUNTER — Other Ambulatory Visit (HOSPITAL_BASED_OUTPATIENT_CLINIC_OR_DEPARTMENT_OTHER): Payer: Self-pay

## 2022-04-03 DIAGNOSIS — M797 Fibromyalgia: Secondary | ICD-10-CM

## 2022-04-03 MED ORDER — TIZANIDINE HCL 4 MG PO CAPS: 4.0000 mg | ORAL_CAPSULE | Freq: Three times a day (TID) | ORAL | 1 refills | Status: DC | PRN

## 2022-04-12 ENCOUNTER — Telehealth (HOSPITAL_BASED_OUTPATIENT_CLINIC_OR_DEPARTMENT_OTHER): Payer: Self-pay | Admitting: Nurse Practitioner

## 2022-04-12 ENCOUNTER — Telehealth (HOSPITAL_BASED_OUTPATIENT_CLINIC_OR_DEPARTMENT_OTHER): Payer: Self-pay

## 2022-04-12 NOTE — Telephone Encounter (Signed)
Received fax transmission from pt's ins company approving coverage for Ozempic (semaglutide). Documents will be in provider's yellow dot tray.

## 2022-04-12 NOTE — Telephone Encounter (Signed)
Received fax from [facility] patient was approved for Ozempic.  [employer]

## 2022-04-26 ENCOUNTER — Other Ambulatory Visit: Payer: Self-pay | Admitting: Internal Medicine

## 2022-04-26 DIAGNOSIS — E1149 Type 2 diabetes mellitus with other diabetic neurological complication: Secondary | ICD-10-CM

## 2022-04-27 ENCOUNTER — Other Ambulatory Visit (HOSPITAL_BASED_OUTPATIENT_CLINIC_OR_DEPARTMENT_OTHER): Payer: Self-pay | Admitting: Nurse Practitioner

## 2022-04-27 DIAGNOSIS — M797 Fibromyalgia: Secondary | ICD-10-CM

## 2022-05-02 ENCOUNTER — Ambulatory Visit: Payer: HMO | Admitting: Pulmonary Disease

## 2022-05-09 ENCOUNTER — Encounter (HOSPITAL_BASED_OUTPATIENT_CLINIC_OR_DEPARTMENT_OTHER): Payer: Self-pay | Admitting: *Deleted

## 2022-05-09 ENCOUNTER — Ambulatory Visit (INDEPENDENT_AMBULATORY_CARE_PROVIDER_SITE_OTHER): Payer: HMO | Admitting: Nurse Practitioner

## 2022-05-09 ENCOUNTER — Encounter (HOSPITAL_BASED_OUTPATIENT_CLINIC_OR_DEPARTMENT_OTHER): Payer: Self-pay | Admitting: Nurse Practitioner

## 2022-05-09 VITALS — BP 117/72 | HR 78 | Ht 66.0 in | Wt 247.0 lb

## 2022-05-09 DIAGNOSIS — N3090 Cystitis, unspecified without hematuria: Secondary | ICD-10-CM | POA: Diagnosis not present

## 2022-05-09 DIAGNOSIS — G4700 Insomnia, unspecified: Secondary | ICD-10-CM | POA: Diagnosis not present

## 2022-05-09 DIAGNOSIS — N941 Unspecified dyspareunia: Secondary | ICD-10-CM

## 2022-05-09 DIAGNOSIS — E559 Vitamin D deficiency, unspecified: Secondary | ICD-10-CM | POA: Diagnosis not present

## 2022-05-09 DIAGNOSIS — M79604 Pain in right leg: Secondary | ICD-10-CM

## 2022-05-09 DIAGNOSIS — M79605 Pain in left leg: Secondary | ICD-10-CM

## 2022-05-09 MED ORDER — PHENAZOPYRIDINE HCL 100 MG PO TABS: 100.0000 mg | ORAL_TABLET | Freq: Three times a day (TID) | ORAL | 6 refills | Status: DC | PRN

## 2022-05-09 MED ORDER — HYDROXYZINE PAMOATE 25 MG PO CAPS: 25.0000 mg | ORAL_CAPSULE | Freq: Every evening | ORAL | 3 refills | Status: DC | PRN

## 2022-05-09 MED ORDER — NITROFURANTOIN MONOHYD MACRO 100 MG PO CAPS: 100.0000 mg | ORAL_CAPSULE | Freq: Two times a day (BID) | ORAL | 0 refills | Status: DC

## 2022-05-09 NOTE — Progress Notes (Signed)
Tollie Eth, DNP, AGNP-c Primary Care & Sports Medicine [address]  [address] [place], [place] [address] [phone]  Subjective:   Cynthia [name] is a 66 y.o. female presents to day for evaluation and management of the following: Pelvic pain and dyspareunia The patient presents with complaints of pelvic pain and pressure during urination and bowel movements. She describes a tearing sensation in the vaginal introitus after intercourse, which occurs approximately every 2 weeks. There are no reports of back pain or blood in the urine. The patient denies any changes in bowel habits. Symptoms have been present for the past several days. She is sexually active with only one partner in a monogamous relationship and does not have concerns for sexually transmitted infections (STIs). There are no known fevers, chills, or confusion. Additionally, the patient is experiencing some weakness and heaviness in her legs.  PMH, Medications, and Allergies reviewed and updated in chart.   ROS negative except for what is listed in HPI. Objective:  BP 117/72   Pulse 78   Ht 5\' 6"  (1.676 m)   Wt 247 lb (112 kg)   SpO2 99%   BMI 39.87 kg/m  Physical Exam Vitals and nursing note reviewed.  Constitutional:      General: She is not in acute distress.    Appearance: Normal appearance.  HENT:     Head: Normocephalic.  Eyes:     Extraocular Movements: Extraocular movements intact.     Conjunctiva/sclera: Conjunctivae normal.     Pupils: Pupils are equal, round, and reactive to light.  Neck:     Vascular: No carotid bruit.  Cardiovascular:     Rate and Rhythm: Normal rate and regular rhythm.     Pulses: Normal pulses.     Heart sounds: Normal heart sounds. No murmur heard. Pulmonary:     Effort: Pulmonary effort is normal.     Breath sounds: Normal breath sounds. No wheezing.  Abdominal:     General: Bowel sounds are normal. There is no distension.     Palpations:  Abdomen is soft.     Tenderness: There is abdominal tenderness in the suprapubic area. There is no right CVA tenderness, left CVA tenderness or guarding.  Musculoskeletal:        General: Normal range of motion.     Cervical back: Normal range of motion and neck supple.     Right lower leg: No edema.     Left lower leg: No edema.  Lymphadenopathy:     Cervical: No cervical adenopathy.  Skin:    General: Skin is warm and dry.     Capillary Refill: Capillary refill takes less than 2 seconds.  Neurological:     General: No focal deficit present.     Mental Status: She is alert and oriented to person, place, and time.  Psychiatric:        Mood and Affect: Mood normal.        Behavior: Behavior normal.        Thought Content: Thought content normal.        Judgment: Judgment normal.           Assessment & Plan:   Problem List Items Addressed This Visit     Insomnia    Refill on hydroxyzine for sleep today.  This has been an effective treatment mechanism for the patient and has eliminated the need for temazepam, pregabalin, amitriptyline, and Benadryl.  We will continue to monitor closely.  Relevant Medications   hydrOXYzine (VISTARIL) 25 MG capsule   Cystitis - Primary    Urinalysis positive today for nitrates and blood.  We will begin treatment today for cystitis infection and send remainder of urine out for culture to ensure antibiotic reanalysis.  Pelvic examination and STI testing have been deferred today by patient.  Recommend increased water intake to ensure adequate hydration and flushing of the kidneys.  Recommend urination immediately before and immediately after any sexual intercourse to help rinse bacteria out of the urethra.  Encourage appropriate perineal hygiene with wiping from front to back to prevent contamination of bacteria into the urinary meatus. If symptoms continue despite treatment with antibiotic please contact the office for further recommendations.  We  will continue to monitor urine culture and change antibiotics as appropriate.      Relevant Medications   phenazopyridine (PYRIDIUM) 100 MG tablet   nitrofurantoin, macrocrystal-monohydrate, (MACROBID) 100 MG capsule   Leg pain, bilateral    Bilateral leg pain and heaviness in the setting of urinary tract infection.  Patient does have multiple chronic conditions that could be contributing however the onset is consistent with current UTI therefore I do believe that this is a side effect of the infection itself.  Encourage patient to increase water intake ensuring a minimum of 64 ounces water a day.  Elevate legs when seated to help facilitate blood return to the heart.  If symptoms continue patient will follow-up in the office.  We will obtain some labs today to ensure that there are no findings that could be contributing to the symptoms.      Relevant Orders   VITAMIN D 25 Hydroxy (Vit-D Deficiency, Fractures) (Completed)   B12 and Folate Panel (Completed)   Iron, TIBC and Ferritin Panel (Completed)   TSH (Completed)   Dyspareunia in female    Sensation of vaginal tearing following intercourse in postmenopausal female.  Symptoms are consistent with vaginal dryness associated with decreased estrogen levels.  Encouraged patient to utilize lubrication with each sexual encounter.  She may also consider use of Aquaphor topically to the vaginal opening and immediately inside the vagina on a daily or twice daily basis to help ensure appropriate moisture is maintained.  Patient expressed understanding of instructions.  If symptoms persist despite treatment she will follow-up.  May consider low-dose topical estrogen if this treatment is not effective.      Other Visit Diagnoses     Vitamin D deficiency disease       Relevant Medications   Vitamin D, Ergocalciferol, (DRISDOL) 1.25 MG (50000 UNIT) CAPS capsule        Tollie Eth, DNP, AGNP-c 05/24/2022  3:57 PM

## 2022-05-10 LAB — IRON,TIBC AND FERRITIN PANEL
Ferritin: 90 ng/mL (ref 15–150)
Iron Saturation: 22 % (ref 15–55)
Iron: 63 ug/dL (ref 27–139)
Total Iron Binding Capacity: 291 ug/dL (ref 250–450)
UIBC: 228 ug/dL (ref 118–369)

## 2022-05-10 LAB — B12 AND FOLATE PANEL
Folate: >20 ng/mL (ref >3.0)
Vitamin B-12: 723 pg/mL (ref 232–1245)

## 2022-05-10 LAB — VITAMIN D 25 HYDROXY (VIT D DEFICIENCY, FRACTURES): Vit D, 25-Hydroxy: 26.2 ng/mL — ABNORMAL LOW (ref 30.0–100.0)

## 2022-05-10 LAB — TSH: TSH: 1.9 u[IU]/mL (ref 0.450–4.500)

## 2022-05-12 ENCOUNTER — Encounter (HOSPITAL_BASED_OUTPATIENT_CLINIC_OR_DEPARTMENT_OTHER): Payer: Self-pay | Admitting: Nurse Practitioner

## 2022-05-12 MED ORDER — VITAMIN D (ERGOCALCIFEROL) 1.25 MG (50000 UNIT) PO CAPS: 50000.0000 [IU] | ORAL_CAPSULE | ORAL | 3 refills | Status: DC

## 2022-05-14 ENCOUNTER — Other Ambulatory Visit (HOSPITAL_BASED_OUTPATIENT_CLINIC_OR_DEPARTMENT_OTHER): Payer: Self-pay

## 2022-05-14 DIAGNOSIS — E1165 Type 2 diabetes mellitus with hyperglycemia: Secondary | ICD-10-CM

## 2022-05-14 MED ORDER — ACCU-CHEK GUIDE VI STRP: ORAL_STRIP | 2 refills | Status: DC

## 2022-05-15 ENCOUNTER — Encounter (HOSPITAL_BASED_OUTPATIENT_CLINIC_OR_DEPARTMENT_OTHER): Payer: Self-pay | Admitting: Nurse Practitioner

## 2022-05-15 ENCOUNTER — Other Ambulatory Visit (HOSPITAL_BASED_OUTPATIENT_CLINIC_OR_DEPARTMENT_OTHER): Payer: Self-pay

## 2022-05-15 DIAGNOSIS — I129 Hypertensive chronic kidney disease with stage 1 through stage 4 chronic kidney disease, or unspecified chronic kidney disease: Secondary | ICD-10-CM

## 2022-05-15 MED ORDER — AMLODIPINE BESYLATE 10 MG PO TABS: 10.0000 mg | ORAL_TABLET | Freq: Every day | ORAL | 0 refills | Status: DC

## 2022-05-17 ENCOUNTER — Other Ambulatory Visit (HOSPITAL_BASED_OUTPATIENT_CLINIC_OR_DEPARTMENT_OTHER): Payer: Self-pay

## 2022-05-17 ENCOUNTER — Encounter (HOSPITAL_BASED_OUTPATIENT_CLINIC_OR_DEPARTMENT_OTHER): Payer: Self-pay | Admitting: Nurse Practitioner

## 2022-05-17 DIAGNOSIS — E1149 Type 2 diabetes mellitus with other diabetic neurological complication: Secondary | ICD-10-CM

## 2022-05-17 DIAGNOSIS — E1165 Type 2 diabetes mellitus with hyperglycemia: Secondary | ICD-10-CM

## 2022-05-17 MED ORDER — MICROLET LANCETS MISC: 99 refills | Status: DC

## 2022-05-17 MED ORDER — BLOOD GLUCOSE MONITOR KIT: PACK | 99 refills | Status: DC

## 2022-05-17 MED ORDER — GLUCOSE BLOOD VI STRP: ORAL_STRIP | 12 refills | Status: DC

## 2022-05-17 MED ORDER — LOSARTAN POTASSIUM-HCTZ 50-12.5 MG PO TABS: 1.0000 | ORAL_TABLET | Freq: Every day | ORAL | 0 refills | Status: DC

## 2022-05-20 ENCOUNTER — Other Ambulatory Visit (HOSPITAL_BASED_OUTPATIENT_CLINIC_OR_DEPARTMENT_OTHER): Payer: Self-pay | Admitting: Nurse Practitioner

## 2022-05-20 DIAGNOSIS — E1165 Type 2 diabetes mellitus with hyperglycemia: Secondary | ICD-10-CM

## 2022-05-20 DIAGNOSIS — Z794 Long term (current) use of insulin: Secondary | ICD-10-CM

## 2022-05-22 ENCOUNTER — Other Ambulatory Visit (HOSPITAL_BASED_OUTPATIENT_CLINIC_OR_DEPARTMENT_OTHER): Payer: Self-pay | Admitting: Nurse Practitioner

## 2022-05-22 ENCOUNTER — Encounter (HOSPITAL_BASED_OUTPATIENT_CLINIC_OR_DEPARTMENT_OTHER): Payer: Self-pay | Admitting: Nurse Practitioner

## 2022-05-22 DIAGNOSIS — M797 Fibromyalgia: Secondary | ICD-10-CM

## 2022-05-23 MED ORDER — METOPROLOL SUCCINATE ER 200 MG PO TB24: 200.0000 mg | ORAL_TABLET | Freq: Every day | ORAL | 3 refills | Status: DC

## 2022-05-23 MED ORDER — MICROLET LANCETS MISC: 99 refills | Status: AC

## 2022-05-23 MED ORDER — GLUCOSE BLOOD VI STRP
ORAL_STRIP | 12 refills | Status: DC
Start: 2022-05-23 — End: 2023-05-15

## 2022-05-23 MED ORDER — BLOOD GLUCOSE MONITOR KIT: PACK | 99 refills | Status: DC

## 2022-05-23 MED ORDER — LOSARTAN POTASSIUM-HCTZ 50-12.5 MG PO TABS: 1.0000 | ORAL_TABLET | Freq: Every day | ORAL | 3 refills | Status: DC

## 2022-05-23 NOTE — Telephone Encounter (Signed)
Resent order for Losartan-HCTZ for 90 days, Metoprolol for 90 days, and Blood glucose meter, lancets, and test strips. Meter Rx was shown to have been sent 05/18/2022.  Pharmacy: [facility] on [address]

## 2022-05-24 DIAGNOSIS — N941 Unspecified dyspareunia: Secondary | ICD-10-CM | POA: Insufficient documentation

## 2022-05-24 DIAGNOSIS — N3090 Cystitis, unspecified without hematuria: Secondary | ICD-10-CM | POA: Insufficient documentation

## 2022-05-24 DIAGNOSIS — M79604 Pain in right leg: Secondary | ICD-10-CM | POA: Insufficient documentation

## 2022-05-24 NOTE — Assessment & Plan Note (Signed)
Refill on hydroxyzine for sleep today.  This has been an effective treatment mechanism for the patient and has eliminated the need for temazepam, pregabalin, amitriptyline, and Benadryl.  We will continue to monitor closely.

## 2022-05-24 NOTE — Assessment & Plan Note (Signed)
Urinalysis positive today for nitrates and blood.  We will begin treatment today for cystitis infection and send remainder of urine out for culture to ensure antibiotic reanalysis.  Pelvic examination and STI testing have been deferred today by patient.  Recommend increased water intake to ensure adequate hydration and flushing of the kidneys.  Recommend urination immediately before and immediately after any sexual intercourse to help rinse bacteria out of the urethra.  Encourage appropriate perineal hygiene with wiping from front to back to prevent contamination of bacteria into the urinary meatus. If symptoms continue despite treatment with antibiotic please contact the office for further recommendations.  We will continue to monitor urine culture and change antibiotics as appropriate.

## 2022-05-24 NOTE — Assessment & Plan Note (Signed)
Bilateral leg pain and heaviness in the setting of urinary tract infection.  Patient does have multiple chronic conditions that could be contributing however the onset is consistent with current UTI therefore I do believe that this is a side effect of the infection itself.  Encourage patient to increase water intake ensuring a minimum of 64 ounces water a day.  Elevate legs when seated to help facilitate blood return to the heart.  If symptoms continue patient will follow-up in the office.  We will obtain some labs today to ensure that there are no findings that could be contributing to the symptoms.

## 2022-05-24 NOTE — Assessment & Plan Note (Signed)
Sensation of vaginal tearing following intercourse in postmenopausal female.  Symptoms are consistent with vaginal dryness associated with decreased estrogen levels.  Encouraged patient to utilize lubrication with each sexual encounter.  She may also consider use of Aquaphor topically to the vaginal opening and immediately inside the vagina on a daily or twice daily basis to help ensure appropriate moisture is maintained.  Patient expressed understanding of instructions.  If symptoms persist despite treatment she will follow-up.  May consider low-dose topical estrogen if this treatment is not effective.

## 2022-05-31 MED ORDER — LOSARTAN POTASSIUM-HCTZ 50-12.5 MG PO TABS: 1.0000 | ORAL_TABLET | Freq: Every day | ORAL | 3 refills | Status: DC

## 2022-05-31 NOTE — Addendum Note (Signed)
Addended by: Derrious Bologna, Huntley Dec E on: 05/31/2022 09:28 PM   Modules accepted: Orders

## 2022-06-01 ENCOUNTER — Encounter (HOSPITAL_BASED_OUTPATIENT_CLINIC_OR_DEPARTMENT_OTHER): Payer: Self-pay | Admitting: Nurse Practitioner

## 2022-06-07 ENCOUNTER — Encounter (HOSPITAL_BASED_OUTPATIENT_CLINIC_OR_DEPARTMENT_OTHER): Payer: Self-pay | Admitting: Family Medicine

## 2022-06-07 ENCOUNTER — Ambulatory Visit (INDEPENDENT_AMBULATORY_CARE_PROVIDER_SITE_OTHER): Payer: PPO | Admitting: Family Medicine

## 2022-06-07 DIAGNOSIS — J310 Chronic rhinitis: Secondary | ICD-10-CM | POA: Diagnosis not present

## 2022-06-07 HISTORY — DX: Chronic rhinitis: J31.0

## 2022-06-07 MED ORDER — FLUTICASONE PROPIONATE 50 MCG/ACT NA SUSP: 2.0000 | Freq: Every day | NASAL | 1 refills | Status: DC

## 2022-06-07 NOTE — Telephone Encounter (Signed)
Called pt and scheduled for today 06/07/22 to be seen with dr. De [place] since Sb is out of the office
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
## 2022-06-07 NOTE — Patient Instructions (Signed)
  Medication Instructions:  Your physician recommends that you continue on your current medications as directed. Please refer to the Current Medication list given to you today. --If you need a refill on any your medications before your next appointment, please call your pharmacy first. If no refills are authorized on file call the office.-- Lab Work: Your physician has recommended that you have lab work today: No If you have labs (blood work) drawn today and your tests are completely normal, you will receive your results via MyChart message OR a phone call from our staff.  Please ensure you check your voicemail in the event that you authorized detailed messages to be left on a delegated number. If you have any lab test that is abnormal or we need to change your treatment, we will call you to review the results.  Referrals/Procedures/Imaging: No  Follow-Up: Your next appointment:   Your physician recommends that you schedule a follow-up appointment in: 4-6 weeks with Enid Skeens, NP.  You will receive a text message or e-mail with a link to a survey about your care and experience with [place] today! We would greatly appreciate your feedback!   Thanks for letting us be apart of your health journey!!  Primary Care and Sports Medicine   Dr. Ceasar Mons [place]   We encourage you to activate your patient portal called "MyChart".  Sign up information is provided on this After Visit Summary.  MyChart is used to connect with patients for Virtual Visits (Telemedicine).  Patients are able to view lab/test results, encounter notes, upcoming appointments, etc.  Non-urgent messages can be sent to your provider as well. To learn more about what you can do with MyChart, please visit --  [email or web address].

## 2022-06-21 ENCOUNTER — Other Ambulatory Visit (HOSPITAL_BASED_OUTPATIENT_CLINIC_OR_DEPARTMENT_OTHER): Payer: Self-pay | Admitting: Nurse Practitioner

## 2022-06-21 ENCOUNTER — Encounter (INDEPENDENT_AMBULATORY_CARE_PROVIDER_SITE_OTHER): Payer: Self-pay

## 2022-06-21 DIAGNOSIS — M797 Fibromyalgia: Secondary | ICD-10-CM

## 2022-07-05 ENCOUNTER — Other Ambulatory Visit (HOSPITAL_BASED_OUTPATIENT_CLINIC_OR_DEPARTMENT_OTHER): Payer: Self-pay | Admitting: Nurse Practitioner

## 2022-07-05 DIAGNOSIS — M797 Fibromyalgia: Secondary | ICD-10-CM

## 2022-07-06 ENCOUNTER — Ambulatory Visit (HOSPITAL_BASED_OUTPATIENT_CLINIC_OR_DEPARTMENT_OTHER): Payer: HMO | Admitting: Nurse Practitioner

## 2022-07-20 ENCOUNTER — Ambulatory Visit: Payer: HMO | Admitting: Internal Medicine

## 2022-07-25 ENCOUNTER — Other Ambulatory Visit (HOSPITAL_BASED_OUTPATIENT_CLINIC_OR_DEPARTMENT_OTHER): Payer: Self-pay | Admitting: Internal Medicine

## 2022-08-01 ENCOUNTER — Ambulatory Visit (INDEPENDENT_AMBULATORY_CARE_PROVIDER_SITE_OTHER): Payer: HMO | Admitting: Nurse Practitioner

## 2022-08-01 ENCOUNTER — Encounter (HOSPITAL_BASED_OUTPATIENT_CLINIC_OR_DEPARTMENT_OTHER): Payer: Self-pay | Admitting: Nurse Practitioner

## 2022-08-01 VITALS — BP 108/76 | HR 71 | Ht 66.0 in | Wt 243.2 lb

## 2022-08-01 DIAGNOSIS — G4700 Insomnia, unspecified: Secondary | ICD-10-CM

## 2022-08-01 DIAGNOSIS — M25511 Pain in right shoulder: Secondary | ICD-10-CM

## 2022-08-01 DIAGNOSIS — N6324 Unspecified lump in the left breast, lower inner quadrant: Secondary | ICD-10-CM

## 2022-08-01 DIAGNOSIS — T7840XA Allergy, unspecified, initial encounter: Secondary | ICD-10-CM | POA: Diagnosis not present

## 2022-08-01 DIAGNOSIS — R5383 Other fatigue: Secondary | ICD-10-CM

## 2022-08-01 DIAGNOSIS — M25512 Pain in left shoulder: Secondary | ICD-10-CM

## 2022-08-01 HISTORY — DX: Pain in right shoulder: M25.511

## 2022-08-01 HISTORY — DX: Unspecified lump in the left breast, lower inner quadrant: N63.24

## 2022-08-01 MED ORDER — AMITRIPTYLINE HCL 50 MG PO TABS: 50.0000 mg | ORAL_TABLET | Freq: Every evening | ORAL | 1 refills | Status: DC | PRN

## 2022-08-01 MED ORDER — LEVOCETIRIZINE DIHYDROCHLORIDE 5 MG PO TABS: 5.0000 mg | ORAL_TABLET | Freq: Every evening | ORAL | 3 refills | Status: DC

## 2022-08-01 NOTE — Assessment & Plan Note (Signed)
Bilateral shoulder pain with the left greater than the right unknown etiology.  Unclear at this time if the patient's recent exercise regimen has caused an overuse injury versus damage to the shoulder musculature.  Her range of motion is fairly well intact as is her strength.  She is very concerned about the limitations in her exercise.  Given the patient's concerns we will send referral to orthopedics for further evaluation.  Dr. Teddy Spike does specialize in shoulders and hopefully he will be able to pinpoint what neck steps are best for her.
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
## 2022-08-01 NOTE — Assessment & Plan Note (Signed)
Chronic insomnia not well controlled at this time.  Unfortunately the patient has not found the same effect with hydroxyzine recently that she did in the past.  She has been taking an old prescription of amitriptyline and combining this with tizanidine and valerian root to help get to sleep.  Encouraged the patient to avoid overmedicating as these medications can be sedative and cause a decrease in respiratory rate and response which could be life-threatening.  I did discuss with her the option of continuing the amitriptyline and she is interested in this.  We will send refills today.

## 2022-08-01 NOTE — Assessment & Plan Note (Signed)
Appx 42mm round, firm, non-tender, non-mobile mass noted to the left breast at the 7 o'clock position. No skin changes noted with exception of small area of intertrigo to the left from friction and moisture associated with brazier. Will send for diagnostic mammogram evaluation. Patient has not had a recent mammogram. Will follow for recommendations.
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
## 2022-08-01 NOTE — Patient Instructions (Addendum)
I want to send you to see an orthopedic doctor for your shoulders. They will call you to schedule.   I have sent the order for the mammogram- they will call you to schedule this.   I have sent in the amitriptyline to the pharmacy. You can take 1-2 at bedtime to help with sleep.  I have sent in the xyzal for the mucous- this is an allergy medication that should really help  I will also check labs to see if there is anything abnormal showing that could be causing you to be so tired.
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
## 2022-08-02 ENCOUNTER — Encounter (HOSPITAL_BASED_OUTPATIENT_CLINIC_OR_DEPARTMENT_OTHER): Payer: Self-pay | Admitting: Nurse Practitioner

## 2022-08-02 ENCOUNTER — Other Ambulatory Visit (HOSPITAL_BASED_OUTPATIENT_CLINIC_OR_DEPARTMENT_OTHER): Payer: Self-pay | Admitting: Nurse Practitioner

## 2022-08-02 DIAGNOSIS — N6324 Unspecified lump in the left breast, lower inner quadrant: Secondary | ICD-10-CM

## 2022-08-02 LAB — COMPREHENSIVE METABOLIC PANEL
ALT: 15 IU/L (ref 0–32)
AST: 11 IU/L (ref 0–40)
Albumin/Globulin Ratio: 1.5 (ref 1.2–2.2)
Albumin: 4.4 g/dL (ref 3.9–4.9)
Alkaline Phosphatase: 82 IU/L (ref 44–121)
BUN/Creatinine Ratio: 15 (ref 12–28)
BUN: 23 mg/dL (ref 8–27)
Bilirubin Total: 0.4 mg/dL (ref 0.0–1.2)
CO2: 25 mmol/L (ref 20–29)
Calcium: 9.7 mg/dL (ref 8.7–10.3)
Chloride: 96 mmol/L (ref 96–106)
Creatinine, Ser: 1.57 mg/dL — ABNORMAL HIGH (ref 0.57–1.00)
Globulin, Total: 2.9 g/dL (ref 1.5–4.5)
Glucose: 127 mg/dL — ABNORMAL HIGH (ref 70–99)
Potassium: 4.4 mmol/L (ref 3.5–5.2)
Sodium: 136 mmol/L (ref 134–144)
Total Protein: 7.3 g/dL (ref 6.0–8.5)
eGFR: 36 mL/min/{1.73_m2} — ABNORMAL LOW (ref >59)

## 2022-08-02 LAB — CBC WITH DIFFERENTIAL/PLATELET
Basophils Absolute: 0 10*3/uL (ref 0.0–0.2)
Basos: 1 %
EOS (ABSOLUTE): 0.2 10*3/uL (ref 0.0–0.4)
Eos: 4 %
Hematocrit: 37.2 % (ref 34.0–46.6)
Hemoglobin: 11.9 g/dL (ref 11.1–15.9)
Immature Grans (Abs): 0 10*3/uL (ref 0.0–0.1)
Immature Granulocytes: 0 %
Lymphocytes Absolute: 1.4 10*3/uL (ref 0.7–3.1)
Lymphs: 23 %
MCH: 27.4 pg (ref 26.6–33.0)
MCHC: 32 g/dL (ref 31.5–35.7)
MCV: 86 fL (ref 79–97)
Monocytes Absolute: 0.5 10*3/uL (ref 0.1–0.9)
Monocytes: 8 %
Neutrophils Absolute: 3.7 10*3/uL (ref 1.4–7.0)
Neutrophils: 64 %
Platelets: 342 10*3/uL (ref 150–450)
RBC: 4.34 x10E6/uL (ref 3.77–5.28)
RDW: 12.9 % (ref 11.7–15.4)
WBC: 5.8 10*3/uL (ref 3.4–10.8)

## 2022-08-02 LAB — TSH: TSH: 2.68 u[IU]/mL (ref 0.450–4.500)

## 2022-08-02 LAB — VITAMIN D 25 HYDROXY (VIT D DEFICIENCY, FRACTURES): Vit D, 25-Hydroxy: 36.7 ng/mL (ref 30.0–100.0)

## 2022-08-02 LAB — B12 AND FOLATE PANEL
Folate: 12.6 ng/mL (ref >3.0)
Vitamin B-12: 613 pg/mL (ref 232–1245)

## 2022-08-04 ENCOUNTER — Ambulatory Visit (INDEPENDENT_AMBULATORY_CARE_PROVIDER_SITE_OTHER): Payer: HMO

## 2022-08-04 ENCOUNTER — Ambulatory Visit (INDEPENDENT_AMBULATORY_CARE_PROVIDER_SITE_OTHER): Payer: HMO | Admitting: Orthopaedic Surgery

## 2022-08-04 DIAGNOSIS — M25512 Pain in left shoulder: Secondary | ICD-10-CM | POA: Diagnosis not present

## 2022-08-04 DIAGNOSIS — M25511 Pain in right shoulder: Secondary | ICD-10-CM

## 2022-08-04 DIAGNOSIS — M7581 Other shoulder lesions, right shoulder: Secondary | ICD-10-CM

## 2022-08-04 DIAGNOSIS — M7582 Other shoulder lesions, left shoulder: Secondary | ICD-10-CM

## 2022-08-04 MED ORDER — LIDOCAINE HCL 1 % IJ SOLN
4.0000 mL | INTRAMUSCULAR | Status: AC | PRN
  Administered 2022-08-04: 4 mL

## 2022-08-04 MED ORDER — TRIAMCINOLONE ACETONIDE 40 MG/ML IJ SUSP
80.0000 mg | INTRAMUSCULAR | Status: AC | PRN
  Administered 2022-08-04: 80 mg via INTRA_ARTICULAR
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
## 2022-08-07 ENCOUNTER — Ambulatory Visit (HOSPITAL_BASED_OUTPATIENT_CLINIC_OR_DEPARTMENT_OTHER): Payer: HMO | Admitting: Nurse Practitioner

## 2022-08-15 ENCOUNTER — Ambulatory Visit: Payer: HMO | Admitting: Rehabilitative and Restorative Service Providers"

## 2022-08-15 ENCOUNTER — Ambulatory Visit (HOSPITAL_BASED_OUTPATIENT_CLINIC_OR_DEPARTMENT_OTHER): Payer: HMO | Admitting: Nurse Practitioner

## 2022-08-15 ENCOUNTER — Ambulatory Visit (INDEPENDENT_AMBULATORY_CARE_PROVIDER_SITE_OTHER): Payer: HMO | Admitting: Rehabilitative and Restorative Service Providers"

## 2022-08-15 ENCOUNTER — Encounter: Payer: Self-pay | Admitting: Rehabilitative and Restorative Service Providers"

## 2022-08-15 DIAGNOSIS — M25512 Pain in left shoulder: Secondary | ICD-10-CM | POA: Diagnosis not present

## 2022-08-15 DIAGNOSIS — R293 Abnormal posture: Secondary | ICD-10-CM

## 2022-08-15 DIAGNOSIS — M6281 Muscle weakness (generalized): Secondary | ICD-10-CM

## 2022-08-15 DIAGNOSIS — M25612 Stiffness of left shoulder, not elsewhere classified: Secondary | ICD-10-CM

## 2022-08-15 DIAGNOSIS — M25611 Stiffness of right shoulder, not elsewhere classified: Secondary | ICD-10-CM | POA: Diagnosis not present

## 2022-08-15 DIAGNOSIS — M25511 Pain in right shoulder: Secondary | ICD-10-CM

## 2022-08-15 DIAGNOSIS — G8929 Other chronic pain: Secondary | ICD-10-CM
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
## 2022-08-18 ENCOUNTER — Other Ambulatory Visit (HOSPITAL_BASED_OUTPATIENT_CLINIC_OR_DEPARTMENT_OTHER): Payer: Self-pay | Admitting: Nurse Practitioner

## 2022-08-18 ENCOUNTER — Ambulatory Visit (HOSPITAL_BASED_OUTPATIENT_CLINIC_OR_DEPARTMENT_OTHER): Payer: HMO | Admitting: Orthopaedic Surgery

## 2022-08-18 DIAGNOSIS — I129 Hypertensive chronic kidney disease with stage 1 through stage 4 chronic kidney disease, or unspecified chronic kidney disease: Secondary | ICD-10-CM

## 2022-08-18 DIAGNOSIS — T7840XA Allergy, unspecified, initial encounter: Secondary | ICD-10-CM

## 2022-08-21 ENCOUNTER — Ambulatory Visit
Admission: RE | Admit: 2022-08-21 | Discharge: 2022-08-21 | Disposition: A | Discharge status: Home / Self Care | Payer: HMO | Source: Ambulatory Visit | Attending: Nurse Practitioner | Admitting: Nurse Practitioner

## 2022-08-21 DIAGNOSIS — N6324 Unspecified lump in the left breast, lower inner quadrant: Secondary | ICD-10-CM

## 2022-08-22 ENCOUNTER — Ambulatory Visit (INDEPENDENT_AMBULATORY_CARE_PROVIDER_SITE_OTHER): Payer: HMO | Admitting: Rehabilitative and Restorative Service Providers"

## 2022-08-22 ENCOUNTER — Encounter: Payer: Self-pay | Admitting: Rehabilitative and Restorative Service Providers"

## 2022-08-22 DIAGNOSIS — M25612 Stiffness of left shoulder, not elsewhere classified: Secondary | ICD-10-CM | POA: Diagnosis not present

## 2022-08-22 DIAGNOSIS — M25611 Stiffness of right shoulder, not elsewhere classified: Secondary | ICD-10-CM | POA: Diagnosis not present

## 2022-08-22 DIAGNOSIS — M6281 Muscle weakness (generalized): Secondary | ICD-10-CM

## 2022-08-22 DIAGNOSIS — R293 Abnormal posture: Secondary | ICD-10-CM

## 2022-08-22 DIAGNOSIS — M25512 Pain in left shoulder: Secondary | ICD-10-CM

## 2022-08-22 DIAGNOSIS — M25511 Pain in right shoulder: Secondary | ICD-10-CM

## 2022-08-22 DIAGNOSIS — G8929 Other chronic pain: Secondary | ICD-10-CM
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
## 2022-08-24 ENCOUNTER — Encounter: Payer: HMO | Admitting: Physical Therapy

## 2022-08-28 ENCOUNTER — Ambulatory Visit (INDEPENDENT_AMBULATORY_CARE_PROVIDER_SITE_OTHER): Payer: HMO | Admitting: Physical Therapy

## 2022-08-28 ENCOUNTER — Encounter: Payer: Self-pay | Admitting: Physical Therapy

## 2022-08-28 DIAGNOSIS — M25611 Stiffness of right shoulder, not elsewhere classified: Secondary | ICD-10-CM | POA: Diagnosis not present

## 2022-08-28 DIAGNOSIS — M25612 Stiffness of left shoulder, not elsewhere classified: Secondary | ICD-10-CM

## 2022-08-28 DIAGNOSIS — M25512 Pain in left shoulder: Secondary | ICD-10-CM

## 2022-08-28 DIAGNOSIS — R293 Abnormal posture: Secondary | ICD-10-CM

## 2022-08-28 DIAGNOSIS — M6281 Muscle weakness (generalized): Secondary | ICD-10-CM

## 2022-08-28 DIAGNOSIS — G8929 Other chronic pain: Secondary | ICD-10-CM

## 2022-08-28 DIAGNOSIS — M25511 Pain in right shoulder: Secondary | ICD-10-CM
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
## 2022-08-31 ENCOUNTER — Encounter: Payer: HMO | Admitting: Rehabilitative and Restorative Service Providers"

## 2022-08-31 ENCOUNTER — Telehealth: Payer: Self-pay | Admitting: Rehabilitative and Restorative Service Providers"

## 2022-08-31 NOTE — Telephone Encounter (Signed)
Cynthia [name] said she called and left a message to cancel.  She is feeling poorly and is concerned she might have Covid.  I reminded her of her 1:45 appointment on Monday and asked her to let us know if she is unable to attend.

## 2022-09-04 ENCOUNTER — Ambulatory Visit (INDEPENDENT_AMBULATORY_CARE_PROVIDER_SITE_OTHER): Payer: HMO | Admitting: Physical Therapy

## 2022-09-04 ENCOUNTER — Encounter: Payer: Self-pay | Admitting: Physical Therapy

## 2022-09-04 DIAGNOSIS — M25512 Pain in left shoulder: Secondary | ICD-10-CM

## 2022-09-04 DIAGNOSIS — M25612 Stiffness of left shoulder, not elsewhere classified: Secondary | ICD-10-CM

## 2022-09-04 DIAGNOSIS — M25611 Stiffness of right shoulder, not elsewhere classified: Secondary | ICD-10-CM | POA: Diagnosis not present

## 2022-09-04 DIAGNOSIS — R293 Abnormal posture: Secondary | ICD-10-CM

## 2022-09-04 DIAGNOSIS — M25511 Pain in right shoulder: Secondary | ICD-10-CM

## 2022-09-04 DIAGNOSIS — G8929 Other chronic pain: Secondary | ICD-10-CM

## 2022-09-04 DIAGNOSIS — M6281 Muscle weakness (generalized): Secondary | ICD-10-CM
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
## 2022-09-06 ENCOUNTER — Encounter: Payer: HMO | Admitting: Physical Therapy
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
## 2022-09-09 ENCOUNTER — Other Ambulatory Visit (HOSPITAL_BASED_OUTPATIENT_CLINIC_OR_DEPARTMENT_OTHER): Payer: Self-pay | Admitting: Nurse Practitioner

## 2022-09-09 DIAGNOSIS — M797 Fibromyalgia: Secondary | ICD-10-CM

## 2022-09-11 ENCOUNTER — Ambulatory Visit (INDEPENDENT_AMBULATORY_CARE_PROVIDER_SITE_OTHER): Payer: HMO | Admitting: Physical Therapy

## 2022-09-11 ENCOUNTER — Encounter: Payer: Self-pay | Admitting: Physical Therapy

## 2022-09-11 DIAGNOSIS — R293 Abnormal posture: Secondary | ICD-10-CM

## 2022-09-11 DIAGNOSIS — M25611 Stiffness of right shoulder, not elsewhere classified: Secondary | ICD-10-CM | POA: Diagnosis not present

## 2022-09-11 DIAGNOSIS — M6281 Muscle weakness (generalized): Secondary | ICD-10-CM

## 2022-09-11 DIAGNOSIS — M25612 Stiffness of left shoulder, not elsewhere classified: Secondary | ICD-10-CM

## 2022-09-11 DIAGNOSIS — M25512 Pain in left shoulder: Secondary | ICD-10-CM | POA: Diagnosis not present

## 2022-09-11 DIAGNOSIS — M25511 Pain in right shoulder: Secondary | ICD-10-CM

## 2022-09-11 DIAGNOSIS — G8929 Other chronic pain: Secondary | ICD-10-CM
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
## 2022-09-13 ENCOUNTER — Other Ambulatory Visit: Payer: HMO

## 2022-09-21 ENCOUNTER — Encounter: Payer: HMO | Admitting: Physical Therapy
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
## 2022-09-26 ENCOUNTER — Ambulatory Visit (HOSPITAL_COMMUNITY)
Admission: EM | Admit: 2022-09-26 | Discharge: 2022-09-26 | Disposition: A | Discharge status: Home / Self Care | Payer: HMO | Attending: Physician Assistant | Admitting: Physician Assistant

## 2022-09-26 ENCOUNTER — Encounter (HOSPITAL_COMMUNITY): Payer: Self-pay

## 2022-09-26 DIAGNOSIS — H60501 Unspecified acute noninfective otitis externa, right ear: Secondary | ICD-10-CM | POA: Diagnosis not present

## 2022-09-26 MED ORDER — CIPROFLOXACIN-DEXAMETHASONE 0.3-0.1 % OT SUSP: 4.0000 [drp] | Freq: Two times a day (BID) | OTIC | 0 refills | Status: AC
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
## 2022-09-26 NOTE — ED Triage Notes (Signed)
Pt c/o rt ear itching around 2wks ago and put a few drops of olive oil with relief. States having sharp pain to rt ear since yesterday with pain on moving her neck. Took tylenol today with some relief.
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
## 2022-09-27 ENCOUNTER — Ambulatory Visit (INDEPENDENT_AMBULATORY_CARE_PROVIDER_SITE_OTHER): Payer: HMO | Admitting: Physical Therapy

## 2022-09-27 ENCOUNTER — Encounter: Payer: Self-pay | Admitting: Physical Therapy

## 2022-09-27 DIAGNOSIS — M6281 Muscle weakness (generalized): Secondary | ICD-10-CM

## 2022-09-27 DIAGNOSIS — M25611 Stiffness of right shoulder, not elsewhere classified: Secondary | ICD-10-CM | POA: Diagnosis not present

## 2022-09-27 DIAGNOSIS — M25612 Stiffness of left shoulder, not elsewhere classified: Secondary | ICD-10-CM | POA: Diagnosis not present

## 2022-09-27 DIAGNOSIS — M25512 Pain in left shoulder: Secondary | ICD-10-CM

## 2022-09-27 DIAGNOSIS — R293 Abnormal posture: Secondary | ICD-10-CM

## 2022-09-27 DIAGNOSIS — G8929 Other chronic pain: Secondary | ICD-10-CM

## 2022-09-27 DIAGNOSIS — M25511 Pain in right shoulder: Secondary | ICD-10-CM

## 2022-10-02 ENCOUNTER — Encounter: Payer: Self-pay | Admitting: Nurse Practitioner

## 2022-10-04 ENCOUNTER — Other Ambulatory Visit: Payer: Self-pay

## 2022-10-04 ENCOUNTER — Encounter: Payer: Self-pay | Admitting: Internal Medicine

## 2022-10-04 ENCOUNTER — Encounter (HOSPITAL_BASED_OUTPATIENT_CLINIC_OR_DEPARTMENT_OTHER): Payer: Self-pay | Admitting: Cardiovascular Disease

## 2022-10-04 ENCOUNTER — Ambulatory Visit (INDEPENDENT_AMBULATORY_CARE_PROVIDER_SITE_OTHER): Payer: HMO | Admitting: Cardiovascular Disease

## 2022-10-04 ENCOUNTER — Other Ambulatory Visit: Payer: Self-pay | Admitting: Internal Medicine

## 2022-10-04 VITALS — BP 136/80 | HR 92 | Ht 66.0 in | Wt 237.6 lb

## 2022-10-04 DIAGNOSIS — Z794 Long term (current) use of insulin: Secondary | ICD-10-CM

## 2022-10-04 DIAGNOSIS — E785 Hyperlipidemia, unspecified: Secondary | ICD-10-CM | POA: Diagnosis not present

## 2022-10-04 DIAGNOSIS — Z006 Encounter for examination for normal comparison and control in clinical research program: Secondary | ICD-10-CM

## 2022-10-04 DIAGNOSIS — I152 Hypertension secondary to endocrine disorders: Secondary | ICD-10-CM | POA: Diagnosis not present

## 2022-10-04 DIAGNOSIS — E119 Type 2 diabetes mellitus without complications: Secondary | ICD-10-CM

## 2022-10-04 DIAGNOSIS — E1159 Type 2 diabetes mellitus with other circulatory complications: Secondary | ICD-10-CM | POA: Diagnosis not present

## 2022-10-04 MED ORDER — INSULIN GLARGINE-YFGN 100 UNIT/ML ~~LOC~~ SOPN: 26.0000 [IU] | PEN_INJECTOR | Freq: Every day | SUBCUTANEOUS | 1 refills | Status: DC

## 2022-10-04 MED ORDER — LANTUS SOLOSTAR 100 UNIT/ML ~~LOC~~ SOPN: 26.0000 [IU] | PEN_INJECTOR | Freq: Every day | SUBCUTANEOUS | 0 refills | Status: DC

## 2022-10-04 NOTE — Assessment & Plan Note (Signed)
Continue atorvastatin
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
## 2022-10-04 NOTE — Patient Instructions (Signed)
Medication Instructions:  Your physician recommends that you continue on your current medications as directed. Please refer to the Current Medication list given to you today.   Labwork: NONE  Testing/Procedures: NONE  Follow-Up: 04/04/2023 10:00 AM WITH DR Doolittle   You have been referred to DR NIDA  IF YOU DO NOT HEAR FROM THEM IN 2 WEEKS YOU CAN CALL THEM DIRECTLY  If you need a refill on your cardiac medications before your next appointment, please call your pharmacy.

## 2022-10-04 NOTE — Research (Signed)
I saw pt today after Dr. Hackleburg's follow up visit. Pt is in Dr. Fillmore's Virtual Care HTN Study. Pt filled out research survey. Pt was enrolled in Group 2. Pt has successfully reached her blood pressure goal and completed the Virtual Care HTN Study. 

## 2022-10-04 NOTE — Assessment & Plan Note (Addendum)
She is struggled with her blood pressure and blood sugars lately.  She has not been able to be as active after rotator cuff injury.  She also has problems with her knees.  She is really looking forward to getting back into the water and something.  Blood pressures at home have been very well controlled.  It was slightly above goal today in the office.  Continue with amlodipine, losartan/HCTZ, and metoprolol.  Continue working on limiting her sodium intake.  She really struggles with her diet and does not know what she can eat.  Her intake has been low and she is not consistently taking Lantus or also her blood sugars get too low.  She did not do well with the healthy weight and wellness diet because she found it hard to eat as much as they recommended and does not want to eat as much meat as they recommend.  I think that she would really benefit from talking with Dr. Fransico Him and discussing left telemedicine as well as other management strategies for her diabetes.

## 2022-10-11 ENCOUNTER — Encounter: Payer: HMO | Admitting: Physical Therapy

## 2022-10-17 ENCOUNTER — Ambulatory Visit (INDEPENDENT_AMBULATORY_CARE_PROVIDER_SITE_OTHER): Payer: HMO | Admitting: Physical Therapy

## 2022-10-17 ENCOUNTER — Encounter: Payer: Self-pay | Admitting: Physical Therapy

## 2022-10-17 DIAGNOSIS — M25611 Stiffness of right shoulder, not elsewhere classified: Secondary | ICD-10-CM

## 2022-10-17 DIAGNOSIS — M25512 Pain in left shoulder: Secondary | ICD-10-CM

## 2022-10-17 DIAGNOSIS — M25612 Stiffness of left shoulder, not elsewhere classified: Secondary | ICD-10-CM

## 2022-10-17 DIAGNOSIS — M6281 Muscle weakness (generalized): Secondary | ICD-10-CM

## 2022-10-17 DIAGNOSIS — R293 Abnormal posture: Secondary | ICD-10-CM

## 2022-10-17 DIAGNOSIS — G8929 Other chronic pain: Secondary | ICD-10-CM

## 2022-10-17 DIAGNOSIS — M25511 Pain in right shoulder: Secondary | ICD-10-CM
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
## 2022-10-25 ENCOUNTER — Other Ambulatory Visit (HOSPITAL_BASED_OUTPATIENT_CLINIC_OR_DEPARTMENT_OTHER): Payer: Self-pay | Admitting: Nurse Practitioner

## 2022-10-25 DIAGNOSIS — E785 Hyperlipidemia, unspecified: Secondary | ICD-10-CM

## 2022-10-27 ENCOUNTER — Other Ambulatory Visit (HOSPITAL_BASED_OUTPATIENT_CLINIC_OR_DEPARTMENT_OTHER): Payer: Self-pay

## 2022-10-27 ENCOUNTER — Ambulatory Visit (INDEPENDENT_AMBULATORY_CARE_PROVIDER_SITE_OTHER): Payer: HMO | Admitting: Orthopaedic Surgery

## 2022-10-27 DIAGNOSIS — M7581 Other shoulder lesions, right shoulder: Secondary | ICD-10-CM | POA: Diagnosis not present

## 2022-10-27 DIAGNOSIS — M7582 Other shoulder lesions, left shoulder: Secondary | ICD-10-CM | POA: Diagnosis not present

## 2022-10-27 MED ORDER — DICLOFENAC SODIUM 1 % EX GEL
4.0000 g | Freq: Four times a day (QID) | CUTANEOUS | 3 refills | Status: DC
  Filled 2022-10-27: qty 100, 12d supply, fill #0
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
## 2022-11-11 ENCOUNTER — Encounter (HOSPITAL_COMMUNITY): Payer: Self-pay | Admitting: Urgent Care

## 2022-11-11 ENCOUNTER — Ambulatory Visit (HOSPITAL_COMMUNITY)
Admission: EM | Admit: 2022-11-11 | Discharge: 2022-11-11 | Disposition: A | Discharge status: Home / Self Care | Payer: 59 | Attending: Urgent Care | Admitting: Urgent Care

## 2022-11-11 ENCOUNTER — Other Ambulatory Visit: Payer: Self-pay

## 2022-11-11 DIAGNOSIS — Z87891 Personal history of nicotine dependence: Secondary | ICD-10-CM | POA: Insufficient documentation

## 2022-11-11 DIAGNOSIS — R059 Cough, unspecified: Secondary | ICD-10-CM | POA: Diagnosis present

## 2022-11-11 DIAGNOSIS — B349 Viral infection, unspecified: Secondary | ICD-10-CM | POA: Diagnosis not present

## 2022-11-11 DIAGNOSIS — Z1152 Encounter for screening for COVID-19: Secondary | ICD-10-CM | POA: Diagnosis not present

## 2022-11-11 LAB — SARS CORONAVIRUS 2 (TAT 6-24 HRS): SARS Coronavirus 2: NEGATIVE

## 2022-11-11 MED ORDER — HYDROCOD POLI-CHLORPHE POLI ER 10-8 MG/5ML PO SUER: 5.0000 mL | Freq: Every evening | ORAL | 0 refills | Status: DC | PRN

## 2022-11-11 MED ORDER — ONDANSETRON 4 MG PO TBDP
4.0000 mg | ORAL_TABLET | Freq: Once | ORAL | Status: AC
  Administered 2022-11-11: 4 mg via ORAL

## 2022-11-11 MED ORDER — ONDANSETRON 4 MG PO TBDP: 4.0000 mg | ORAL_TABLET | Freq: Three times a day (TID) | ORAL | 0 refills | Status: DC | PRN

## 2022-11-11 MED ORDER — ONDANSETRON 4 MG PO TBDP
ORAL_TABLET | ORAL | Status: AC
  Filled 2022-11-11: qty 1
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
## 2022-11-11 NOTE — Discharge Instructions (Addendum)
You were tested today for COVID.  We will get your results back within the next 24 hours. Your symptoms sound consistent with some type of viral illness. You do not meet the testing criteria for influenza because it is over 48 hours of symptoms. To help with your nausea, we gave you Zofran in our clinic.  We will send you home with some Zofran as well to take every 8 hours as needed for nausea or vomiting. I have called in some cough medication to help with your congestion and cough.  For your body aches, please purchase over-the-counter Oscillococcinum  REST. Drink plenty of water. Gatorade and pedialyte can help replenish electrolytes Return if you develop any worsening symptoms such as uncontrolled fever, shortness of breath, or chest pains.

## 2022-11-11 NOTE — ED Triage Notes (Signed)
Pt reports she has been sick since Thursday. Pt has a cough since Thursday . Pt reports nausea and has bee nauseated . Pt also has HA.

## 2022-11-12 ENCOUNTER — Other Ambulatory Visit (HOSPITAL_BASED_OUTPATIENT_CLINIC_OR_DEPARTMENT_OTHER): Payer: Self-pay | Admitting: Nurse Practitioner

## 2022-11-12 DIAGNOSIS — I129 Hypertensive chronic kidney disease with stage 1 through stage 4 chronic kidney disease, or unspecified chronic kidney disease: Secondary | ICD-10-CM

## 2022-11-15 ENCOUNTER — Encounter: Payer: HMO | Admitting: Physical Therapy

## 2022-11-17 ENCOUNTER — Other Ambulatory Visit (HOSPITAL_BASED_OUTPATIENT_CLINIC_OR_DEPARTMENT_OTHER): Payer: Self-pay | Admitting: Nurse Practitioner

## 2022-11-17 DIAGNOSIS — E785 Hyperlipidemia, unspecified: Secondary | ICD-10-CM

## 2022-11-23 ENCOUNTER — Ambulatory Visit (HOSPITAL_BASED_OUTPATIENT_CLINIC_OR_DEPARTMENT_OTHER): Payer: HMO | Admitting: Physical Therapy

## 2022-12-04 ENCOUNTER — Other Ambulatory Visit (HOSPITAL_BASED_OUTPATIENT_CLINIC_OR_DEPARTMENT_OTHER): Payer: Self-pay | Admitting: Nurse Practitioner

## 2022-12-04 DIAGNOSIS — E785 Hyperlipidemia, unspecified: Secondary | ICD-10-CM

## 2022-12-07 ENCOUNTER — Ambulatory Visit (INDEPENDENT_AMBULATORY_CARE_PROVIDER_SITE_OTHER): Payer: HMO | Admitting: Nurse Practitioner

## 2022-12-07 ENCOUNTER — Encounter: Payer: Self-pay | Admitting: Nurse Practitioner

## 2022-12-07 VITALS — BP 130/82 | HR 71 | Wt 244.3 lb

## 2022-12-07 DIAGNOSIS — B49 Unspecified mycosis: Secondary | ICD-10-CM | POA: Diagnosis not present

## 2022-12-07 DIAGNOSIS — J3089 Other allergic rhinitis: Secondary | ICD-10-CM | POA: Diagnosis not present

## 2022-12-07 DIAGNOSIS — G63 Polyneuropathy in diseases classified elsewhere: Secondary | ICD-10-CM

## 2022-12-07 DIAGNOSIS — I739 Peripheral vascular disease, unspecified: Secondary | ICD-10-CM

## 2022-12-07 DIAGNOSIS — M797 Fibromyalgia: Secondary | ICD-10-CM | POA: Diagnosis not present

## 2022-12-07 DIAGNOSIS — N183 Chronic kidney disease, stage 3 unspecified: Secondary | ICD-10-CM

## 2022-12-07 DIAGNOSIS — E1159 Type 2 diabetes mellitus with other circulatory complications: Secondary | ICD-10-CM

## 2022-12-07 DIAGNOSIS — E785 Hyperlipidemia, unspecified: Secondary | ICD-10-CM

## 2022-12-07 DIAGNOSIS — I890 Lymphedema, not elsewhere classified: Secondary | ICD-10-CM

## 2022-12-07 DIAGNOSIS — Z794 Long term (current) use of insulin: Secondary | ICD-10-CM

## 2022-12-07 DIAGNOSIS — I152 Hypertension secondary to endocrine disorders: Secondary | ICD-10-CM

## 2022-12-07 DIAGNOSIS — E1122 Type 2 diabetes mellitus with diabetic chronic kidney disease: Secondary | ICD-10-CM | POA: Diagnosis not present

## 2022-12-07 DIAGNOSIS — Z6838 Body mass index (BMI) 38.0-38.9, adult: Secondary | ICD-10-CM

## 2022-12-07 DIAGNOSIS — I5032 Chronic diastolic (congestive) heart failure: Secondary | ICD-10-CM

## 2022-12-07 MED ORDER — ATORVASTATIN CALCIUM 20 MG PO TABS: ORAL_TABLET | ORAL | 1 refills | Status: DC

## 2022-12-07 MED ORDER — ITRACONAZOLE 100 MG PO CAPS: ORAL_CAPSULE | ORAL | 2 refills | Status: DC

## 2022-12-07 MED ORDER — TIZANIDINE HCL 4 MG PO CAPS: ORAL_CAPSULE | ORAL | 1 refills | Status: DC

## 2022-12-07 NOTE — Progress Notes (Signed)
Cynthia Clamp, DNP, AGNP-c [facility]  [address] [place], [place] [address] [phone]  ESTABLISHED PATIENT- Chronic Health and/or Follow-Up Visit  Blood pressure 130/82, pulse 71, weight 244 lb 4.8 oz (110.8 kg).    Cynthia [name] is a 67 y.o. year old female presenting today for evaluation and management of the following: Lymphedema Swelling in the lower extremities. Previously diagnosed with lymphedema. She was treated for this in the past with the lymphedema clinic, but has not had treatment in the last year or two. She is interested in treatment again if she can get in somewhere in [place]. She tells me the swelling is worsening. She is not having any skin breakdown right now.   Mucous She experiences chronic. dark green mucous in the nose that hardens in the nasal passages and prevents her from breathing well. This is a chronic issue that is not resolving with allergy therapy and is present on a daily basis. The mucous is thick enough that she can pull it out of her nose with tweezers. This is resulting in multiple sinus infections. She is also coughing up solid chunks of yellow mucous every morning.     All ROS negative with exception of what is listed above.   PHYSICAL EXAM Physical Exam Vitals and nursing note reviewed.  Constitutional:      General: She is not in acute distress.    Appearance: Normal appearance.  HENT:     Head: Normocephalic.     Right Ear: Tympanic membrane normal.     Left Ear: Tympanic membrane normal.     Nose: Mucosal edema present.     Right Turbinates: Enlarged and swollen.     Left Turbinates: Enlarged and swollen.     Right Sinus: Maxillary sinus tenderness present. No frontal sinus tenderness.     Left Sinus: Maxillary sinus tenderness present. No frontal sinus tenderness.     Mouth/Throat:     Mouth: Mucous membranes are moist.     Tongue: No lesions. Tongue does not deviate from midline.     Pharynx:  Oropharynx is clear. Uvula midline. No oropharyngeal exudate.  Eyes:     Extraocular Movements: Extraocular movements intact.     Conjunctiva/sclera: Conjunctivae normal.     Pupils: Pupils are equal, round, and reactive to light.  Neck:     Vascular: No carotid bruit.  Cardiovascular:     Rate and Rhythm: Normal rate and regular rhythm.     Pulses: Normal pulses.     Heart sounds: Normal heart sounds. No murmur heard. Pulmonary:     Effort: Pulmonary effort is normal.     Breath sounds: Normal breath sounds. No wheezing.  Abdominal:     General: Bowel sounds are normal. There is no distension.     Palpations: Abdomen is soft.     Tenderness: There is no abdominal tenderness. There is no guarding.  Musculoskeletal:        General: Normal range of motion.     Cervical back: Normal range of motion and neck supple.     Right lower leg: Edema present.     Left lower leg: Edema present.  Lymphadenopathy:     Cervical: No cervical adenopathy.  Skin:    General: Skin is warm and dry.     Capillary Refill: Capillary refill takes less than 2 seconds.  Neurological:     General: No focal deficit present.     Mental Status: She is alert and oriented to person,  place, and time.  Psychiatric:        Mood and Affect: Mood normal.        Behavior: Behavior normal.        Thought Content: Thought content normal.        Judgment: Judgment normal.     PLAN Problem List Items Addressed This Visit     Peripheral vascular disease, unspecified ([place])    PVD with complication of lymphedema present. At this time she does not have pitting edema, but swelling is present in the LE's. Skin intact without concerns for weeping or breakdown at this time.  Recommendations: Continue to keep sodium intake low to prevent worsening swelling in the legs.  Wear compression stockings on your legs as much as possible. Keep your legs propped up throughout the day when you are sitting down to help with fluid  return up to your heart.  Let me know if you have worsening swelling or if you notice skin breakdown.       Relevant Medications   atorvastatin (LIPITOR) 20 MG tablet   Fibromyalgia syndrome    Chronic. She has pain control with PRN tizanidine and daily nortriptyline. At this time, I feel that it is reasonable to continue this medication as she is not having worsening of symptoms.  Recommend: Continue current medications for pain control Try to walk as much as possible to keep your joints moving to help reduce worsening pain Let me know if your pain is no longer controlled with your current treatments.       Relevant Medications   nortriptyline (PAMELOR) 25 MG capsule   tiZANidine (ZANAFLEX) 4 MG capsule   Hyperlipidemia LDL goal <100    Chronic. She is followed by cardiology. She is following a low fat, low carb diet. She is taking her medication daily without any side effects. We will plan to continue her medications at this time. Recommend Continue current medications.  I have sent refills in for you today.  Continue with your diet monitoring.       Relevant Medications   atorvastatin (LIPITOR) 20 MG tablet   Polyneuropathy associated with underlying disease (HCC)    Chronic. Currently managed with nortriptyline with no alarm symptoms at this time.  Recommend: Continue with current medications.  Keep blood sugars under control to prevent worsening of neuropathy. Let me know if the nortriptyline is not helping with your pain.       Relevant Medications   nortriptyline (PAMELOR) 25 MG capsule   tiZANidine (ZANAFLEX) 4 MG capsule   Chronic diastolic heart failure (HCC)    Chronic HF in the setting of poorly controlled DM2, resistant HTN, Stg 3 CKD, and OSA. LE edema is present at this time, however, it appears mostly to be lymphedema as there is minimal pitting present. Lungs are clear and she is not having any signs of exacerbation, which is reassuring.   Recommendations Continue with your current medications Keep good control of your blood pressure, diabetes, and sleep apnea to prevent worsening of your heart strength Let me know immediately if you start to have shortness of breath or worsening leg swelling that doesn't seem to improve after keeping your feet propped up overnight.       Relevant Medications   atorvastatin (LIPITOR) 20 MG tablet   Hypertension associated with diabetes (HCC)    Chronic. Currently followed with cardiology. In setting of DM, HLD, CHF, and CKD her risk factors for complications are high. No alarm symptoms are  present today. Her blood pressure is slightly above goal of < 130/80. Recent labs reviewed.  Recommendations: Continue with current medications.  Continue to follow closely with cardiology.  Monitor your BP at home, if you can, at least a few days a week to ensure that the levels are not staying high Work on low fat, low carb, low sodium diet. Eat plenty of fresh vegetables and make sure you are getting at least 30g of fiber a day.       Relevant Medications   atorvastatin (LIPITOR) 20 MG tablet   Type 2 diabetes mellitus with stage 3 chronic kidney disease, with long-term current use of insulin (HCC)    Chronic. Currently using a CGM which is very helpful for management of her diabetes. Blood pressures appear to be fairly well controlled at this time. She is already on [place] for diabetes, cardiovascular protection, and kidney protection.She is not eligible for GLP as she has a history of pancreatitis. She is following with endocrinology regularly.  Recommendations: Continue to monitor and manage your blood sugar and blood pressures and keep these within normal limits to prevent further damage to your kidneys Continue insulin and titrate as directed for uncontrolled blood sugars.  Avoid medications that may be hard on the kidneys such as ibuprofen and aleve.  Be sure you are drinking water daily.   Follow a low carb, low saturated fat diet and be sure you are eating plenty of fresh vegetables and fiber.       Relevant Medications   atorvastatin (LIPITOR) 20 MG tablet   Allergic fungal sinusitis - Primary    Thick, green mucous in the nose and throat daily. We have treated her several times with antibiotics and the symptoms continue to return. I feel that at this time we need to get a sputum culture to see what treatment may be effective. Given the course of her recurrent symptoms, I do feel that this is fungal etiology. We discussed the option of starting medication and she is in agreement to this. She will bring a sputum culture back for me.      Lymphedema    Chronic lymphedema with worsening enlargement of her lower extremities. She has had treatment from Lymphedema clinic in the past. I do feel that she would benefit from this again. Will send referral for therapy to help with her lymphedema. Wrapping of the LE's has helped in the past.        Relevant Orders   Ambulatory referral to Physical Therapy   Class 2 severe obesity with serious comorbidity and body mass index (BMI) of 38.0 to 38.9 in adult, unspecified obesity type ([place])    Return in about 6 months (around 06/07/2023) for Med Management.  Time: 46 minutes, >50% spent counseling, care coordination, chart review, and documentation.    Worthy Keeler, DNP, AGNP-c 12/07/2022  1:45 PM

## 2022-12-07 NOTE — Patient Instructions (Addendum)
I want you to bring the sputum back by when you get a good sample so we can test this to find out what exactly is going on.   Please take care of yourself!! I am so glad to see you today.   I have sent the referral for lymphedema.   Let me know when you are finished with therapy and we will send the referral to the gym at [facility].

## 2022-12-09 ENCOUNTER — Encounter: Payer: Self-pay | Admitting: Nurse Practitioner

## 2022-12-09 ENCOUNTER — Other Ambulatory Visit (HOSPITAL_BASED_OUTPATIENT_CLINIC_OR_DEPARTMENT_OTHER): Payer: Self-pay | Admitting: Internal Medicine

## 2022-12-09 DIAGNOSIS — J329 Chronic sinusitis, unspecified: Secondary | ICD-10-CM

## 2022-12-09 DIAGNOSIS — R519 Headache, unspecified: Secondary | ICD-10-CM

## 2022-12-12 MED ORDER — FLUCONAZOLE 150 MG PO TABS: ORAL_TABLET | ORAL | 0 refills | Status: DC

## 2022-12-13 ENCOUNTER — Other Ambulatory Visit: Payer: Self-pay | Admitting: Nurse Practitioner

## 2022-12-13 DIAGNOSIS — J310 Chronic rhinitis: Secondary | ICD-10-CM

## 2022-12-14 LAB — GRAM STAIN W/SPUTUM CULT RFLX

## 2022-12-17 ENCOUNTER — Encounter: Payer: Self-pay | Admitting: Nurse Practitioner

## 2022-12-17 DIAGNOSIS — J310 Chronic rhinitis: Secondary | ICD-10-CM

## 2022-12-17 DIAGNOSIS — J329 Chronic sinusitis, unspecified: Secondary | ICD-10-CM

## 2022-12-17 DIAGNOSIS — I890 Lymphedema, not elsewhere classified: Secondary | ICD-10-CM | POA: Insufficient documentation

## 2022-12-17 NOTE — Assessment & Plan Note (Signed)
Chronic. Currently followed with cardiology. In setting of DM, HLD, CHF, and CKD her risk factors for complications are high. No alarm symptoms are present today. Her blood pressure is slightly above goal of < 130/80. Recent labs reviewed.  Recommendations: Continue with current medications.  Continue to follow closely with cardiology.  Monitor your BP at home, if you can, at least a few days a week to ensure that the levels are not staying high Work on low fat, low carb, low sodium diet. Eat plenty of fresh vegetables and make sure you are getting at least 30g of fiber a day.

## 2022-12-17 NOTE — Assessment & Plan Note (Signed)
Chronic. She is followed by cardiology. She is following a low fat, low carb diet. She is taking her medication daily without any side effects. We will plan to continue her medications at this time. Recommend Continue current medications.  I have sent refills in for you today.  Continue with your diet monitoring.

## 2022-12-17 NOTE — Assessment & Plan Note (Signed)
Chronic HF in the setting of poorly controlled DM2, resistant HTN, Stg 3 CKD, and OSA. LE edema is present at this time, however, it appears mostly to be lymphedema as there is minimal pitting present. Lungs are clear and she is not having any signs of exacerbation, which is reassuring.  Recommendations Continue with your current medications Keep good control of your blood pressure, diabetes, and sleep apnea to prevent worsening of your heart strength Let me know immediately if you start to have shortness of breath or worsening leg swelling that doesn't seem to improve after keeping your feet propped up overnight.

## 2022-12-17 NOTE — Assessment & Plan Note (Signed)
PVD with complication of lymphedema present. At this time she does not have pitting edema, but swelling is present in the LE's. Skin intact without concerns for weeping or breakdown at this time.  Recommendations: Continue to keep sodium intake low to prevent worsening swelling in the legs.  Wear compression stockings on your legs as much as possible. Keep your legs propped up throughout the day when you are sitting down to help with fluid return up to your heart.  Let me know if you have worsening swelling or if you notice skin breakdown.

## 2022-12-17 NOTE — Assessment & Plan Note (Addendum)
Chronic. Currently using a CGM which is very helpful for management of her diabetes. Blood pressures appear to be fairly well controlled at this time. She is already on [place] for diabetes, cardiovascular protection, and kidney protection.She is not eligible for GLP as she has a history of pancreatitis. She is following with endocrinology regularly.  Recommendations: Continue to monitor and manage your blood sugar and blood pressures and keep these within normal limits to prevent further damage to your kidneys Continue insulin and titrate as directed for uncontrolled blood sugars.  Avoid medications that may be hard on the kidneys such as ibuprofen and aleve.  Be sure you are drinking water daily.  Follow a low carb, low saturated fat diet and be sure you are eating plenty of fresh vegetables and fiber.

## 2022-12-17 NOTE — Assessment & Plan Note (Signed)
Chronic. She has pain control with PRN tizanidine and daily nortriptyline. At this time, I feel that it is reasonable to continue this medication as she is not having worsening of symptoms.  Recommend: Continue current medications for pain control Try to walk as much as possible to keep your joints moving to help reduce worsening pain Let me know if your pain is no longer controlled with your current treatments.

## 2022-12-17 NOTE — Assessment & Plan Note (Signed)
Chronic lymphedema with worsening enlargement of her lower extremities. She has had treatment from Lymphedema clinic in the past. I do feel that she would benefit from this again. Will send referral for therapy to help with her lymphedema. Wrapping of the LE's has helped in the past.

## 2022-12-17 NOTE — Assessment & Plan Note (Signed)
Thick, green mucous in the nose and throat daily. We have treated her several times with antibiotics and the symptoms continue to return. I feel that at this time we need to get a sputum culture to see what treatment may be effective. Given the course of her recurrent symptoms, I do feel that this is fungal etiology. We discussed the option of starting medication and she is in agreement to this. She will bring a sputum culture back for me.

## 2022-12-17 NOTE — Assessment & Plan Note (Signed)
Chronic. Currently managed with nortriptyline with no alarm symptoms at this time.  Recommend: Continue with current medications.  Keep blood sugars under control to prevent worsening of neuropathy. Let me know if the nortriptyline is not helping with your pain.

## 2022-12-18 ENCOUNTER — Telehealth: Payer: Self-pay | Admitting: Nurse Practitioner

## 2022-12-18 NOTE — Telephone Encounter (Signed)
Received a call from [facility] with [employer] and she states that patient was contacted concerned PT and pt declined services. Dollene Cleveland can be reached at [phone].

## 2022-12-19 ENCOUNTER — Other Ambulatory Visit: Payer: Self-pay | Admitting: Nurse Practitioner

## 2022-12-19 NOTE — Telephone Encounter (Signed)
From what I understand, this PT service is for lymphedema management which is what the patient was needing/wanting. Can we see if this company does lymphedema management (wrapping/compression and therapy to help reduce swelling)? If so, we need to let the patient know. If not, we will need to look at other options.

## 2022-12-19 NOTE — Telephone Encounter (Signed)
Left message for Cynthia [name] to please call me back.

## 2022-12-19 NOTE — Telephone Encounter (Signed)
Spoke with Dollene Cleveland at [employer]. They do NOT do wrapping, but they do have have compression boot so can not help with weeping or abrasions. She spoke with patient and let her know this. Patient declined referral as she told therapist Dollene Cleveland) that her swelling has gone down and she herself, knows how to wrap. She she will keep them in mind and call back if she needs them.

## 2022-12-20 NOTE — Telephone Encounter (Signed)
Thank you so much! Do we do wrapping here? I would be willing.

## 2022-12-21 ENCOUNTER — Ambulatory Visit (HOSPITAL_BASED_OUTPATIENT_CLINIC_OR_DEPARTMENT_OTHER): Payer: Self-pay | Admitting: Physical Therapy

## 2022-12-21 NOTE — Telephone Encounter (Signed)
You're welcome. Unfortunately we do not do wrapping here. [facility] on [address] has the boot if the patient would like to do the wrapping, I do know this.

## 2022-12-23 ENCOUNTER — Telehealth: Payer: Self-pay | Admitting: Nurse Practitioner

## 2022-12-23 NOTE — Telephone Encounter (Signed)
P.A. ITRACONAZOLE received, pt's medication was switched
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
## 2023-01-04 ENCOUNTER — Ambulatory Visit (HOSPITAL_BASED_OUTPATIENT_CLINIC_OR_DEPARTMENT_OTHER): Payer: PPO | Attending: Orthopaedic Surgery | Admitting: Physical Therapy

## 2023-01-04 ENCOUNTER — Encounter (HOSPITAL_BASED_OUTPATIENT_CLINIC_OR_DEPARTMENT_OTHER): Payer: Self-pay | Admitting: Physical Therapy

## 2023-01-04 ENCOUNTER — Other Ambulatory Visit: Payer: Self-pay

## 2023-01-04 DIAGNOSIS — M25612 Stiffness of left shoulder, not elsewhere classified: Secondary | ICD-10-CM | POA: Diagnosis not present

## 2023-01-04 DIAGNOSIS — M7581 Other shoulder lesions, right shoulder: Secondary | ICD-10-CM | POA: Diagnosis not present

## 2023-01-04 DIAGNOSIS — G8929 Other chronic pain: Secondary | ICD-10-CM | POA: Diagnosis not present

## 2023-01-04 DIAGNOSIS — M25511 Pain in right shoulder: Secondary | ICD-10-CM | POA: Diagnosis not present

## 2023-01-04 DIAGNOSIS — M7582 Other shoulder lesions, left shoulder: Secondary | ICD-10-CM | POA: Diagnosis not present

## 2023-01-04 DIAGNOSIS — M6281 Muscle weakness (generalized): Secondary | ICD-10-CM | POA: Diagnosis not present

## 2023-01-04 DIAGNOSIS — M25512 Pain in left shoulder: Secondary | ICD-10-CM | POA: Insufficient documentation

## 2023-01-04 DIAGNOSIS — M25611 Stiffness of right shoulder, not elsewhere classified: Secondary | ICD-10-CM | POA: Diagnosis not present

## 2023-01-08 ENCOUNTER — Other Ambulatory Visit (HOSPITAL_BASED_OUTPATIENT_CLINIC_OR_DEPARTMENT_OTHER): Payer: Self-pay | Admitting: Nurse Practitioner

## 2023-01-08 ENCOUNTER — Ambulatory Visit (HOSPITAL_BASED_OUTPATIENT_CLINIC_OR_DEPARTMENT_OTHER): Payer: PPO

## 2023-01-08 DIAGNOSIS — R519 Headache, unspecified: Secondary | ICD-10-CM

## 2023-01-08 DIAGNOSIS — I129 Hypertensive chronic kidney disease with stage 1 through stage 4 chronic kidney disease, or unspecified chronic kidney disease: Secondary | ICD-10-CM

## 2023-01-08 NOTE — Telephone Encounter (Signed)
Refill request last apt 12/07/22.

## 2023-01-18 ENCOUNTER — Encounter: Payer: Self-pay | Admitting: Nurse Practitioner

## 2023-01-29 ENCOUNTER — Other Ambulatory Visit (HOSPITAL_BASED_OUTPATIENT_CLINIC_OR_DEPARTMENT_OTHER): Payer: Self-pay | Admitting: Nurse Practitioner

## 2023-01-29 DIAGNOSIS — G4700 Insomnia, unspecified: Secondary | ICD-10-CM

## 2023-01-29 NOTE — Telephone Encounter (Signed)
Refill request last apt 11/17/22 next apt 06/07/23.

## 2023-01-31 ENCOUNTER — Encounter: Payer: Self-pay | Admitting: Nurse Practitioner

## 2023-02-14 ENCOUNTER — Telehealth: Payer: Self-pay | Admitting: Nurse Practitioner

## 2023-02-14 NOTE — Telephone Encounter (Signed)
Contacted Saavi Anjali Kleinfeldt to schedule their annual wellness visit. Appointment made for 02/19/22.  Barkley [employer] AW[facility] direct phone # [phone]

## 2023-02-15 ENCOUNTER — Encounter: Payer: Self-pay | Admitting: Nurse Practitioner

## 2023-02-19 ENCOUNTER — Encounter: Payer: Self-pay | Admitting: Nurse Practitioner

## 2023-02-19 ENCOUNTER — Other Ambulatory Visit: Payer: Self-pay

## 2023-02-19 DIAGNOSIS — I890 Lymphedema, not elsewhere classified: Secondary | ICD-10-CM

## 2023-02-20 ENCOUNTER — Ambulatory Visit (INDEPENDENT_AMBULATORY_CARE_PROVIDER_SITE_OTHER): Payer: HMO

## 2023-02-20 ENCOUNTER — Encounter (HOSPITAL_BASED_OUTPATIENT_CLINIC_OR_DEPARTMENT_OTHER): Payer: PPO | Admitting: Physical Therapy

## 2023-02-20 VITALS — Ht 66.0 in | Wt 244.0 lb

## 2023-02-20 DIAGNOSIS — Z6838 Body mass index (BMI) 38.0-38.9, adult: Secondary | ICD-10-CM

## 2023-02-20 DIAGNOSIS — M4302 Spondylolysis, cervical region: Secondary | ICD-10-CM | POA: Insufficient documentation

## 2023-02-20 DIAGNOSIS — N183 Chronic kidney disease, stage 3 unspecified: Secondary | ICD-10-CM | POA: Diagnosis not present

## 2023-02-20 DIAGNOSIS — Z794 Long term (current) use of insulin: Secondary | ICD-10-CM

## 2023-02-20 DIAGNOSIS — N1832 Chronic kidney disease, stage 3b: Secondary | ICD-10-CM | POA: Diagnosis not present

## 2023-02-20 DIAGNOSIS — M1712 Unilateral primary osteoarthritis, left knee: Secondary | ICD-10-CM | POA: Insufficient documentation

## 2023-02-20 DIAGNOSIS — Z780 Asymptomatic menopausal state: Secondary | ICD-10-CM | POA: Diagnosis not present

## 2023-02-20 DIAGNOSIS — I890 Lymphedema, not elsewhere classified: Secondary | ICD-10-CM

## 2023-02-20 DIAGNOSIS — M1711 Unilateral primary osteoarthritis, right knee: Secondary | ICD-10-CM | POA: Insufficient documentation

## 2023-02-20 DIAGNOSIS — Z0000 Encounter for general adult medical examination without abnormal findings: Secondary | ICD-10-CM

## 2023-02-20 DIAGNOSIS — E1122 Type 2 diabetes mellitus with diabetic chronic kidney disease: Secondary | ICD-10-CM

## 2023-02-20 NOTE — Patient Instructions (Signed)
Cynthia [name] , Thank you for taking time to come for your Medicare Wellness Visit. I appreciate your ongoing commitment to your health goals. Please review the following plan we discussed and let me know if I can assist you in the future.   These are the goals we discussed:  Goals      Patient Stated     Get back to exercise program - get lymphedema under control - get healthier.        This is a list of the screening recommended for you and due dates:  Health Maintenance  Topic Date Due   DTaP/Tdap/Td vaccine (1 - Tdap) Never done   Zoster (Shingles) Vaccine (1 of 2) Never done   Pneumonia Vaccine (1 of 1 - PCV) Never done   DEXA scan (bone density measurement)  Never done   Eye exam for diabetics  10/26/2021   COVID-19 Vaccine (4 - 2023-24 season) 07/14/2022   Complete foot exam   09/06/2022   Hemoglobin A1C  09/16/2022   Yearly kidney health urinalysis for diabetes  03/17/2023   Flu Shot  06/14/2023   Yearly kidney function blood test for diabetes  08/02/2023   Mammogram  08/22/2023   Medicare Annual Wellness Visit  02/20/2024   Colon Cancer Screening  09/19/2026   Hepatitis C Screening: USPSTF Recommendation to screen - Ages 18-79 yo.  Completed   HPV Vaccine  Aged Out    Advanced directives: Please bring a copy of your health care power of attorney and living will to the office to be added to your chart at your convenience.   Conditions/risks identified: Each day, aim for 6 glasses of water, plenty of protein in your diet and try to get up and walk/ stretch every hour for 5-10 minutes at a time.    Next appointment: Follow up in one year for your annual wellness visit    Preventive Care 65 Years and Older, Female Preventive care refers to lifestyle choices and visits with your health care provider that can promote health and wellness. What does preventive care include? A yearly physical exam. This is also called an annual well check. Dental exams once or twice a  year. Routine eye exams. Ask your health care provider how often you should have your eyes checked. Personal lifestyle choices, including: Daily care of your teeth and gums. Regular physical activity. Eating a healthy diet. Avoiding tobacco and drug use. Limiting alcohol use. Practicing safe sex. Taking low-dose aspirin every day. Taking vitamin and mineral supplements as recommended by your health care provider. What happens during an annual well check? The services and screenings done by your health care provider during your annual well check will depend on your age, overall health, lifestyle risk factors, and family history of disease. Counseling  Your health care provider may ask you questions about your: Alcohol use. Tobacco use. Drug use. Emotional well-being. Home and relationship well-being. Sexual activity. Eating habits. History of falls. Memory and ability to understand (cognition). Work and work Astronomer. Reproductive health. Screening  You may have the following tests or measurements: Height, weight, and BMI. Blood pressure. Lipid and cholesterol levels. These may be checked every 5 years, or more frequently if you are over 25 years old. Skin check. Lung cancer screening. You may have this screening every year starting at age 89 if you have a 30-pack-year history of smoking and currently smoke or have quit within the past 15 years. Fecal occult blood test (FOBT) of the stool.  You may have this test every year starting at age 34. Flexible sigmoidoscopy or colonoscopy. You may have a sigmoidoscopy every 5 years or a colonoscopy every 10 years starting at age 40. Hepatitis C blood test. Hepatitis B blood test. Sexually transmitted disease (STD) testing. Diabetes screening. This is done by checking your blood sugar (glucose) after you have not eaten for a while (fasting). You may have this done every 1-3 years. Bone density scan. This is done to screen for  osteoporosis. You may have this done starting at age 65. Mammogram. This may be done every 1-2 years. Talk to your health care provider about how often you should have regular mammograms. Talk with your health care provider about your test results, treatment options, and if necessary, the need for more tests. Vaccines  Your health care provider may recommend certain vaccines, such as: Influenza vaccine. This is recommended every year. Tetanus, diphtheria, and acellular pertussis (Tdap, Td) vaccine. You may need a Td booster every 10 years. Zoster vaccine. You may need this after age 41. Pneumococcal 13-valent conjugate (PCV13) vaccine. One dose is recommended after age 63. Pneumococcal polysaccharide (PPSV23) vaccine. One dose is recommended after age 29. Talk to your health care provider about which screenings and vaccines you need and how often you need them. This information is not intended to replace advice given to you by your health care provider. Make sure you discuss any questions you have with your health care provider. Document Released: 11/26/2015 Document Revised: 07/19/2016 Document Reviewed: 08/31/2015 Elsevier Interactive Patient Education  2017 [place] Prevention in the Home Falls can cause injuries. They can happen to people of all ages. There are many things you can do to make your home safe and to help prevent falls. What can I do on the outside of my home? Regularly fix the edges of walkways and driveways and fix any cracks. Remove anything that might make you trip as you walk through a door, such as a raised step or threshold. Trim any bushes or trees on the path to your home. Use bright outdoor lighting. Clear any walking paths of anything that might make someone trip, such as rocks or tools. Regularly check to see if handrails are loose or broken. Make sure that both sides of any steps have handrails. Any raised decks and porches should have guardrails on  the edges. Have any leaves, snow, or ice cleared regularly. Use sand or salt on walking paths during winter. Clean up any spills in your garage right away. This includes oil or grease spills. What can I do in the bathroom? Use night lights. Install grab bars by the toilet and in the tub and shower. Do not use towel bars as grab bars. Use non-skid mats or decals in the tub or shower. If you need to sit down in the shower, use a plastic, non-slip stool. Keep the floor dry. Clean up any water that spills on the floor as soon as it happens. Remove soap buildup in the tub or shower regularly. Attach bath mats securely with double-sided non-slip rug tape. Do not have throw rugs and other things on the floor that can make you trip. What can I do in the bedroom? Use night lights. Make sure that you have a light by your bed that is easy to reach. Do not use any sheets or blankets that are too big for your bed. They should not hang down onto the floor. Have a firm chair that has  side arms. You can use this for support while you get dressed. Do not have throw rugs and other things on the floor that can make you trip. What can I do in the kitchen? Clean up any spills right away. Avoid walking on wet floors. Keep items that you use a lot in easy-to-reach places. If you need to reach something above you, use a strong step stool that has a grab bar. Keep electrical cords out of the way. Do not use floor polish or wax that makes floors slippery. If you must use wax, use non-skid floor wax. Do not have throw rugs and other things on the floor that can make you trip. What can I do with my stairs? Do not leave any items on the stairs. Make sure that there are handrails on both sides of the stairs and use them. Fix handrails that are broken or loose. Make sure that handrails are as long as the stairways. Check any carpeting to make sure that it is firmly attached to the stairs. Fix any carpet that is loose  or worn. Avoid having throw rugs at the top or bottom of the stairs. If you do have throw rugs, attach them to the floor with carpet tape. Make sure that you have a light switch at the top of the stairs and the bottom of the stairs. If you do not have them, ask someone to add them for you. What else can I do to help prevent falls? Wear shoes that: Do not have high heels. Have rubber bottoms. Are comfortable and fit you well. Are closed at the toe. Do not wear sandals. If you use a stepladder: Make sure that it is fully opened. Do not climb a closed stepladder. Make sure that both sides of the stepladder are locked into place. Ask someone to hold it for you, if possible. Clearly mark and make sure that you can see: Any grab bars or handrails. First and last steps. Where the edge of each step is. Use tools that help you move around (mobility aids) if they are needed. These include: Canes. Walkers. Scooters. Crutches. Turn on the lights when you go into a dark area. Replace any light bulbs as soon as they burn out. Set up your furniture so you have a clear path. Avoid moving your furniture around. If any of your floors are uneven, fix them. If there are any pets around you, be aware of where they are. Review your medicines with your doctor. Some medicines can make you feel dizzy. This can increase your chance of falling. Ask your doctor what other things that you can do to help prevent falls. This information is not intended to replace advice given to you by your health care provider. Make sure you discuss any questions you have with your health care provider. Document Released: 08/26/2009 Document Revised: 04/06/2016 Document Reviewed: 12/04/2014 Elsevier Interactive Patient Education  2017 [employer].
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
## 2023-02-21 ENCOUNTER — Encounter: Payer: Self-pay | Admitting: Occupational Therapy

## 2023-02-21 ENCOUNTER — Ambulatory Visit: Payer: PPO | Attending: Nurse Practitioner | Admitting: Occupational Therapy

## 2023-02-21 DIAGNOSIS — I890 Lymphedema, not elsewhere classified: Secondary | ICD-10-CM | POA: Diagnosis present
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
## 2023-02-22 ENCOUNTER — Ambulatory Visit (HOSPITAL_BASED_OUTPATIENT_CLINIC_OR_DEPARTMENT_OTHER): Payer: PPO | Admitting: Physical Therapy

## 2023-02-23 ENCOUNTER — Encounter: Payer: Self-pay | Admitting: Occupational Therapy

## 2023-02-23 ENCOUNTER — Encounter: Payer: Self-pay | Admitting: Nurse Practitioner

## 2023-02-26 ENCOUNTER — Ambulatory Visit: Payer: PPO | Admitting: Occupational Therapy

## 2023-02-26 ENCOUNTER — Other Ambulatory Visit: Payer: Self-pay | Admitting: Nurse Practitioner

## 2023-02-26 DIAGNOSIS — I890 Lymphedema, not elsewhere classified: Secondary | ICD-10-CM

## 2023-02-26 DIAGNOSIS — E1159 Type 2 diabetes mellitus with other circulatory complications: Secondary | ICD-10-CM

## 2023-02-26 DIAGNOSIS — N1832 Chronic kidney disease, stage 3b: Secondary | ICD-10-CM

## 2023-02-26 DIAGNOSIS — Z794 Long term (current) use of insulin: Secondary | ICD-10-CM

## 2023-02-26 DIAGNOSIS — I5032 Chronic diastolic (congestive) heart failure: Secondary | ICD-10-CM

## 2023-02-26 MED ORDER — VERAPAMIL HCL ER 180 MG PO TBCR: 180.0000 mg | EXTENDED_RELEASE_TABLET | Freq: Every day | ORAL | 0 refills | Status: DC
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
## 2023-02-27 ENCOUNTER — Ambulatory Visit (HOSPITAL_BASED_OUTPATIENT_CLINIC_OR_DEPARTMENT_OTHER): Payer: PPO | Attending: Orthopaedic Surgery | Admitting: Physical Therapy

## 2023-02-27 ENCOUNTER — Encounter (HOSPITAL_BASED_OUTPATIENT_CLINIC_OR_DEPARTMENT_OTHER): Payer: Self-pay | Admitting: Physical Therapy

## 2023-02-27 DIAGNOSIS — M25612 Stiffness of left shoulder, not elsewhere classified: Secondary | ICD-10-CM | POA: Diagnosis present

## 2023-02-27 DIAGNOSIS — M25511 Pain in right shoulder: Secondary | ICD-10-CM | POA: Insufficient documentation

## 2023-02-27 DIAGNOSIS — G8929 Other chronic pain: Secondary | ICD-10-CM | POA: Insufficient documentation

## 2023-02-27 DIAGNOSIS — M25611 Stiffness of right shoulder, not elsewhere classified: Secondary | ICD-10-CM | POA: Insufficient documentation

## 2023-02-27 DIAGNOSIS — M25512 Pain in left shoulder: Secondary | ICD-10-CM | POA: Diagnosis present

## 2023-02-27 DIAGNOSIS — M6281 Muscle weakness (generalized): Secondary | ICD-10-CM | POA: Diagnosis present
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
## 2023-03-01 ENCOUNTER — Encounter (HOSPITAL_BASED_OUTPATIENT_CLINIC_OR_DEPARTMENT_OTHER): Payer: Self-pay | Admitting: Physical Therapy

## 2023-03-01 ENCOUNTER — Ambulatory Visit (HOSPITAL_BASED_OUTPATIENT_CLINIC_OR_DEPARTMENT_OTHER): Payer: PPO | Admitting: Physical Therapy

## 2023-03-01 DIAGNOSIS — M6281 Muscle weakness (generalized): Secondary | ICD-10-CM

## 2023-03-01 DIAGNOSIS — M25611 Stiffness of right shoulder, not elsewhere classified: Secondary | ICD-10-CM | POA: Diagnosis not present

## 2023-03-01 DIAGNOSIS — M25612 Stiffness of left shoulder, not elsewhere classified: Secondary | ICD-10-CM
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
## 2023-03-02 ENCOUNTER — Ambulatory Visit: Payer: PPO | Admitting: Occupational Therapy

## 2023-03-02 ENCOUNTER — Encounter: Payer: Self-pay | Admitting: Occupational Therapy

## 2023-03-02 DIAGNOSIS — I890 Lymphedema, not elsewhere classified: Secondary | ICD-10-CM | POA: Diagnosis not present
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
## 2023-03-05 ENCOUNTER — Encounter: Payer: Self-pay | Admitting: Occupational Therapy

## 2023-03-05 ENCOUNTER — Ambulatory Visit: Payer: PPO | Admitting: Occupational Therapy

## 2023-03-05 DIAGNOSIS — I890 Lymphedema, not elsewhere classified: Secondary | ICD-10-CM | POA: Diagnosis not present
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
## 2023-03-06 ENCOUNTER — Encounter: Payer: Self-pay | Admitting: Nurse Practitioner

## 2023-03-06 ENCOUNTER — Ambulatory Visit (HOSPITAL_BASED_OUTPATIENT_CLINIC_OR_DEPARTMENT_OTHER): Payer: PPO | Admitting: Physical Therapy

## 2023-03-06 IMAGING — DX DG HAND COMPLETE 3+V*R*
3 series · 3 of 3 positions shown · non-contrast
Comparison: None.

CLINICAL DATA: On going right hand pain and swelling

EXAM:
RIGHT HAND - COMPLETE 3+ VIEW

[hand pa]
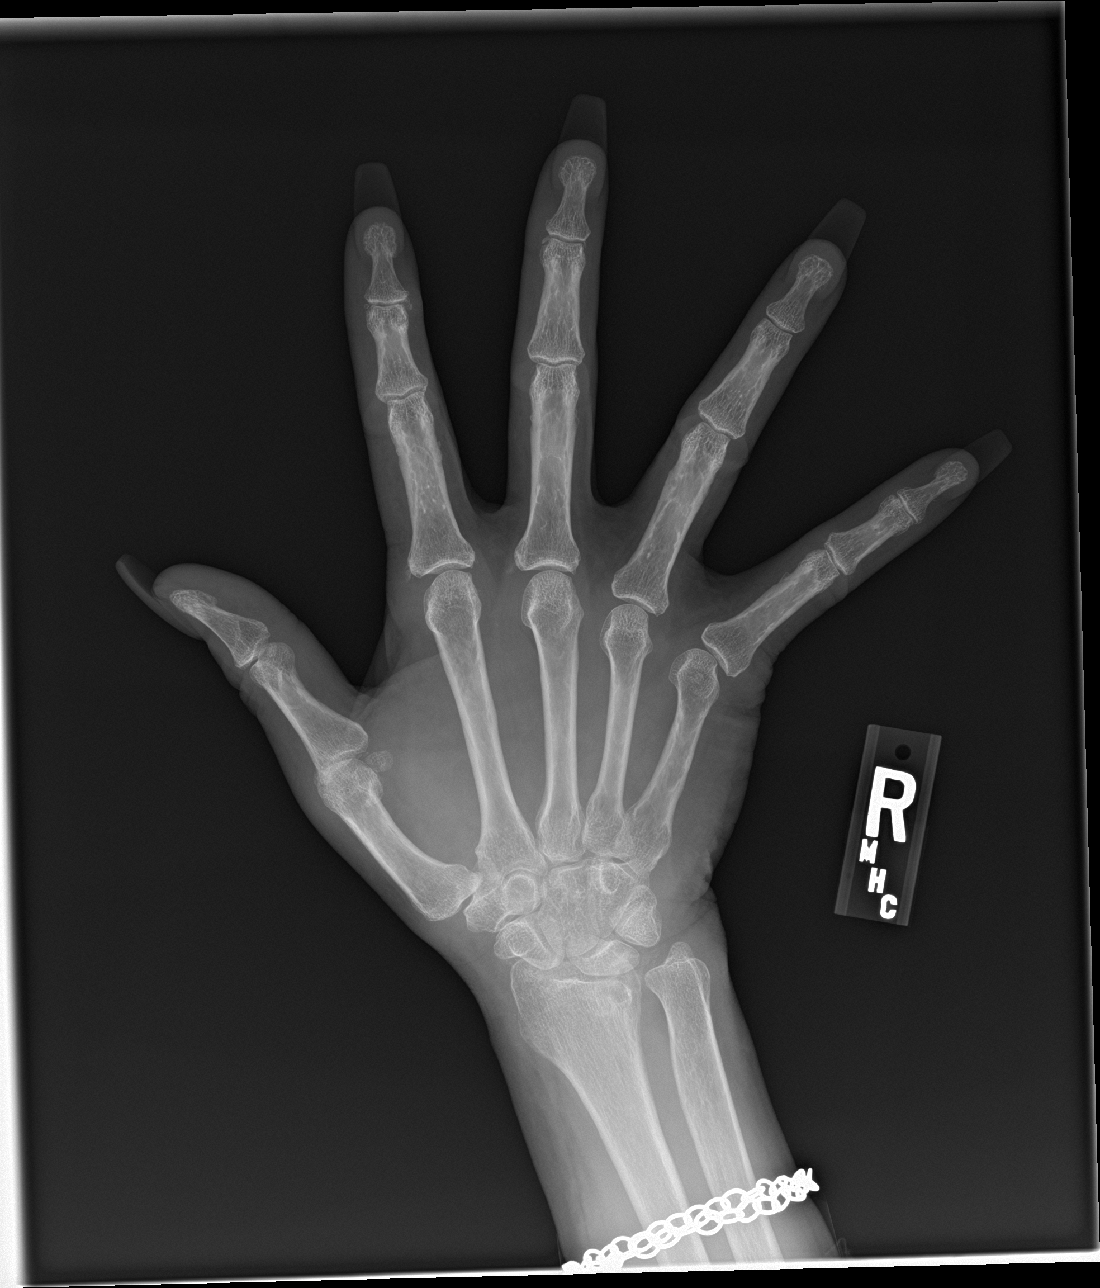

[hand obl]
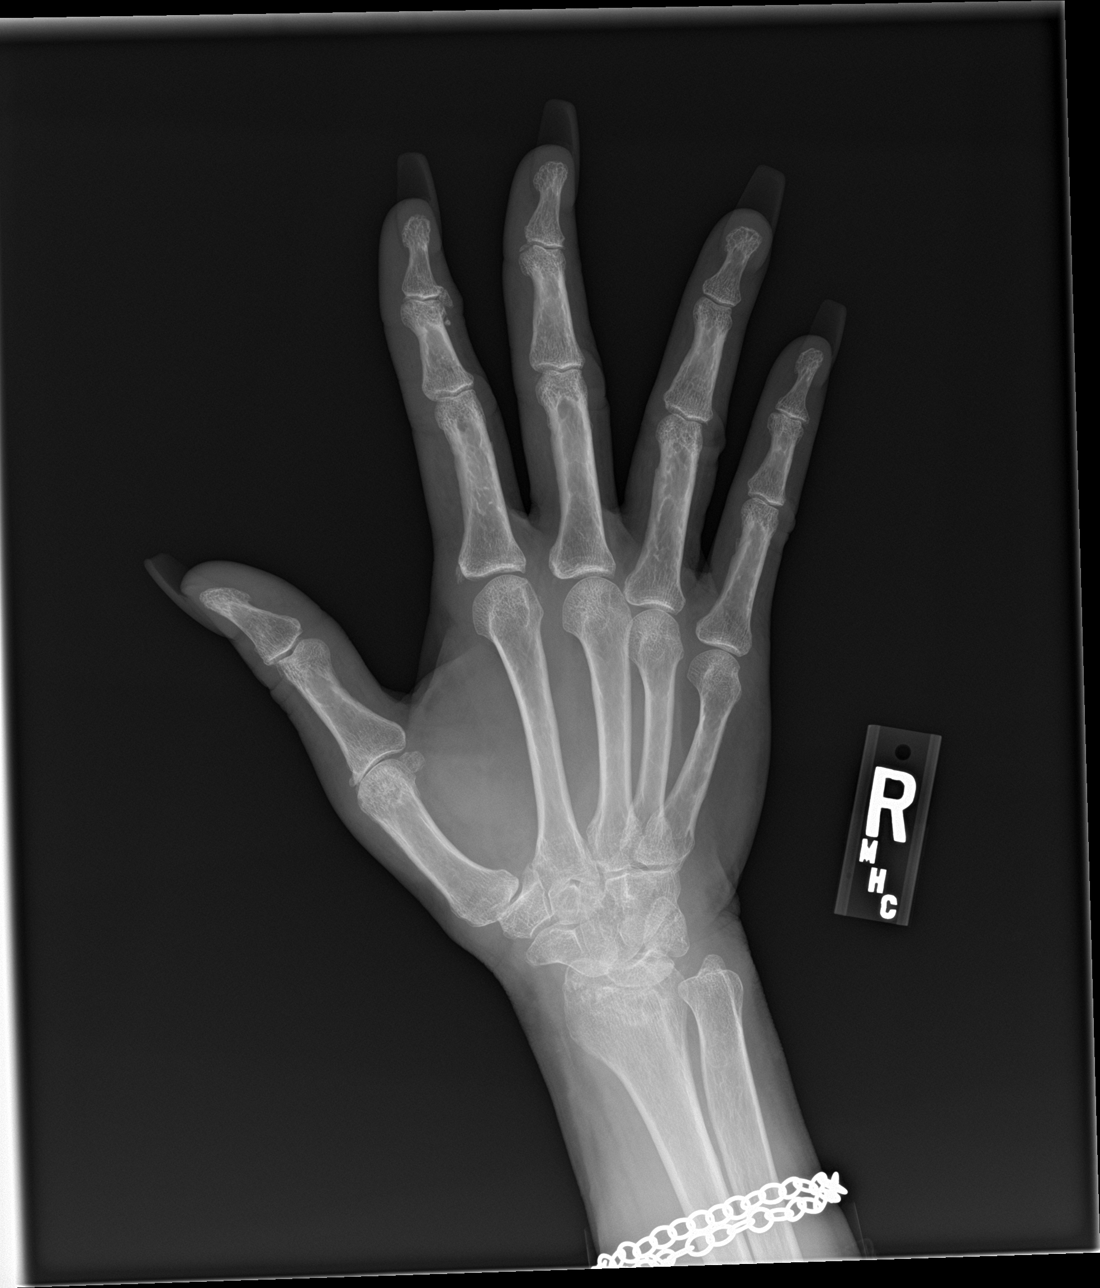

[hand lat]
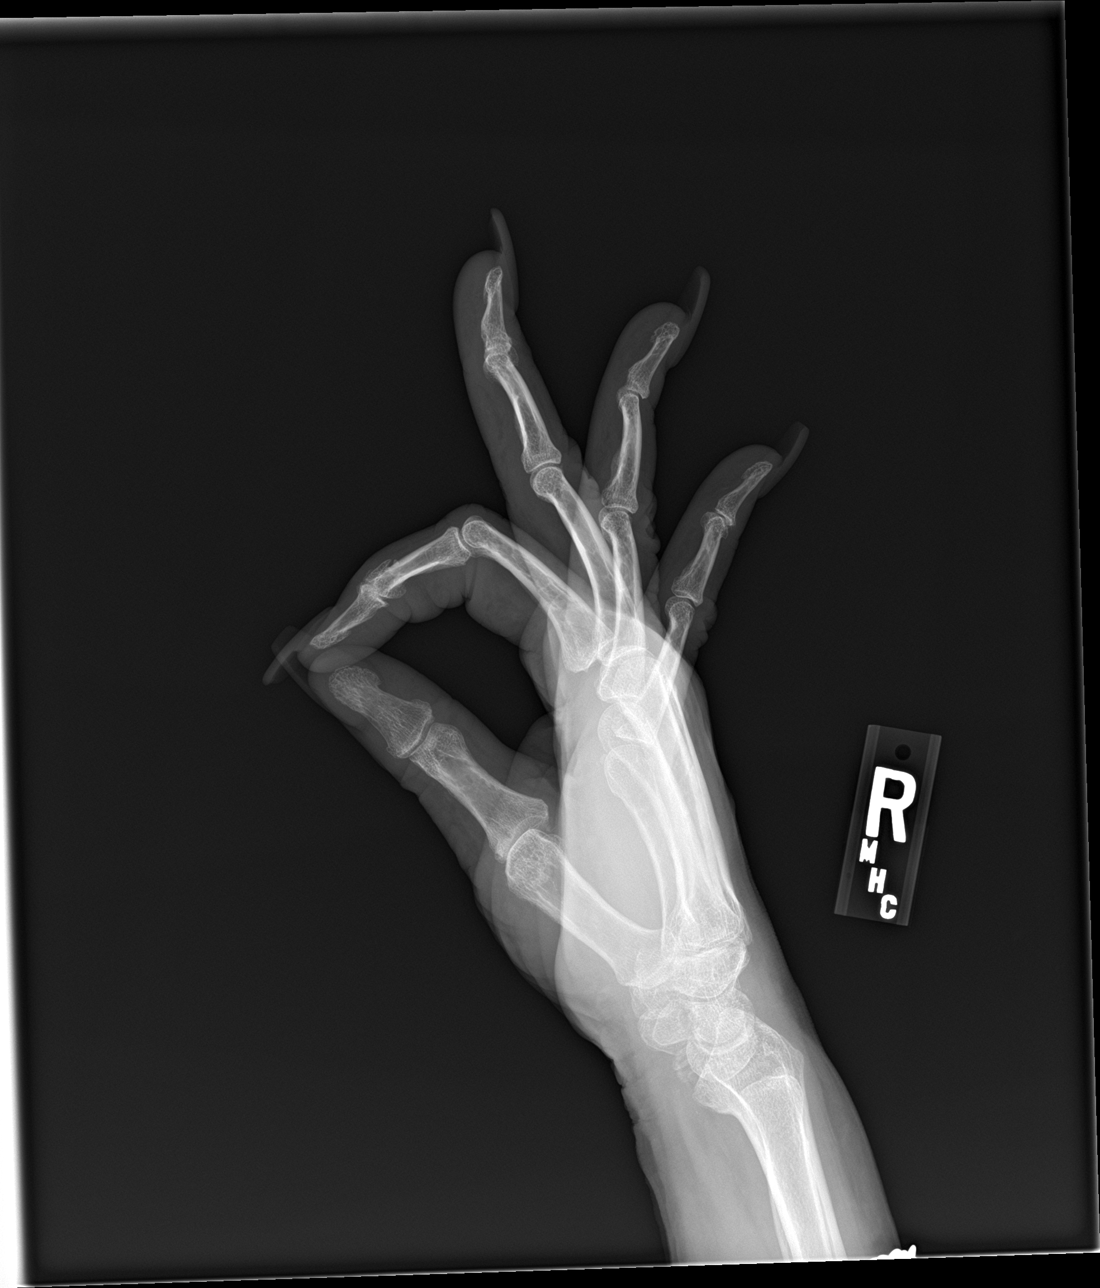

[3 of 3 positions shown; findings below may reference images not displayed]

FINDINGS: No fracture or dislocation.  Soft tissues are unremarkable.

Mild joint space loss and spurring at the first metacarpophalangeal
joint. Mild dorsal subluxation of the proximal phalanx of the thumb
relative to the first metacarpal. Degenerative changes seen
throughout the interphalangeal joints, greatest at the D IP joint of
the index finger.

Increased dorsal tilt of the lunate.
IMPRESSION: 1. No acute fracture or dislocation of the right hand.
2. Multi joint degenerative changes as above.

## 2023-03-07 ENCOUNTER — Encounter: Payer: PPO | Admitting: Occupational Therapy

## 2023-03-08 ENCOUNTER — Other Ambulatory Visit: Payer: Self-pay

## 2023-03-08 ENCOUNTER — Encounter (HOSPITAL_BASED_OUTPATIENT_CLINIC_OR_DEPARTMENT_OTHER): Payer: Self-pay | Admitting: Physical Therapy

## 2023-03-08 ENCOUNTER — Ambulatory Visit (HOSPITAL_BASED_OUTPATIENT_CLINIC_OR_DEPARTMENT_OTHER): Payer: PPO | Admitting: Physical Therapy

## 2023-03-08 DIAGNOSIS — M25611 Stiffness of right shoulder, not elsewhere classified: Secondary | ICD-10-CM

## 2023-03-08 DIAGNOSIS — I890 Lymphedema, not elsewhere classified: Secondary | ICD-10-CM

## 2023-03-08 DIAGNOSIS — M25612 Stiffness of left shoulder, not elsewhere classified: Secondary | ICD-10-CM

## 2023-03-08 DIAGNOSIS — N1832 Chronic kidney disease, stage 3b: Secondary | ICD-10-CM

## 2023-03-08 DIAGNOSIS — M6281 Muscle weakness (generalized): Secondary | ICD-10-CM

## 2023-03-08 DIAGNOSIS — G8929 Other chronic pain: Secondary | ICD-10-CM
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
## 2023-03-09 ENCOUNTER — Telehealth: Payer: Self-pay | Admitting: Nurse Practitioner

## 2023-03-09 ENCOUNTER — Ambulatory Visit: Payer: PPO | Admitting: Occupational Therapy

## 2023-03-09 NOTE — Telephone Encounter (Signed)
Form was dropped off for a placard renewal, was put in [place] folder

## 2023-03-14 ENCOUNTER — Ambulatory Visit (HOSPITAL_BASED_OUTPATIENT_CLINIC_OR_DEPARTMENT_OTHER): Payer: PPO | Attending: Orthopaedic Surgery | Admitting: Physical Therapy

## 2023-03-14 DIAGNOSIS — M6281 Muscle weakness (generalized): Secondary | ICD-10-CM | POA: Insufficient documentation

## 2023-03-14 DIAGNOSIS — M25511 Pain in right shoulder: Secondary | ICD-10-CM | POA: Insufficient documentation

## 2023-03-14 DIAGNOSIS — M25611 Stiffness of right shoulder, not elsewhere classified: Secondary | ICD-10-CM | POA: Insufficient documentation

## 2023-03-14 DIAGNOSIS — G8929 Other chronic pain: Secondary | ICD-10-CM | POA: Insufficient documentation

## 2023-03-14 DIAGNOSIS — M25512 Pain in left shoulder: Secondary | ICD-10-CM | POA: Insufficient documentation

## 2023-03-14 DIAGNOSIS — M25612 Stiffness of left shoulder, not elsewhere classified: Secondary | ICD-10-CM | POA: Insufficient documentation

## 2023-03-14 DIAGNOSIS — I890 Lymphedema, not elsewhere classified: Secondary | ICD-10-CM | POA: Insufficient documentation

## 2023-03-15 ENCOUNTER — Ambulatory Visit: Payer: PPO | Attending: Nurse Practitioner | Admitting: Occupational Therapy

## 2023-03-15 ENCOUNTER — Encounter: Payer: Self-pay | Admitting: Occupational Therapy

## 2023-03-15 DIAGNOSIS — I890 Lymphedema, not elsewhere classified: Secondary | ICD-10-CM
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
## 2023-03-16 ENCOUNTER — Ambulatory Visit: Payer: PPO | Admitting: Occupational Therapy

## 2023-03-19 ENCOUNTER — Ambulatory Visit: Payer: PPO | Admitting: Occupational Therapy

## 2023-03-19 DIAGNOSIS — I890 Lymphedema, not elsewhere classified: Secondary | ICD-10-CM
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
## 2023-03-22 ENCOUNTER — Ambulatory Visit: Payer: PPO | Admitting: Occupational Therapy

## 2023-03-22 DIAGNOSIS — I890 Lymphedema, not elsewhere classified: Secondary | ICD-10-CM | POA: Diagnosis not present
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
## 2023-03-23 ENCOUNTER — Ambulatory Visit (HOSPITAL_BASED_OUTPATIENT_CLINIC_OR_DEPARTMENT_OTHER): Payer: PPO | Admitting: Physical Therapy

## 2023-03-23 ENCOUNTER — Encounter (HOSPITAL_BASED_OUTPATIENT_CLINIC_OR_DEPARTMENT_OTHER): Payer: Self-pay | Admitting: Physical Therapy

## 2023-03-23 DIAGNOSIS — M6281 Muscle weakness (generalized): Secondary | ICD-10-CM

## 2023-03-23 DIAGNOSIS — M25611 Stiffness of right shoulder, not elsewhere classified: Secondary | ICD-10-CM

## 2023-03-23 DIAGNOSIS — M25512 Pain in left shoulder: Secondary | ICD-10-CM | POA: Diagnosis present

## 2023-03-23 DIAGNOSIS — G8929 Other chronic pain: Secondary | ICD-10-CM | POA: Diagnosis present

## 2023-03-23 DIAGNOSIS — M25511 Pain in right shoulder: Secondary | ICD-10-CM | POA: Diagnosis present

## 2023-03-23 DIAGNOSIS — M25612 Stiffness of left shoulder, not elsewhere classified: Secondary | ICD-10-CM

## 2023-03-23 DIAGNOSIS — I890 Lymphedema, not elsewhere classified: Secondary | ICD-10-CM | POA: Diagnosis present
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
## 2023-03-26 ENCOUNTER — Ambulatory Visit: Payer: PPO | Admitting: Occupational Therapy

## 2023-03-26 DIAGNOSIS — I890 Lymphedema, not elsewhere classified: Secondary | ICD-10-CM
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
## 2023-03-27 ENCOUNTER — Ambulatory Visit (INDEPENDENT_AMBULATORY_CARE_PROVIDER_SITE_OTHER): Payer: PPO | Admitting: Internal Medicine

## 2023-03-27 ENCOUNTER — Encounter (HOSPITAL_BASED_OUTPATIENT_CLINIC_OR_DEPARTMENT_OTHER): Payer: Self-pay

## 2023-03-27 ENCOUNTER — Ambulatory Visit (HOSPITAL_BASED_OUTPATIENT_CLINIC_OR_DEPARTMENT_OTHER): Payer: PPO

## 2023-03-27 ENCOUNTER — Encounter: Payer: Self-pay | Admitting: Internal Medicine

## 2023-03-27 VITALS — BP 128/80 | HR 88 | Ht 66.0 in | Wt 243.7 lb

## 2023-03-27 DIAGNOSIS — E1149 Type 2 diabetes mellitus with other diabetic neurological complication: Secondary | ICD-10-CM

## 2023-03-27 DIAGNOSIS — M6281 Muscle weakness (generalized): Secondary | ICD-10-CM

## 2023-03-27 DIAGNOSIS — E1165 Type 2 diabetes mellitus with hyperglycemia: Secondary | ICD-10-CM | POA: Diagnosis not present

## 2023-03-27 DIAGNOSIS — G8929 Other chronic pain: Secondary | ICD-10-CM

## 2023-03-27 DIAGNOSIS — Z794 Long term (current) use of insulin: Secondary | ICD-10-CM | POA: Diagnosis not present

## 2023-03-27 DIAGNOSIS — I890 Lymphedema, not elsewhere classified: Secondary | ICD-10-CM

## 2023-03-27 DIAGNOSIS — M25611 Stiffness of right shoulder, not elsewhere classified: Secondary | ICD-10-CM

## 2023-03-27 DIAGNOSIS — M25612 Stiffness of left shoulder, not elsewhere classified: Secondary | ICD-10-CM

## 2023-03-27 LAB — POCT GLYCOSYLATED HEMOGLOBIN (HGB A1C): Hemoglobin A1C: 7.3 % — AB (ref 4.0–5.6)

## 2023-03-27 LAB — POCT GLUCOSE (DEVICE FOR HOME USE): Glucose Fasting, POC: 106 mg/dL — AB (ref 70–99)

## 2023-03-27 MED ORDER — DAPAGLIFLOZIN PROPANEDIOL 10 MG PO TABS: 10.0000 mg | ORAL_TABLET | Freq: Every day | ORAL | 3 refills | Status: DC

## 2023-03-27 MED ORDER — INSULIN PEN NEEDLE 32G X 4 MM MISC: 1.0000 | Freq: Four times a day (QID) | 3 refills | Status: AC

## 2023-03-27 MED ORDER — NOVOLOG FLEXPEN 100 UNIT/ML ~~LOC~~ SOPN: PEN_INJECTOR | SUBCUTANEOUS | 3 refills | Status: DC

## 2023-03-27 MED ORDER — LANTUS SOLOSTAR 100 UNIT/ML ~~LOC~~ SOPN: 26.0000 [IU] | PEN_INJECTOR | Freq: Every day | SUBCUTANEOUS | 3 refills | Status: DC

## 2023-03-27 MED ORDER — DEXCOM G7 SENSOR MISC: 1.0000 | 3 refills | Status: DC
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
## 2023-03-27 NOTE — Patient Instructions (Signed)
-   Continue Farxiga 10 mg daily  - Continue Lantus  26 units daily  - Take Novolog 8 units with each meal  - Novolog correctional insulin: ADD extra units on insulin to your meal-time Novolog dose if your blood sugars are higher than 160. Use the scale below to help guide you Three times a day ( breakfast, lunch and Supper)  Blood sugar before meal Number of units to inject  Less than 160 0 unit  161 -  190 1 units  191 -  220 2 units  221 -  250 3 units  251 -  280 4 units  281 -  310 5 units  311 -  340 6 units  341 -  370 7 units     HOW TO TREAT LOW BLOOD SUGARS (Blood sugar LESS THAN 70 MG/DL) Please follow the RULE OF 15 for the treatment of hypoglycemia treatment (when your (blood sugars are less than 70 mg/dL)   STEP 1: Take 15 grams of carbohydrates when your blood sugar is low, which includes:  3-4 GLUCOSE TABS  OR 3-4 OZ OF JUICE OR REGULAR SODA OR ONE TUBE OF GLUCOSE GEL    STEP 2: RECHECK blood sugar in 15 MINUTES STEP 3: If your blood sugar is still low at the 15 minute recheck --> then, go back to STEP 1 and treat AGAIN with another 15 grams of carbohydrates.
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
## 2023-03-29 ENCOUNTER — Ambulatory Visit: Payer: PPO | Admitting: Occupational Therapy

## 2023-03-30 ENCOUNTER — Encounter: Payer: Self-pay | Admitting: Internal Medicine

## 2023-04-02 ENCOUNTER — Ambulatory Visit: Payer: PPO | Admitting: Occupational Therapy

## 2023-04-02 ENCOUNTER — Encounter: Payer: Self-pay | Admitting: Nurse Practitioner

## 2023-04-02 ENCOUNTER — Encounter: Payer: Self-pay | Admitting: Occupational Therapy

## 2023-04-02 DIAGNOSIS — I890 Lymphedema, not elsewhere classified: Secondary | ICD-10-CM | POA: Diagnosis not present
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
## 2023-04-03 ENCOUNTER — Encounter (HOSPITAL_BASED_OUTPATIENT_CLINIC_OR_DEPARTMENT_OTHER): Payer: Self-pay

## 2023-04-03 ENCOUNTER — Ambulatory Visit (HOSPITAL_BASED_OUTPATIENT_CLINIC_OR_DEPARTMENT_OTHER): Payer: PPO

## 2023-04-03 DIAGNOSIS — I130 Hypertensive heart and chronic kidney disease with heart failure and stage 1 through stage 4 chronic kidney disease, or unspecified chronic kidney disease: Secondary | ICD-10-CM | POA: Diagnosis not present

## 2023-04-03 DIAGNOSIS — I890 Lymphedema, not elsewhere classified: Secondary | ICD-10-CM | POA: Diagnosis not present

## 2023-04-03 DIAGNOSIS — E1142 Type 2 diabetes mellitus with diabetic polyneuropathy: Secondary | ICD-10-CM | POA: Diagnosis not present

## 2023-04-03 DIAGNOSIS — E1169 Type 2 diabetes mellitus with other specified complication: Secondary | ICD-10-CM | POA: Diagnosis not present

## 2023-04-03 DIAGNOSIS — E1165 Type 2 diabetes mellitus with hyperglycemia: Secondary | ICD-10-CM | POA: Diagnosis not present

## 2023-04-03 DIAGNOSIS — N183 Chronic kidney disease, stage 3 unspecified: Secondary | ICD-10-CM | POA: Diagnosis not present

## 2023-04-03 DIAGNOSIS — D8481 Immunodeficiency due to conditions classified elsewhere: Secondary | ICD-10-CM | POA: Diagnosis not present

## 2023-04-03 DIAGNOSIS — E261 Secondary hyperaldosteronism: Secondary | ICD-10-CM | POA: Diagnosis not present

## 2023-04-03 DIAGNOSIS — M25611 Stiffness of right shoulder, not elsewhere classified: Secondary | ICD-10-CM

## 2023-04-03 DIAGNOSIS — G8929 Other chronic pain: Secondary | ICD-10-CM

## 2023-04-03 DIAGNOSIS — E1122 Type 2 diabetes mellitus with diabetic chronic kidney disease: Secondary | ICD-10-CM | POA: Diagnosis not present

## 2023-04-03 DIAGNOSIS — M6281 Muscle weakness (generalized): Secondary | ICD-10-CM

## 2023-04-03 DIAGNOSIS — M25612 Stiffness of left shoulder, not elsewhere classified: Secondary | ICD-10-CM

## 2023-04-03 DIAGNOSIS — F330 Major depressive disorder, recurrent, mild: Secondary | ICD-10-CM | POA: Diagnosis not present

## 2023-04-03 DIAGNOSIS — E11649 Type 2 diabetes mellitus with hypoglycemia without coma: Secondary | ICD-10-CM | POA: Diagnosis not present

## 2023-04-03 DIAGNOSIS — I509 Heart failure, unspecified: Secondary | ICD-10-CM | POA: Diagnosis not present
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
## 2023-04-04 ENCOUNTER — Encounter (HOSPITAL_BASED_OUTPATIENT_CLINIC_OR_DEPARTMENT_OTHER): Payer: Self-pay | Admitting: Cardiovascular Disease

## 2023-04-04 ENCOUNTER — Ambulatory Visit (INDEPENDENT_AMBULATORY_CARE_PROVIDER_SITE_OTHER): Payer: PPO | Admitting: Cardiovascular Disease

## 2023-04-04 VITALS — BP 162/76 | HR 57 | Ht 66.0 in | Wt 243.0 lb

## 2023-04-04 DIAGNOSIS — R609 Edema, unspecified: Secondary | ICD-10-CM

## 2023-04-04 DIAGNOSIS — I5032 Chronic diastolic (congestive) heart failure: Secondary | ICD-10-CM

## 2023-04-04 MED ORDER — HYDRALAZINE HCL 50 MG PO TABS: 50.0000 mg | ORAL_TABLET | Freq: Two times a day (BID) | ORAL | 3 refills | Status: DC
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
## 2023-04-04 NOTE — Patient Instructions (Addendum)
Medication Instructions:  STOP VERAPAMIL   START HYDRALAZINE 50 MG MORNING AND EVENING (12 HOURS APART)   Labwork: NONE  Testing/Procedures: Your physician has requested that you have an echocardiogram. Echocardiography is a painless test that uses sound waves to create images of your heart. It provides your doctor with information about the size and shape of your heart and how well your heart's chambers and valves are working. This procedure takes approximately one hour. There are no restrictions for this procedure. Please do NOT wear cologne, perfume, aftershave, or lotions (deodorant is allowed). Please arrive 15 minutes prior to your appointment time.  Follow-Up: 06/06/2023 10:00 AM WITH PHARM D   09/26/2023 8:15 AM WITH DR    You have been referred to [facility]   Any Other Special Instructions Will Be Listed Below (If Applicable). MONITOR YOUR BLOOD PRESSURE DAILY, BRING LOG AND MACHINE TO FOLLOW UP   If you need a refill on your cardiac medications before your next appointment, please call your pharmacy.

## 2023-04-05 ENCOUNTER — Ambulatory Visit: Payer: PPO | Admitting: Occupational Therapy

## 2023-04-05 ENCOUNTER — Encounter: Payer: Self-pay | Admitting: Occupational Therapy

## 2023-04-05 DIAGNOSIS — I890 Lymphedema, not elsewhere classified: Secondary | ICD-10-CM
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
## 2023-04-10 DIAGNOSIS — R809 Proteinuria, unspecified: Secondary | ICD-10-CM | POA: Diagnosis not present

## 2023-04-10 DIAGNOSIS — E1122 Type 2 diabetes mellitus with diabetic chronic kidney disease: Secondary | ICD-10-CM | POA: Diagnosis not present

## 2023-04-10 DIAGNOSIS — I129 Hypertensive chronic kidney disease with stage 1 through stage 4 chronic kidney disease, or unspecified chronic kidney disease: Secondary | ICD-10-CM | POA: Diagnosis not present

## 2023-04-10 DIAGNOSIS — N1832 Chronic kidney disease, stage 3b: Secondary | ICD-10-CM | POA: Diagnosis not present

## 2023-04-10 LAB — LAB REPORT - SCANNED
Creatinine, POC: 62.4 mg/dL
EGFR: 46

## 2023-04-11 ENCOUNTER — Ambulatory Visit: Payer: PPO | Admitting: Occupational Therapy

## 2023-04-11 ENCOUNTER — Telehealth: Payer: Self-pay

## 2023-04-11 DIAGNOSIS — I890 Lymphedema, not elsewhere classified: Secondary | ICD-10-CM
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
## 2023-04-11 NOTE — Telephone Encounter (Signed)
PA request for Dexcom received via fax.  PA not submitted due to FreeStyle CGM being preferred system.  PA form being attached in patients documents for retention.

## 2023-04-12 ENCOUNTER — Ambulatory Visit: Payer: PPO | Admitting: Occupational Therapy

## 2023-04-16 ENCOUNTER — Other Ambulatory Visit (HOSPITAL_BASED_OUTPATIENT_CLINIC_OR_DEPARTMENT_OTHER): Payer: Self-pay | Admitting: Nurse Practitioner

## 2023-04-16 ENCOUNTER — Other Ambulatory Visit: Payer: Self-pay

## 2023-04-16 DIAGNOSIS — E559 Vitamin D deficiency, unspecified: Secondary | ICD-10-CM

## 2023-04-16 MED ORDER — FREESTYLE LIBRE 3 SENSOR MISC: 2 refills | Status: DC

## 2023-04-16 NOTE — Telephone Encounter (Signed)
Refill request last apt 12/07/22. 

## 2023-04-17 ENCOUNTER — Encounter: Payer: Self-pay | Admitting: Nurse Practitioner

## 2023-04-17 ENCOUNTER — Ambulatory Visit (HOSPITAL_BASED_OUTPATIENT_CLINIC_OR_DEPARTMENT_OTHER): Payer: PPO | Admitting: Physical Therapy

## 2023-04-19 ENCOUNTER — Ambulatory Visit: Payer: PPO | Attending: Nurse Practitioner | Admitting: Occupational Therapy

## 2023-04-19 DIAGNOSIS — I890 Lymphedema, not elsewhere classified: Secondary | ICD-10-CM | POA: Diagnosis present
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
## 2023-04-20 ENCOUNTER — Encounter (HOSPITAL_BASED_OUTPATIENT_CLINIC_OR_DEPARTMENT_OTHER): Payer: Self-pay | Admitting: Cardiovascular Disease

## 2023-04-20 ENCOUNTER — Ambulatory Visit (INDEPENDENT_AMBULATORY_CARE_PROVIDER_SITE_OTHER): Payer: PPO

## 2023-04-20 DIAGNOSIS — I5032 Chronic diastolic (congestive) heart failure: Secondary | ICD-10-CM | POA: Diagnosis not present

## 2023-04-20 DIAGNOSIS — R609 Edema, unspecified: Secondary | ICD-10-CM

## 2023-04-20 LAB — ECHOCARDIOGRAM COMPLETE
AR max vel: 2.51 cm2
AV Area VTI: 2.84 cm2
AV Area mean vel: 2.49 cm2
AV Mean grad: 4 mmHg
AV Peak grad: 8.5 mmHg
Ao pk vel: 1.46 m/s
Area-P 1/2: 3.61 cm2
MV M vel: 5.93 m/s
MV Peak grad: 140.7 mmHg
S' Lateral: 3.33 cm

## 2023-04-23 ENCOUNTER — Ambulatory Visit (HOSPITAL_BASED_OUTPATIENT_CLINIC_OR_DEPARTMENT_OTHER): Payer: PPO

## 2023-04-23 NOTE — Telephone Encounter (Signed)
Follow up questions?  

## 2023-04-24 ENCOUNTER — Encounter (HOSPITAL_BASED_OUTPATIENT_CLINIC_OR_DEPARTMENT_OTHER): Payer: Self-pay | Admitting: Physical Therapy

## 2023-04-24 ENCOUNTER — Ambulatory Visit: Payer: PPO | Admitting: Occupational Therapy

## 2023-04-24 ENCOUNTER — Encounter: Payer: Self-pay | Admitting: Occupational Therapy

## 2023-04-24 ENCOUNTER — Ambulatory Visit (HOSPITAL_BASED_OUTPATIENT_CLINIC_OR_DEPARTMENT_OTHER): Payer: PPO | Attending: Orthopaedic Surgery | Admitting: Physical Therapy

## 2023-04-24 DIAGNOSIS — M25511 Pain in right shoulder: Secondary | ICD-10-CM | POA: Diagnosis present

## 2023-04-24 DIAGNOSIS — M25512 Pain in left shoulder: Secondary | ICD-10-CM | POA: Diagnosis present

## 2023-04-24 DIAGNOSIS — G8929 Other chronic pain: Secondary | ICD-10-CM | POA: Insufficient documentation

## 2023-04-24 DIAGNOSIS — I890 Lymphedema, not elsewhere classified: Secondary | ICD-10-CM

## 2023-04-24 DIAGNOSIS — M25611 Stiffness of right shoulder, not elsewhere classified: Secondary | ICD-10-CM | POA: Diagnosis present

## 2023-04-24 DIAGNOSIS — M6281 Muscle weakness (generalized): Secondary | ICD-10-CM | POA: Insufficient documentation

## 2023-04-24 DIAGNOSIS — M25612 Stiffness of left shoulder, not elsewhere classified: Secondary | ICD-10-CM | POA: Insufficient documentation
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
## 2023-04-26 ENCOUNTER — Ambulatory Visit: Payer: PPO | Admitting: Occupational Therapy

## 2023-04-26 DIAGNOSIS — I890 Lymphedema, not elsewhere classified: Secondary | ICD-10-CM
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
## 2023-04-30 ENCOUNTER — Ambulatory Visit: Payer: PPO | Admitting: Occupational Therapy

## 2023-05-01 ENCOUNTER — Encounter (HOSPITAL_BASED_OUTPATIENT_CLINIC_OR_DEPARTMENT_OTHER): Payer: PPO | Admitting: Physical Therapy

## 2023-05-02 ENCOUNTER — Ambulatory Visit (HOSPITAL_BASED_OUTPATIENT_CLINIC_OR_DEPARTMENT_OTHER): Payer: PPO | Admitting: Physical Therapy

## 2023-05-02 ENCOUNTER — Encounter (HOSPITAL_BASED_OUTPATIENT_CLINIC_OR_DEPARTMENT_OTHER): Payer: Self-pay | Admitting: Physical Therapy

## 2023-05-02 DIAGNOSIS — M25611 Stiffness of right shoulder, not elsewhere classified: Secondary | ICD-10-CM | POA: Diagnosis not present

## 2023-05-02 DIAGNOSIS — M25612 Stiffness of left shoulder, not elsewhere classified: Secondary | ICD-10-CM

## 2023-05-02 DIAGNOSIS — M6281 Muscle weakness (generalized): Secondary | ICD-10-CM
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
## 2023-05-03 ENCOUNTER — Ambulatory Visit: Payer: PPO | Admitting: Occupational Therapy

## 2023-05-03 ENCOUNTER — Encounter: Payer: Self-pay | Admitting: Occupational Therapy

## 2023-05-03 DIAGNOSIS — I890 Lymphedema, not elsewhere classified: Secondary | ICD-10-CM
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
## 2023-05-07 ENCOUNTER — Encounter: Payer: Self-pay | Admitting: Occupational Therapy

## 2023-05-07 ENCOUNTER — Ambulatory Visit: Payer: PPO | Admitting: Occupational Therapy

## 2023-05-07 DIAGNOSIS — I890 Lymphedema, not elsewhere classified: Secondary | ICD-10-CM
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
## 2023-05-08 ENCOUNTER — Ambulatory Visit (HOSPITAL_BASED_OUTPATIENT_CLINIC_OR_DEPARTMENT_OTHER): Payer: PPO | Admitting: Physical Therapy

## 2023-05-08 ENCOUNTER — Encounter (HOSPITAL_BASED_OUTPATIENT_CLINIC_OR_DEPARTMENT_OTHER): Payer: Self-pay | Admitting: Cardiovascular Disease

## 2023-05-08 ENCOUNTER — Encounter: Payer: Self-pay | Admitting: Internal Medicine

## 2023-05-09 ENCOUNTER — Encounter: Payer: PPO | Admitting: Occupational Therapy

## 2023-05-09 ENCOUNTER — Encounter: Payer: Self-pay | Admitting: Nurse Practitioner

## 2023-05-10 ENCOUNTER — Encounter (HOSPITAL_BASED_OUTPATIENT_CLINIC_OR_DEPARTMENT_OTHER): Payer: Self-pay | Admitting: Cardiovascular Disease

## 2023-05-10 ENCOUNTER — Ambulatory Visit (INDEPENDENT_AMBULATORY_CARE_PROVIDER_SITE_OTHER): Payer: PPO | Admitting: Family Medicine

## 2023-05-10 ENCOUNTER — Encounter: Payer: Self-pay | Admitting: Family Medicine

## 2023-05-10 VITALS — BP 160/90 | HR 75 | Temp 98.2°F | Resp 16

## 2023-05-10 DIAGNOSIS — I152 Hypertension secondary to endocrine disorders: Secondary | ICD-10-CM

## 2023-05-10 DIAGNOSIS — E1159 Type 2 diabetes mellitus with other circulatory complications: Secondary | ICD-10-CM

## 2023-05-10 DIAGNOSIS — R519 Headache, unspecified: Secondary | ICD-10-CM

## 2023-05-10 MED ORDER — LOSARTAN POTASSIUM-HCTZ 100-12.5 MG PO TABS
1.0000 | ORAL_TABLET | Freq: Every day | ORAL | 1 refills | Status: DC
Start: 2023-05-10 — End: 2024-04-03

## 2023-05-10 MED ORDER — RIZATRIPTAN BENZOATE 5 MG PO TABS
5.0000 mg | ORAL_TABLET | ORAL | 0 refills | Status: DC | PRN
Start: 2023-05-10 — End: 2023-06-07

## 2023-05-10 NOTE — Progress Notes (Signed)
   Subjective:    Patient ID: Cynthia [name], female    DOB: 02/19/1956, 67 y.o.   MRN: [id number]  HPI She is here for evaluation of her blood pressure and headaches.  She is concerned that the headaches are from her blood pressure.  Medical record was reviewed and she states that she was told to take hydralazine 3 times per day however the record indicates she should be taking it twice per day.  She gives a history of the headaches being worse in the last several weeks however there is a previous history of headaches and in fact that she has had that Maxalt that she had leftover from previous experience with headaches tended to help with the present headache.  Presently she is supposed to be on losartan/HCTZ, hydralazine and metoprolol.  She did not bring her medications in with her.   Review of Systems     Objective:    Physical Exam Alert and in no distress otherwise not examined       Assessment & Plan:    Hypertension associated with diabetes (HCC) - Plan: losartan-hydrochlorothiazide (HYZAAR) 100-12.5 MG tablet  Persistent headaches - Plan: rizatriptan (MAXALT) 5 MG tablet After discussion with her and reviewing the record there is a question about the true cause of her headaches as well as control of her blood pressure. Take your blood pressure in the resting for 5 minutes, sitting position with your arm at heart level Take the Maxalt as needed for the headache Take hydralazine twice per day and take the metoprolol where you are right now I think the headache and the blood pressure are separate She will be scheduled to follow-up with Sarabeth concerning the headache and to follow-up on her blood pressure.

## 2023-05-10 NOTE — Patient Instructions (Addendum)
Take your blood pressure in the resting for 5 minutes, sitting position with your arm at heart level Take the Maxalt as needed for the headache Take hydralazine twice per day and take the metoprolol where you are right now I think the headache and the blood pressure are separate

## 2023-05-11 ENCOUNTER — Ambulatory Visit: Payer: PPO | Admitting: Occupational Therapy

## 2023-05-13 ENCOUNTER — Other Ambulatory Visit (HOSPITAL_BASED_OUTPATIENT_CLINIC_OR_DEPARTMENT_OTHER): Payer: Self-pay | Admitting: Nurse Practitioner

## 2023-05-13 DIAGNOSIS — I129 Hypertensive chronic kidney disease with stage 1 through stage 4 chronic kidney disease, or unspecified chronic kidney disease: Secondary | ICD-10-CM

## 2023-05-14 ENCOUNTER — Ambulatory Visit (HOSPITAL_BASED_OUTPATIENT_CLINIC_OR_DEPARTMENT_OTHER): Payer: PPO | Attending: Orthopaedic Surgery | Admitting: Physical Therapy

## 2023-05-14 ENCOUNTER — Encounter (HOSPITAL_BASED_OUTPATIENT_CLINIC_OR_DEPARTMENT_OTHER): Payer: Self-pay | Admitting: Physical Therapy

## 2023-05-14 ENCOUNTER — Other Ambulatory Visit (HOSPITAL_BASED_OUTPATIENT_CLINIC_OR_DEPARTMENT_OTHER): Payer: Self-pay | Admitting: Nurse Practitioner

## 2023-05-14 DIAGNOSIS — G8929 Other chronic pain: Secondary | ICD-10-CM

## 2023-05-14 DIAGNOSIS — M25612 Stiffness of left shoulder, not elsewhere classified: Secondary | ICD-10-CM

## 2023-05-14 DIAGNOSIS — M25511 Pain in right shoulder: Secondary | ICD-10-CM | POA: Diagnosis present

## 2023-05-14 DIAGNOSIS — M6281 Muscle weakness (generalized): Secondary | ICD-10-CM

## 2023-05-14 DIAGNOSIS — M25512 Pain in left shoulder: Secondary | ICD-10-CM | POA: Diagnosis present

## 2023-05-14 DIAGNOSIS — M25611 Stiffness of right shoulder, not elsewhere classified: Secondary | ICD-10-CM | POA: Diagnosis present

## 2023-05-14 DIAGNOSIS — E1149 Type 2 diabetes mellitus with other diabetic neurological complication: Secondary | ICD-10-CM
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
## 2023-05-14 NOTE — Telephone Encounter (Signed)
Is it ok to refill glucose test strips with directions of checking blood sugars 3 times a day? Patient's chart shows that she has a continuous glucose monitor also.

## 2023-05-15 ENCOUNTER — Encounter: Payer: Self-pay | Admitting: Occupational Therapy

## 2023-05-15 ENCOUNTER — Ambulatory Visit: Payer: PPO | Attending: Nurse Practitioner | Admitting: Occupational Therapy

## 2023-05-15 DIAGNOSIS — I890 Lymphedema, not elsewhere classified: Secondary | ICD-10-CM | POA: Diagnosis present
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
## 2023-05-16 ENCOUNTER — Encounter (HOSPITAL_BASED_OUTPATIENT_CLINIC_OR_DEPARTMENT_OTHER): Payer: Self-pay | Admitting: Cardiovascular Disease

## 2023-05-16 ENCOUNTER — Encounter (HOSPITAL_BASED_OUTPATIENT_CLINIC_OR_DEPARTMENT_OTHER): Payer: Self-pay | Admitting: Physical Therapy

## 2023-05-16 ENCOUNTER — Ambulatory Visit (HOSPITAL_BASED_OUTPATIENT_CLINIC_OR_DEPARTMENT_OTHER): Payer: PPO | Admitting: Physical Therapy

## 2023-05-16 ENCOUNTER — Encounter (HOSPITAL_BASED_OUTPATIENT_CLINIC_OR_DEPARTMENT_OTHER): Payer: Self-pay

## 2023-05-16 DIAGNOSIS — M6281 Muscle weakness (generalized): Secondary | ICD-10-CM

## 2023-05-16 DIAGNOSIS — M25611 Stiffness of right shoulder, not elsewhere classified: Secondary | ICD-10-CM

## 2023-05-16 DIAGNOSIS — M25612 Stiffness of left shoulder, not elsewhere classified: Secondary | ICD-10-CM

## 2023-05-16 NOTE — Therapy (Signed)
[facility] [address] [place], [place], [address] Phone: [phone]   Fax:  [phone]  Patient Details  Name: Cynthia [name] MRN: [id number] Date of Birth: 1956-04-01 Referring Provider:  Tollie Eth, NP  Encounter Date: 05/16/2023  Patient arrived and reported she didn't feel well. She stated that she has been having issues with her blood pressure; some medication changes in last 5 days.  Blood pressure checked by supervising PT, Geni Bers: 158/88.  She encourged pt to continue monitoring BP and symptoms, and to relay information back to her primary Dr.   Lisbeth Ply treatment today, will resume at next visit.  No charge.   Mayer Camel, PTA 05/16/23 10:56 AM

## 2023-05-19 ENCOUNTER — Other Ambulatory Visit: Payer: Self-pay | Admitting: Nurse Practitioner

## 2023-05-19 DIAGNOSIS — E1159 Type 2 diabetes mellitus with other circulatory complications: Secondary | ICD-10-CM

## 2023-05-23 ENCOUNTER — Encounter: Payer: Self-pay | Admitting: Nurse Practitioner

## 2023-05-23 ENCOUNTER — Encounter (HOSPITAL_BASED_OUTPATIENT_CLINIC_OR_DEPARTMENT_OTHER): Payer: Self-pay | Admitting: Cardiovascular Disease

## 2023-05-23 ENCOUNTER — Ambulatory Visit (HOSPITAL_BASED_OUTPATIENT_CLINIC_OR_DEPARTMENT_OTHER): Payer: PPO | Admitting: Physical Therapy

## 2023-05-24 ENCOUNTER — Encounter: Payer: Self-pay | Admitting: Nurse Practitioner

## 2023-05-24 ENCOUNTER — Ambulatory Visit (INDEPENDENT_AMBULATORY_CARE_PROVIDER_SITE_OTHER): Payer: PPO | Admitting: Nurse Practitioner

## 2023-05-24 VITALS — BP 134/86 | HR 56 | Wt 236.8 lb

## 2023-05-24 DIAGNOSIS — E1159 Type 2 diabetes mellitus with other circulatory complications: Secondary | ICD-10-CM

## 2023-05-24 DIAGNOSIS — I152 Hypertension secondary to endocrine disorders: Secondary | ICD-10-CM | POA: Diagnosis not present

## 2023-05-24 MED ORDER — AMLODIPINE BESYLATE 5 MG PO TABS: 5.0000 mg | ORAL_TABLET | Freq: Every day | ORAL | 3 refills | Status: DC

## 2023-05-24 NOTE — Telephone Encounter (Signed)
See 7/10 response from Dr Duke Salvia in 7/3 mychart message

## 2023-05-24 NOTE — Telephone Encounter (Signed)
Here is the one you told me to send you

## 2023-05-24 NOTE — Progress Notes (Signed)
[name], DNP, AGNP-c [facility]  [address] [place], [place] [address] [phone]  ESTABLISHED PATIENT- Chronic Health and/or Follow-Up Visit  Blood pressure 134/86, pulse (!) 56, weight 236 lb 12.8 oz (107.4 kg).    Cynthia [name] is a 67 y.o. year old female presenting today for evaluation and management of chronic conditions.   HTN She tells me she was seen by cardiology and her BP was 130/80's. Dr. Duke Salvia changed her medication to hydralazine three times a day and increased this to 50mg . She was seen by Dr. Susann Givens and he increased her losartan to 100mg  and stopped the hydralazine. She contacted cardiology and they told her to keep the losartan dose and restart the hydralazine at 100mg  three times a day.    Cynthia [name] has made efforts to control her blood pressure, such as reducing salt intake, but has experienced fluctuating blood pressure readings. She expresses concern that her body may not be responding well to hydralazine. She recently had an echocardiogram, which revealed a stiff heart, and she was advised to keep her blood pressure under control. She is requesting a referral to another cardiologist.  Cynthia [name] has a history of trying various blood pressure medications, including clonidine, verapamil, and amlodipine, but has experienced side effects or discontinued them for various reasons. She reports a history of high blood pressure since her 30s, with no known family history of the condition.  She also mentions using a Libre sensor for monitoring her blood sugar levels, which has been giving her critically low readings, causing her to wake up frequently at night. She has been attending physical therapy for her shoulders and is scheduled to begin aquatic therapy soon. Cynthia [name] is also dealing with issues related to a compression machine recommended by her physical therapist, who has concerns about the company providing the device  Yesterday 172/91- after  medication 175/91 2 hours later in the afternoon 173/96 At 4pm it was 158/90. This morning 157/86 after 100mg  hydralazine.   She recently had a echo and she was told her heart was stiff.     All ROS negative with exception of what is listed above.   PHYSICAL EXAM Physical Exam Vitals and nursing note reviewed.  Constitutional:      Appearance: Normal appearance.  HENT:     Head: Normocephalic.  Eyes:     Pupils: Pupils are equal, round, and reactive to light.  Neck:     Vascular: No carotid bruit.  Cardiovascular:     Rate and Rhythm: Normal rate and regular rhythm.     Pulses: Normal pulses.     Heart sounds: Normal heart sounds.  Pulmonary:     Effort: Pulmonary effort is normal.     Breath sounds: Normal breath sounds.  Musculoskeletal:        General: Normal range of motion.     Cervical back: Normal range of motion.     Right lower leg: No edema.     Left lower leg: No edema.  Skin:    General: Skin is warm.  Neurological:     General: No focal deficit present.     Mental Status: She is alert and oriented to person, place, and time.  Psychiatric:        Mood and Affect: Mood normal.     PLAN Problem List Items Addressed This Visit     Hypertension associated with diabetes (HCC) - Primary    Cynthia [name]'s blood pressure has been poorly controlled despite recent medication adjustments,  including increasing hydralazine and changing to Losartan 100mg . She has tried various medications in the past like verapamil and amlodipine with some success. Plan:   - Discontinue hydralazine for now - Restart amlodipine 5mg  daily  - If her blood pressure remains above 130/80 for 2-3 days, increase amlodipine to 10mg  daily - Continue Losartan 100mg  and metoprolol 200mg  as prescribed - Monitor her blood pressure regularly and report any significant changes      Relevant Medications   amLODipine (NORVASC) 5 MG tablet   Other Relevant Orders   CBC with Differential/Platelet  (Completed)   Comprehensive metabolic panel (Completed)   TSH (Completed)   Lipid Panel (Completed)    Return in about 4 weeks (around 06/21/2023) for Med Management 45.  Time: 40 minutes, >50% spent counseling, care coordination, chart review, and documentation.   [name], DNP, AGNP-c

## 2023-05-24 NOTE — Patient Instructions (Addendum)
Calcium Channel Blockers are the first line recommendation for African Americans  Amlodipine 5mg - start today.  If your blood pressure is staying higher than 130/80 for 2-3 days then I want you to increase the Amlodipine to 10mg  a day.  I want you to keep taking: Metoprolol 200mg  daily Losartan - hydrochlorothiazide 100mg /12.5mg    If your blood pressures jump up you can take hydralazine 50mg  up to twice a day to help bring it down.

## 2023-05-25 LAB — CBC WITH DIFFERENTIAL/PLATELET
Basophils Absolute: 0.1 10*3/uL (ref 0.0–0.2)
Basos: 1 %
EOS (ABSOLUTE): 0.2 10*3/uL (ref 0.0–0.4)
Eos: 4 %
Hematocrit: 37.7 % (ref 34.0–46.6)
Hemoglobin: 12.4 g/dL (ref 11.1–15.9)
Immature Grans (Abs): 0 10*3/uL (ref 0.0–0.1)
Immature Granulocytes: 0 %
Lymphocytes Absolute: 1.5 10*3/uL (ref 0.7–3.1)
Lymphs: 30 %
MCH: 27.7 pg (ref 26.6–33.0)
MCHC: 32.9 g/dL (ref 31.5–35.7)
MCV: 84 fL (ref 79–97)
Monocytes Absolute: 0.3 10*3/uL (ref 0.1–0.9)
Monocytes: 7 %
Neutrophils Absolute: 3 10*3/uL (ref 1.4–7.0)
Neutrophils: 58 %
Platelets: 298 10*3/uL (ref 150–450)
RBC: 4.48 x10E6/uL (ref 3.77–5.28)
RDW: 13.3 % (ref 11.7–15.4)
WBC: 5.1 10*3/uL (ref 3.4–10.8)

## 2023-05-25 LAB — COMPREHENSIVE METABOLIC PANEL
ALT: 12 IU/L (ref 0–32)
AST: 15 IU/L (ref 0–40)
Albumin: 4.2 g/dL (ref 3.9–4.9)
Alkaline Phosphatase: 74 IU/L (ref 44–121)
BUN/Creatinine Ratio: 17 (ref 12–28)
BUN: 24 mg/dL (ref 8–27)
Bilirubin Total: 0.4 mg/dL (ref 0.0–1.2)
CO2: 24 mmol/L (ref 20–29)
Calcium: 9.7 mg/dL (ref 8.7–10.3)
Chloride: 98 mmol/L (ref 96–106)
Creatinine, Ser: 1.41 mg/dL — ABNORMAL HIGH (ref 0.57–1.00)
Globulin, Total: 2.8 g/dL (ref 1.5–4.5)
Glucose: 127 mg/dL — ABNORMAL HIGH (ref 70–99)
Potassium: 4.1 mmol/L (ref 3.5–5.2)
Sodium: 137 mmol/L (ref 134–144)
Total Protein: 7 g/dL (ref 6.0–8.5)
eGFR: 41 mL/min/{1.73_m2} — ABNORMAL LOW (ref >59)

## 2023-05-25 LAB — LIPID PANEL
Chol/HDL Ratio: 3.4 ratio (ref 0.0–4.4)
Cholesterol, Total: 188 mg/dL (ref 100–199)
HDL: 56 mg/dL (ref >39)
LDL Chol Calc (NIH): 111 mg/dL — ABNORMAL HIGH (ref 0–99)
Triglycerides: 117 mg/dL (ref 0–149)
VLDL Cholesterol Cal: 21 mg/dL (ref 5–40)

## 2023-05-25 LAB — TSH: TSH: 2.67 u[IU]/mL (ref 0.450–4.500)

## 2023-05-28 DIAGNOSIS — I890 Lymphedema, not elsewhere classified: Secondary | ICD-10-CM | POA: Diagnosis not present

## 2023-05-30 ENCOUNTER — Encounter (HOSPITAL_BASED_OUTPATIENT_CLINIC_OR_DEPARTMENT_OTHER): Payer: Self-pay | Admitting: Physical Therapy

## 2023-05-30 ENCOUNTER — Ambulatory Visit (HOSPITAL_BASED_OUTPATIENT_CLINIC_OR_DEPARTMENT_OTHER): Payer: PPO | Admitting: Physical Therapy

## 2023-05-30 DIAGNOSIS — M25611 Stiffness of right shoulder, not elsewhere classified: Secondary | ICD-10-CM | POA: Diagnosis not present

## 2023-05-30 DIAGNOSIS — M25612 Stiffness of left shoulder, not elsewhere classified: Secondary | ICD-10-CM

## 2023-05-30 DIAGNOSIS — M6281 Muscle weakness (generalized): Secondary | ICD-10-CM
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
## 2023-05-31 ENCOUNTER — Telehealth: Payer: Self-pay | Admitting: Nurse Practitioner

## 2023-05-31 ENCOUNTER — Ambulatory Visit: Payer: PPO | Admitting: Occupational Therapy

## 2023-05-31 ENCOUNTER — Encounter: Payer: Self-pay | Admitting: Nurse Practitioner

## 2023-05-31 NOTE — Telephone Encounter (Signed)
Pt called about her My Chart message she sent earlier and I advised her that is was forwarded to [facility] but she is out of office until 7/24 and I advised that I can forward to another provider. She did not want to have it sent to another provider, states she went to another provider and was not happy with them

## 2023-06-04 DIAGNOSIS — I10 Essential (primary) hypertension: Secondary | ICD-10-CM | POA: Diagnosis not present

## 2023-06-05 ENCOUNTER — Other Ambulatory Visit: Payer: Self-pay

## 2023-06-05 ENCOUNTER — Encounter (HOSPITAL_BASED_OUTPATIENT_CLINIC_OR_DEPARTMENT_OTHER): Payer: Self-pay | Admitting: Emergency Medicine

## 2023-06-05 ENCOUNTER — Emergency Department (HOSPITAL_BASED_OUTPATIENT_CLINIC_OR_DEPARTMENT_OTHER)
Admission: EM | Admit: 2023-06-05 | Discharge: 2023-06-05 | Disposition: A | Discharge status: Home / Self Care | Payer: PPO | Attending: Emergency Medicine | Admitting: Emergency Medicine

## 2023-06-05 ENCOUNTER — Emergency Department (HOSPITAL_BASED_OUTPATIENT_CLINIC_OR_DEPARTMENT_OTHER): Payer: PPO | Admitting: Radiology

## 2023-06-05 DIAGNOSIS — I700 Atherosclerosis of aorta: Secondary | ICD-10-CM | POA: Diagnosis not present

## 2023-06-05 DIAGNOSIS — R0789 Other chest pain: Secondary | ICD-10-CM | POA: Diagnosis not present

## 2023-06-05 DIAGNOSIS — R079 Chest pain, unspecified: Secondary | ICD-10-CM | POA: Diagnosis present

## 2023-06-05 DIAGNOSIS — J9811 Atelectasis: Secondary | ICD-10-CM | POA: Diagnosis not present

## 2023-06-05 DIAGNOSIS — I10 Essential (primary) hypertension: Secondary | ICD-10-CM | POA: Diagnosis not present

## 2023-06-05 DIAGNOSIS — Z79899 Other long term (current) drug therapy: Secondary | ICD-10-CM | POA: Diagnosis not present

## 2023-06-05 DIAGNOSIS — R0602 Shortness of breath: Secondary | ICD-10-CM | POA: Diagnosis not present

## 2023-06-05 DIAGNOSIS — I771 Stricture of artery: Secondary | ICD-10-CM | POA: Diagnosis not present

## 2023-06-05 LAB — CBC
HCT: 38.3 % (ref 36.0–46.0)
Hemoglobin: 12.2 g/dL (ref 12.0–15.0)
MCH: 27.4 pg (ref 26.0–34.0)
MCHC: 31.9 g/dL (ref 30.0–36.0)
MCV: 86.1 fL (ref 80.0–100.0)
Platelets: 300 10*3/uL (ref 150–400)
RBC: 4.45 MIL/uL (ref 3.87–5.11)
RDW: 14 % (ref 11.5–15.5)
WBC: 5.4 10*3/uL (ref 4.0–10.5)
nRBC: 0 % (ref 0.0–0.2)

## 2023-06-05 LAB — TROPONIN I (HIGH SENSITIVITY)
Troponin I (High Sensitivity): 6 ng/L (ref <18)
Troponin I (High Sensitivity): 6 ng/L (ref <18)

## 2023-06-05 LAB — D-DIMER, QUANTITATIVE: D-Dimer, Quant: 0.52 ug/mL-FEU — ABNORMAL HIGH (ref 0.00–0.50)

## 2023-06-05 LAB — BASIC METABOLIC PANEL
Anion gap: 8 (ref 5–15)
BUN: 32 mg/dL — ABNORMAL HIGH (ref 8–23)
CO2: 29 mmol/L (ref 22–32)
Calcium: 9.8 mg/dL (ref 8.9–10.3)
Chloride: 100 mmol/L (ref 98–111)
Creatinine, Ser: 1.32 mg/dL — ABNORMAL HIGH (ref 0.44–1.00)
GFR, Estimated: 44 mL/min — ABNORMAL LOW (ref >60)
Glucose, Bld: 146 mg/dL — ABNORMAL HIGH (ref 70–99)
Potassium: 4.2 mmol/L (ref 3.5–5.1)
Sodium: 137 mmol/L (ref 135–145)

## 2023-06-05 NOTE — ED Provider Notes (Signed)
[place] EMERGENCY DEPARTMENT AT [facility] Provider Note   CSN: [id number] Arrival date & time: 06/05/23  1720     History  Chief Complaint  Patient presents with   Chest Pain    Cynthia [name] is a 67 y.o. female, history of hypertension, who presents to the ED secondary to sudden chest pain that began around 3 PM today, the last about 4 minutes.  She states that it came on suddenly, and felt tight in her chest, and was associated with shortness of breath.  She felt a little weak so she sat down.  Denies any current chest pain, no nausea, vomiting, diaphoresis with the episode.  Does state that she has been under more stress lately.  History of DVT, a few years ago after hip surgery.  Has never had any episodes like this before.     Home Medications Prior to Admission medications   Medication Sig Start Date End Date Taking? Authorizing Provider  AMBULATORY NON FORMULARY MEDICATION Product manager as covered by insurance for ICD-10: J30.89 11/02/21   Early, Sung Amabile, NP  amitriptyline (ELAVIL) 50 MG tablet TAKE 1 TABLET BY MOUTH AT BEDTIME AS NEEDED FOR SLEEP. Patient not taking: Reported on 05/10/2023 01/31/23   Early, Sung Amabile, NP  amLODipine (NORVASC) 5 MG tablet Take 1 tablet (5 mg total) by mouth daily. 05/24/23   Tollie Eth, NP  atorvastatin (LIPITOR) 20 MG tablet TAKE 1 TABLET BY MOUTH EVERYDAY AT BEDTIME 12/07/22   Early, Sung Amabile, NP  bisacodyl (DULCOLAX) 5 MG EC tablet Take 5 mg by mouth daily as needed for moderate constipation.    [provider]  blood glucose meter kit and supplies KIT Meter, test strips, lancets, and alcohol swabs. Use for testing blood sugar every morning before meals and up to 3 additional times per day. Brand based on patient and insurance coverage. 100 strips with 99 refills, 100 lancets with 99 refills, 200 alcohol swabs with 99 refills. Dx: E11.65 05/23/22   Tollie Eth, NP  Continuous Glucose Sensor (DEXCOM G7 SENSOR) MISC 1 Device  by Does not apply route as directed. Change every 10 days Patient not taking: Reported on 05/24/2023 03/27/23   Shamleffer, Konrad Dolores, MD  Continuous Glucose Sensor (FREESTYLE LIBRE 3 SENSOR) MISC Change sensors every 14 days. 04/16/23   Shamleffer, Konrad Dolores, MD  dapagliflozin propanediol (FARXIGA) 10 MG TABS tablet Take 1 tablet (10 mg total) by mouth daily before breakfast. 03/27/23   Shamleffer, Konrad Dolores, MD  diclofenac Sodium (VOLTAREN) 1 % GEL Apply 4 g topically 4 (four) times daily. Patient not taking: Reported on 05/10/2023 10/27/22   Huel Cote, MD  fluticasone [facility]) 50 MCG/ACT nasal spray Place 2 sprays into both nostrils daily. Patient not taking: Reported on 05/24/2023 06/07/22   de [place], Buren Kos, MD  glucose blood [facility] VERIO) test strip USE AS INSTRUCTED 3 TIMES A DAY 05/15/23   Early, Sung Amabile, NP  hydrALAZINE (APRESOLINE) 50 MG tablet Take 1 tablet (50 mg total) by mouth 2 (two) times daily at 8 am and 10 pm. Patient taking differently: Take 50 mg by mouth 2 (two) times daily at 8 am and 10 pm. Taking 25mg  three times a day 04/04/23 07/03/23  Chilton Si, MD  hydrOXYzine (ATARAX) 25 MG tablet Take 25 mg by mouth 2 (two) times daily.    [provider]  insulin glargine (LANTUS SOLOSTAR) 100 UNIT/ML Solostar Pen Inject 26 Units into the skin at bedtime. 03/27/23  Shamleffer, Konrad Dolores, MD  Insulin Pen Needle 32G X 4 MM MISC 1 Device by Does not apply route in the morning, at noon, in the evening, and at bedtime. 03/27/23   Shamleffer, Konrad Dolores, MD  losartan-hydrochlorothiazide (HYZAAR) 100-12.5 MG tablet Take 1 tablet by mouth daily. 05/10/23   Ronnald Nian, MD  metoprolol (TOPROL-XL) 200 MG 24 hr tablet TAKE 1 TABLET BY MOUTH EVERY DAY 04/16/23   Early, Sung Amabile, NP  Microlet Lancets MISC Use for testing blood sugar every morning before meals and up to 3 additional times per day. For Microlet lancet device.  Dx: E11.65 05/23/22   Tollie Eth, NP  NOVOLOG FLEXPEN 100 UNIT/ML FlexPen Max daily 30 units 03/27/23   Shamleffer, Konrad Dolores, MD  rizatriptan (MAXALT) 5 MG tablet Take 1 tablet (5 mg total) by mouth as needed for migraine. May repeat in 2 hours if needed 05/10/23   Ronnald Nian, MD  tiZANidine (ZANAFLEX) 4 MG capsule TAKE 1 CAPSULE BY MOUTH 3 TIMES DAILY AS NEEDED FOR MUSCLE SPASMS. 12/07/22   Tollie Eth, NP  Vitamin D, Ergocalciferol, (DRISDOL) 1.25 MG (50000 UNIT) CAPS capsule TAKE 1 CAPSULE (50,000 UNITS TOTAL) BY MOUTH EVERY 7 (SEVEN) DAYS 04/16/23   Early, Sung Amabile, NP      Allergies    Lisinopril, Other, Ambien [zolpidem tartrate], Clonidine derivatives, Cymbalta [duloxetine hcl], Lyrica [pregabalin], Percocet [oxycodone-acetaminophen], and Prednisone    Review of Systems   Review of Systems  Respiratory:  Positive for shortness of breath.   Cardiovascular:  Positive for chest pain.  Gastrointestinal:  Negative for abdominal pain.    Physical Exam Updated Vital Signs BP (!) 143/76   Pulse 69   Temp 97.7 F (36.5 C) (Oral)   Resp 16   Ht 5\' 6"  (1.676 m)   Wt 108 kg   SpO2 92%   BMI 38.43 kg/m  Physical Exam Vitals and nursing note reviewed.  Constitutional:      General: She is not in acute distress.    Appearance: She is well-developed.  HENT:     Head: Normocephalic and atraumatic.  Eyes:     Conjunctiva/sclera: Conjunctivae normal.  Cardiovascular:     Rate and Rhythm: Normal rate and regular rhythm.     Heart sounds: No murmur heard. Pulmonary:     Effort: Pulmonary effort is normal. No respiratory distress.     Breath sounds: Normal breath sounds.  Abdominal:     Palpations: Abdomen is soft.     Tenderness: There is no abdominal tenderness.  Musculoskeletal:        General: No swelling.     Cervical back: Neck supple.  Skin:    General: Skin is warm and dry.     Capillary Refill: Capillary refill takes less than 2 seconds.  Neurological:     Mental Status: She is alert.   Psychiatric:        Mood and Affect: Mood normal.     ED Results / Procedures / Treatments   Labs (all labs ordered are listed, but only abnormal results are displayed) Labs Reviewed  BASIC METABOLIC PANEL - Abnormal; Notable for the following components:      Result Value   Glucose, Bld 146 (*)    BUN 32 (*)    Creatinine, Ser 1.32 (*)    GFR, Estimated 44 (*)    All other components within normal limits  D-DIMER, QUANTITATIVE - Abnormal; Notable for the following components:  D-Dimer, Quant 0.52 (*)    All other components within normal limits  CBC  TROPONIN I (HIGH SENSITIVITY)  TROPONIN I (HIGH SENSITIVITY)    EKG None  Radiology DG Chest 2 View  Result Date: 06/05/2023 CLINICAL DATA:  Chest pain EXAM: CHEST - 2 VIEW COMPARISON:  X-ray 05/03/2021 FINDINGS: There is some linear opacity lung bases likely scar or atelectasis. No consolidation, pneumothorax or effusion. No edema. Normal cardiopericardial silhouette. Calcified and tortuous aorta. Degenerative changes seen along the spine. IMPRESSION: Underinflation.  Basilar atelectasis. Electronically Signed   By: Karen Kays M.D.   On: 06/05/2023 18:22    Procedures Procedures    Medications Ordered in ED Medications - No data to display  ED Course/ Medical Decision Making/ A&P             HEART Score: 4                Medical Decision Making Patient is a 67 year old female, here for chest pain, and shortness of breath that occurred at the around 3 PM, and lasted for 3 to 4 minutes.  She is not in any pain right now.  We will obtain a chest x-ray, troponins, and D-dimer given the shortness of breath, chest pain, and over 50+.  She is PERC positive.  Amount and/or Complexity of Data Reviewed Labs: ordered.    Details: Troponin x 2 within normal limits, D-dimer age stratified within normal limits Radiology: ordered.    Details: Chest x-ray clear ECG/medicine tests: ordered. Decision-making details documented in  ED Course.    Details: NSR Discussion of management or test interpretation with external provider(s): Discussed with patient, she has a heart score of 4, troponins are within normal limits, D-dimer is within normal or less likely given age adjustment.  I believe that she may have suffered from a panic attack however I encouraged her to follow-up with her primary care doctor.  She is not in any pain, as of now, we will discharge her, and have her closely follow-up with her primary care doctor.  We discussed stress management and panic attack presentation.   Final Clinical Impression(s) / ED Diagnoses Final diagnoses:  Chest pain, unspecified type    Rx / DC Orders ED Discharge Orders     None         ,  Elbert Ewings, PA 06/05/23 2205    Edwin Dada P, DO 06/15/23 1339

## 2023-06-05 NOTE — ED Triage Notes (Signed)
Patient c/o chest tightness that started an hour ago.

## 2023-06-05 NOTE — Discharge Instructions (Addendum)
Please follow-up with your primary care doctor, return if your symptoms worsen.  I believe that you may have experienced a panic attack, please try to reduce your stress, and I recommend talking to a therapist, or taking time for yourself.

## 2023-06-06 ENCOUNTER — Ambulatory Visit (HOSPITAL_BASED_OUTPATIENT_CLINIC_OR_DEPARTMENT_OTHER): Payer: PPO
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
## 2023-06-07 ENCOUNTER — Other Ambulatory Visit: Payer: Self-pay | Admitting: Family Medicine

## 2023-06-07 ENCOUNTER — Encounter: Payer: Self-pay | Admitting: Nurse Practitioner

## 2023-06-07 ENCOUNTER — Ambulatory Visit: Payer: HMO | Admitting: Nurse Practitioner

## 2023-06-07 DIAGNOSIS — R519 Headache, unspecified: Secondary | ICD-10-CM

## 2023-06-07 DIAGNOSIS — I152 Hypertension secondary to endocrine disorders: Secondary | ICD-10-CM

## 2023-06-07 MED ORDER — RIZATRIPTAN BENZOATE 5 MG PO TABS
5.0000 mg | ORAL_TABLET | ORAL | 0 refills | Status: AC | PRN
Start: 2023-06-07 — End: ?

## 2023-06-07 NOTE — Telephone Encounter (Signed)
Called pt reached voice mail- lmtrc Question missed appt What meds are needing refilled? Headache meds were just refilled by Dr. Susann Givens Pt has 2 upcoming appt 8/27 [facility] 9/25 [facility]

## 2023-06-08 ENCOUNTER — Ambulatory Visit: Payer: PPO | Admitting: Occupational Therapy

## 2023-06-08 MED ORDER — CLONIDINE 0.1 MG/24HR TD PTWK
0.1000 mg | MEDICATED_PATCH | TRANSDERMAL | 12 refills | Status: DC
Start: 2023-06-08 — End: 2023-09-21

## 2023-06-08 MED ORDER — AMLODIPINE BESYLATE 2.5 MG PO TABS: 2.5000 mg | ORAL_TABLET | Freq: Every day | ORAL | 3 refills | Status: DC

## 2023-06-11 ENCOUNTER — Telehealth: Payer: Self-pay

## 2023-06-11 NOTE — Telephone Encounter (Signed)
Transition Care Management Unsuccessful Follow-up Telephone Call  Date of discharge and from where:  [facility] 7/23  Attempts:  1st Attempt  Reason for unsuccessful TCM follow-up call:  No answer/busy   Lenard Forth [facility] Guide, [facility] [phone] [address] [place], [place], [place] [address] Phone: [phone] Email: [email or web address]

## 2023-06-12 ENCOUNTER — Telehealth: Payer: Self-pay

## 2023-06-12 NOTE — Telephone Encounter (Signed)
Transition Care Management Unsuccessful Follow-up Telephone Call  Date of discharge and from where:  [facility] 7/23  Attempts:  2nd Attempt  Reason for unsuccessful TCM follow-up call:  No answer/busy   Lenard Forth [facility] Guide, [facility] [phone] [address] [place], [place], [place] [address] Phone: [phone] Email: [email or web address]

## 2023-06-16 ENCOUNTER — Other Ambulatory Visit: Payer: Self-pay | Admitting: Nurse Practitioner

## 2023-06-16 DIAGNOSIS — M797 Fibromyalgia: Secondary | ICD-10-CM

## 2023-06-18 NOTE — Telephone Encounter (Signed)
Refill request last apt 05/24/23.

## 2023-06-20 ENCOUNTER — Encounter: Payer: Self-pay | Admitting: Nurse Practitioner

## 2023-06-20 ENCOUNTER — Other Ambulatory Visit (HOSPITAL_BASED_OUTPATIENT_CLINIC_OR_DEPARTMENT_OTHER): Payer: Self-pay

## 2023-06-20 ENCOUNTER — Ambulatory Visit (INDEPENDENT_AMBULATORY_CARE_PROVIDER_SITE_OTHER): Payer: PPO | Admitting: Orthopaedic Surgery

## 2023-06-20 DIAGNOSIS — M7582 Other shoulder lesions, left shoulder: Secondary | ICD-10-CM | POA: Diagnosis not present

## 2023-06-20 DIAGNOSIS — M7581 Other shoulder lesions, right shoulder: Secondary | ICD-10-CM

## 2023-06-20 MED ORDER — LORAZEPAM 1 MG PO TABS
1.0000 mg | ORAL_TABLET | Freq: Three times a day (TID) | ORAL | 0 refills | Status: DC
  Filled 2023-06-20: qty 2, 1d supply, fill #0
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
## 2023-06-21 ENCOUNTER — Ambulatory Visit (HOSPITAL_BASED_OUTPATIENT_CLINIC_OR_DEPARTMENT_OTHER): Payer: Self-pay | Admitting: Physical Therapy

## 2023-06-22 ENCOUNTER — Encounter: Payer: Self-pay | Admitting: Occupational Therapy

## 2023-06-22 ENCOUNTER — Ambulatory Visit: Payer: PPO | Attending: Nurse Practitioner | Admitting: Occupational Therapy

## 2023-06-22 DIAGNOSIS — I890 Lymphedema, not elsewhere classified: Secondary | ICD-10-CM | POA: Insufficient documentation
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
## 2023-06-26 ENCOUNTER — Telehealth: Payer: Self-pay | Admitting: Nurse Practitioner

## 2023-06-26 ENCOUNTER — Ambulatory Visit: Payer: PPO | Admitting: Nurse Practitioner

## 2023-06-26 ENCOUNTER — Encounter: Payer: Self-pay | Admitting: Nurse Practitioner

## 2023-06-26 VITALS — BP 138/84 | HR 67 | Wt 243.4 lb

## 2023-06-26 DIAGNOSIS — I129 Hypertensive chronic kidney disease with stage 1 through stage 4 chronic kidney disease, or unspecified chronic kidney disease: Secondary | ICD-10-CM | POA: Diagnosis not present

## 2023-06-26 DIAGNOSIS — E785 Hyperlipidemia, unspecified: Secondary | ICD-10-CM

## 2023-06-26 DIAGNOSIS — Z794 Long term (current) use of insulin: Secondary | ICD-10-CM

## 2023-06-26 DIAGNOSIS — E1122 Type 2 diabetes mellitus with diabetic chronic kidney disease: Secondary | ICD-10-CM

## 2023-06-26 DIAGNOSIS — E1159 Type 2 diabetes mellitus with other circulatory complications: Secondary | ICD-10-CM | POA: Diagnosis not present

## 2023-06-26 DIAGNOSIS — L659 Nonscarring hair loss, unspecified: Secondary | ICD-10-CM | POA: Diagnosis not present

## 2023-06-26 DIAGNOSIS — N1832 Chronic kidney disease, stage 3b: Secondary | ICD-10-CM | POA: Diagnosis not present

## 2023-06-26 DIAGNOSIS — N183 Chronic kidney disease, stage 3 unspecified: Secondary | ICD-10-CM

## 2023-06-26 DIAGNOSIS — I152 Hypertension secondary to endocrine disorders: Secondary | ICD-10-CM | POA: Diagnosis not present

## 2023-06-26 DIAGNOSIS — I5032 Chronic diastolic (congestive) heart failure: Secondary | ICD-10-CM | POA: Diagnosis not present

## 2023-06-26 LAB — HEMOGLOBIN A1C
Est. average glucose Bld gHb Est-mCnc: 171 mg/dL
Hgb A1c MFr Bld: 7.6 % — ABNORMAL HIGH (ref 4.8–5.6)

## 2023-06-26 LAB — VITAMIN B12

## 2023-06-26 LAB — COMPREHENSIVE METABOLIC PANEL
ALT: 13 IU/L (ref 0–32)
AST: 14 IU/L (ref 0–40)
Albumin: 3.9 g/dL (ref 3.9–4.9)
Alkaline Phosphatase: 80 IU/L (ref 44–121)
BUN/Creatinine Ratio: 14 (ref 12–28)
BUN: 21 mg/dL (ref 8–27)
Bilirubin Total: 0.2 mg/dL (ref 0.0–1.2)
CO2: 26 mmol/L (ref 20–29)
Calcium: 9.6 mg/dL (ref 8.7–10.3)
Chloride: 99 mmol/L (ref 96–106)
Creatinine, Ser: 1.53 mg/dL — ABNORMAL HIGH (ref 0.57–1.00)
Globulin, Total: 2.7 g/dL (ref 1.5–4.5)
Glucose: 100 mg/dL — ABNORMAL HIGH (ref 70–99)
Potassium: 3.9 mmol/L (ref 3.5–5.2)
Sodium: 139 mmol/L (ref 134–144)
Total Protein: 6.6 g/dL (ref 6.0–8.5)
eGFR: 37 mL/min/{1.73_m2} — ABNORMAL LOW (ref >59)

## 2023-06-26 LAB — TSH

## 2023-06-26 LAB — CBC WITH DIFFERENTIAL/PLATELET
Basophils Absolute: 0 10*3/uL (ref 0.0–0.2)
Basos: 1 %
EOS (ABSOLUTE): 0.2 10*3/uL (ref 0.0–0.4)
Eos: 4 %
Hematocrit: 36.8 % (ref 34.0–46.6)
Hemoglobin: 11.6 g/dL (ref 11.1–15.9)
Immature Grans (Abs): 0 10*3/uL (ref 0.0–0.1)
Immature Granulocytes: 0 %
Lymphocytes Absolute: 2.1 10*3/uL (ref 0.7–3.1)
Lymphs: 41 %
MCH: 26.9 pg (ref 26.6–33.0)
MCHC: 31.5 g/dL (ref 31.5–35.7)
MCV: 85 fL (ref 79–97)
Monocytes Absolute: 0.4 10*3/uL (ref 0.1–0.9)
Monocytes: 9 %
Neutrophils Absolute: 2.3 10*3/uL (ref 1.4–7.0)
Neutrophils: 45 %
Platelets: 312 10*3/uL (ref 150–450)
RBC: 4.31 x10E6/uL (ref 3.77–5.28)
RDW: 13.6 % (ref 11.7–15.4)
WBC: 5.1 10*3/uL (ref 3.4–10.8)

## 2023-06-26 LAB — IRON,TIBC AND FERRITIN PANEL

## 2023-06-26 LAB — VITAMIN D 25 HYDROXY (VIT D DEFICIENCY, FRACTURES)

## 2023-06-26 NOTE — Assessment & Plan Note (Signed)
Cynthia [name]'s blood pressure has been poorly controlled despite recent medication adjustments, including increasing hydralazine and changing to Losartan 100mg . She has tried various medications in the past like verapamil and amlodipine with some success. Plan:   - Discontinue hydralazine for now - Restart amlodipine 5mg  daily  - If her blood pressure remains above 130/80 for 2-3 days, increase amlodipine to 10mg  daily - Continue Losartan 100mg  and metoprolol 200mg  as prescribed - Monitor her blood pressure regularly and report any significant changes

## 2023-06-26 NOTE — Progress Notes (Unsigned)
  [name], DNP, AGNP-c [facility]  [address] [place], [place] [address] [phone]  ESTABLISHED PATIENT- Chronic Health and/or Follow-Up Visit  Blood pressure 138/84, pulse 67, weight 243 lb 6.4 oz (110.4 kg).    Cynthia [name] is a 68 y.o. year old female presenting today for evaluation and management of her blood pressure.   Tejocote Root for weight loss  Paisley tells me   All ROS negative with exception of what is listed above.   PHYSICAL EXAM Physical Exam  PLAN Problem List Items Addressed This Visit   None   No follow-ups on file.  Time: *** minutes, >50% spent counseling, care coordination, chart review, and documentation.   [name], DNP, AGNP-c

## 2023-06-26 NOTE — Patient Instructions (Addendum)
I want you to start using the protein drinks when you are not eating a meal.   Keep taking the amlodipine 2.5mg  at bedtime.   If your blood sugar shows that your sugar drops low then always check with a finger stick to make sure it is right.   I do not recommend the Tejecote  WEIGHT LOSS PLANNING and DIABETES  For best management of weight, it is vital to balance intake versus output. This means the number of calories burned per day must be less than the calories you take in with food and drink.   I recommend trying to follow a diet with the following: Calories: 1200-1500 calories per day Carbohydrates: 150-180 grams of carbohydrates per day  Why: Gives your body enough "quick fuel" for cells to maintain normal function without sending them into starvation mode.  Protein: At least 90 grams of protein per day- 30 grams with each meal Why: Protein takes longer and uses more energy than carbohydrates to break down for fuel. The carbohydrates in your meals serves as quick energy sources and proteins help use some of that extra quick energy to break down to produce long term energy. This helps you not feel hungry as quickly and protein breakdown burns calories.  Water: Drink AT LEAST 64 ounces of water per day  Why: Water is essential to healthy metabolism. Water helps to fill the stomach and keep you fuller longer. Water is required for healthy digestion and filtering of waste in the body.  Fat: Limit fats in your diet- when choosing fats, choose foods with lower fats content such as lean meats (chicken, fish, [place]).  Why: Increased fat intake leads to storage "for later". Once you burn your carbohydrate energy, your body goes into fat and protein breakdown mode to help you loose weight.  Cholesterol: Fats and oils that are LIQUID at room temperature are best. Choose vegetable oils (olive oil, avocado oil, nuts). Avoid fats that are SOLID at room temperature (animal fats, processed meats).  Healthy fats are often found in whole grains, beans, nuts, seeds, and berries.  Why: Elevated cholesterol levels lead to build up of cholesterol on the inside of your blood vessels. This will eventually cause the blood vessels to become hard and can lead to high blood pressure and damage to your organs. When the blood flow is reduced, but the pressure is high from cholesterol buildup, parts of the cholesterol can break off and form clots that can go to the brain or heart leading to a stroke or heart attack.  Fiber: Increase amount of SOLUBLE the fiber in your diet. This helps to fill you up, lowers cholesterol, and helps with digestion. Some foods high in soluble fiber are oats, peas, beans, apples, carrots, barley, and citrus fruits.   Why: Fiber fills you up, helps remove excess cholesterol, and aids in healthy digestion which are all very important in weight management.   I recommend the following as a minimum activity routine: Purposeful walk or other physical activity at least 20 minutes every single day. This means purposefully taking a walk, jog, bike, swim, treadmill, elliptical, dance, etc.  This activity should be ABOVE your normal daily activities, such as walking at work. Goal exercise should be at least 150 minutes a week- work your way up to this.   Heart Rate: Your maximum exercise heart rate should be 220 - Your Age in Years. When exercising, get your heart rate up, but avoid going over the maximum targeted heart  rate.  60-70% of your maximum heart rate is where you tend to burn the most fat. To find this number:  220 - Age In Years= Max HR  Max HR x 0.6 (or 0.7) = Fat Burning HR The Fat Burning HR is your goal heart rate while working out to burn the most fat.  NEVER exercise to the point your feel lightheaded, weak, nauseated, dizzy. If you experience ANY of these symptoms- STOP exercise! Allow yourself to cool down and your heart rate to come down. Then restart slower next time.   If at ANY TIME you feel chest pain or chest pressure during exercise, STOP IMMEDIATELY and seek medical attention.

## 2023-06-26 NOTE — Telephone Encounter (Signed)
Pt called and says she forgot to ask if she can take her insulin before drinking the protein shakes or after?

## 2023-06-27 NOTE — Progress Notes (Incomplete)
  [name], DNP, AGNP-c [facility]  [address] [place], [place] [address] [phone]  ESTABLISHED PATIENT- Chronic Health and/or Follow-Up Visit  Blood pressure 138/84, pulse 67, weight 243 lb 6.4 oz (110.4 kg).    Cynthia [name] is a 67 y.o. year old female presenting today for evaluation and management of her blood pressure.   Tejocote Root for weight loss  Jeananne tells me her home blood pressure readings have remained elevated since the last medication change she had. They have come down some, but review of home readings show consistently 140-160 systolic readings. She expresses frustration over difficulty controlling her blood pressures despite her efforts. She tells me she has not been having headaches as often as she was previously. She is eager to control her blood pressures to help preserve her kidneys and prevent heart attack or stoke. She mentions that her blood pressures were under good control when she was on the regimen of medications prior to the initial change with cardiology.   She also expresses concerns with her blood sugars and tells me that she has been skipping meals in effort to better control her sugar and help with weight management. She reports usually eating one meal a day. She makes an effort to drink plenty of fluids and avoids snacking.   All ROS negative with exception of what is listed above.   PHYSICAL EXAM Physical Exam Vitals and nursing note reviewed.  Constitutional:      General: She is not in acute distress.    Appearance: Normal appearance. She is not ill-appearing.  HENT:     Head: Normocephalic.  Eyes:     Conjunctiva/sclera: Conjunctivae normal.  Neck:     Vascular: No carotid bruit.  Cardiovascular:     Rate and Rhythm: Normal rate and regular rhythm.     Pulses: Normal pulses.     Heart sounds: Normal heart sounds.  Pulmonary:     Effort: Pulmonary effort is normal.     Breath sounds: Normal breath  sounds.  Abdominal:     Palpations: Abdomen is soft.  Musculoskeletal:     Right lower leg: No edema.     Left lower leg: No edema.  Skin:    General: Skin is warm and dry.     Capillary Refill: Capillary refill takes less than 2 seconds.  Neurological:     Mental Status: She is alert and oriented to person, place, and time.  Psychiatric:        Mood and Affect: Mood normal.        Behavior: Behavior normal.     PLAN Problem List Items Addressed This Visit   None   No follow-ups on file.  Time: *** minutes, >50% spent counseling, care coordination, chart review, and documentation.   [name], DNP, AGNP-c

## 2023-06-28 NOTE — Assessment & Plan Note (Addendum)
Lengthy discussion on recommendation of small, frequent high protein low carb snacks throughout the day as opposed to skipping meals for improved blood sugar control and weight management. I also recommend increased fiber daily and plenty of water. Avoid meals and snacks with only carbohydrates, always include protein. We discussed protein shakes as a good low carb/low calorie option as these often can be filling and stabilize sugar for an extended period. I do not recommend the Tejocote Root. This appears to often be mislabeled and contain a different herb that can be harmful to the health

## 2023-06-28 NOTE — Assessment & Plan Note (Signed)
Improvement shown in office today. I feel the additional amlodipine 2.5mg  has been helpful.  ECHO this year shows no changes from 2012 with only mild diastolic dysfunction. Renal US 2022 normal. Aldosterone/Renin 2022 also normal. Extensive chart review today shows multiple visits for difficult to control HTN as far back as 2015 (prior charts unable to be viewed) with multiple medication changes. It appears that occasionally she would have brief control of BP, but this was not sustained for any length of time. Multiple medication changes have not been successful in prolonged control. Kidney function began to initially show decline in 2015, from what I can tell.  Given the extensive history of difficult to control HTN, I suspect that any control achieved will need close monitoring and medication adjustments frequently. Today her BP is better than it has been in a wile. I would like her to continue on her current regimen.  If BP is consistently > 150/90, we will increase amlodipine to 5mg  and monitor.    We will f/u in 4 weeks for repeat evaluation.

## 2023-06-28 NOTE — Assessment & Plan Note (Signed)
No alarm symptoms present at this time. ECHO this year shows no changes from 2012 with only mild diastolic dysfunction. Renal US 2022 normal. Aldosterone/Renin 2022 also normal. Extensive chart review today shows multiple visits for difficult to control HTN as far back as 2015 (prior charts unable to be viewed) with multiple medication changes. It appears that occasionally she would have brief control of BP, but this was not sustained for any length of time. Multiple medication changes have not been successful in prolonged control. Kidney function began to initially show decline in 2015, from what I can tell.  Given the extensive history of difficult to control HTN, I suspect that any control achieved will need close monitoring and medication adjustments frequently. Today her BP is better than it has been in a wile. I would like her to continue on her current regimen.  If BP is consistently > 150/90, we will increase amlodipine to 5mg  and monitor.    We will f/u in 4 weeks for repeat evaluation.

## 2023-06-29 ENCOUNTER — Ambulatory Visit: Payer: PPO | Admitting: Occupational Therapy

## 2023-06-30 DIAGNOSIS — I10 Essential (primary) hypertension: Secondary | ICD-10-CM | POA: Diagnosis not present

## 2023-07-02 ENCOUNTER — Encounter: Payer: Self-pay | Admitting: Nurse Practitioner

## 2023-07-02 DIAGNOSIS — F5101 Primary insomnia: Secondary | ICD-10-CM

## 2023-07-05 MED ORDER — RAMELTEON 8 MG PO TABS
8.0000 mg | ORAL_TABLET | Freq: Every day | ORAL | 1 refills | Status: AC
Start: 2023-07-05 — End: ?

## 2023-07-06 ENCOUNTER — Ambulatory Visit: Payer: PPO | Admitting: Occupational Therapy

## 2023-07-08 ENCOUNTER — Other Ambulatory Visit: Payer: Self-pay | Admitting: Nurse Practitioner

## 2023-07-08 DIAGNOSIS — F5101 Primary insomnia: Secondary | ICD-10-CM

## 2023-07-09 ENCOUNTER — Encounter: Payer: Self-pay | Admitting: Nurse Practitioner

## 2023-07-09 ENCOUNTER — Encounter: Payer: Self-pay | Admitting: Internal Medicine

## 2023-07-12 ENCOUNTER — Ambulatory Visit: Payer: PPO | Admitting: Occupational Therapy

## 2023-07-12 ENCOUNTER — Telehealth: Payer: Self-pay | Admitting: Occupational Therapy

## 2023-07-12 ENCOUNTER — Other Ambulatory Visit (HOSPITAL_BASED_OUTPATIENT_CLINIC_OR_DEPARTMENT_OTHER): Payer: Self-pay | Admitting: Family Medicine

## 2023-07-12 DIAGNOSIS — R519 Headache, unspecified: Secondary | ICD-10-CM

## 2023-07-12 NOTE — Telephone Encounter (Signed)
No show/no call.

## 2023-07-14 ENCOUNTER — Telehealth: Payer: Self-pay | Admitting: Nurse Practitioner

## 2023-07-14 NOTE — Telephone Encounter (Signed)
Completed PA

## 2023-07-14 NOTE — Telephone Encounter (Signed)
P.A. Caro Laroche

## 2023-07-21 NOTE — Telephone Encounter (Signed)
P.A. denied for medically accepted indication & not trying a formulary alternative, completed appeal because pt has tried alternative Alfonso Patten)

## 2023-07-27 DIAGNOSIS — I10 Essential (primary) hypertension: Secondary | ICD-10-CM | POA: Diagnosis not present

## 2023-07-27 DIAGNOSIS — E1100 Type 2 diabetes mellitus with hyperosmolarity without nonketotic hyperglycemic-hyperosmolar coma (NKHHC): Secondary | ICD-10-CM | POA: Diagnosis not present

## 2023-07-27 DIAGNOSIS — G4700 Insomnia, unspecified: Secondary | ICD-10-CM | POA: Diagnosis not present

## 2023-07-27 DIAGNOSIS — I5032 Chronic diastolic (congestive) heart failure: Secondary | ICD-10-CM | POA: Diagnosis not present

## 2023-07-27 DIAGNOSIS — I129 Hypertensive chronic kidney disease with stage 1 through stage 4 chronic kidney disease, or unspecified chronic kidney disease: Secondary | ICD-10-CM | POA: Diagnosis not present

## 2023-07-27 DIAGNOSIS — Z1239 Encounter for other screening for malignant neoplasm of breast: Secondary | ICD-10-CM | POA: Diagnosis not present

## 2023-07-27 DIAGNOSIS — I110 Hypertensive heart disease with heart failure: Secondary | ICD-10-CM | POA: Diagnosis not present

## 2023-08-02 ENCOUNTER — Encounter: Payer: Self-pay | Admitting: Nurse Practitioner

## 2023-08-02 ENCOUNTER — Ambulatory Visit: Payer: PPO | Admitting: Nurse Practitioner

## 2023-08-02 NOTE — Progress Notes (Signed)
No show. Patient established with new provider within the last week. Will remove myself as listed PCP.

## 2023-08-14 DIAGNOSIS — I890 Lymphedema, not elsewhere classified: Secondary | ICD-10-CM | POA: Diagnosis not present

## 2023-08-16 ENCOUNTER — Ambulatory Visit (HOSPITAL_BASED_OUTPATIENT_CLINIC_OR_DEPARTMENT_OTHER)
Admission: RE | Admit: 2023-08-16 | Discharge: 2023-08-16 | Disposition: A | Discharge status: Home / Self Care | Payer: PPO | Source: Ambulatory Visit | Attending: Orthopaedic Surgery | Admitting: Orthopaedic Surgery

## 2023-08-16 ENCOUNTER — Other Ambulatory Visit (HOSPITAL_BASED_OUTPATIENT_CLINIC_OR_DEPARTMENT_OTHER): Payer: Self-pay | Admitting: Nurse Practitioner

## 2023-08-16 DIAGNOSIS — M75121 Complete rotator cuff tear or rupture of right shoulder, not specified as traumatic: Secondary | ICD-10-CM | POA: Diagnosis not present

## 2023-08-16 DIAGNOSIS — M75122 Complete rotator cuff tear or rupture of left shoulder, not specified as traumatic: Secondary | ICD-10-CM | POA: Diagnosis not present

## 2023-08-16 DIAGNOSIS — M7582 Other shoulder lesions, left shoulder: Secondary | ICD-10-CM | POA: Diagnosis present

## 2023-08-16 DIAGNOSIS — M7581 Other shoulder lesions, right shoulder: Secondary | ICD-10-CM

## 2023-08-16 DIAGNOSIS — S46111A Strain of muscle, fascia and tendon of long head of biceps, right arm, initial encounter: Secondary | ICD-10-CM | POA: Diagnosis not present

## 2023-08-16 DIAGNOSIS — S46211A Strain of muscle, fascia and tendon of other parts of biceps, right arm, initial encounter: Secondary | ICD-10-CM | POA: Diagnosis not present

## 2023-08-16 DIAGNOSIS — S46212A Strain of muscle, fascia and tendon of other parts of biceps, left arm, initial encounter: Secondary | ICD-10-CM | POA: Diagnosis not present

## 2023-08-16 DIAGNOSIS — E1165 Type 2 diabetes mellitus with hyperglycemia: Secondary | ICD-10-CM

## 2023-08-16 DIAGNOSIS — S46811A Strain of other muscles, fascia and tendons at shoulder and upper arm level, right arm, initial encounter: Secondary | ICD-10-CM | POA: Diagnosis not present

## 2023-08-16 DIAGNOSIS — S46812A Strain of other muscles, fascia and tendons at shoulder and upper arm level, left arm, initial encounter: Secondary | ICD-10-CM | POA: Diagnosis not present

## 2023-08-16 DIAGNOSIS — E785 Hyperlipidemia, unspecified: Secondary | ICD-10-CM

## 2023-08-21 ENCOUNTER — Other Ambulatory Visit: Payer: Self-pay | Admitting: Nurse Practitioner

## 2023-08-21 DIAGNOSIS — I152 Hypertension secondary to endocrine disorders: Secondary | ICD-10-CM

## 2023-08-27 DIAGNOSIS — E66812 Obesity, class 2: Secondary | ICD-10-CM | POA: Diagnosis not present

## 2023-08-27 DIAGNOSIS — Z6839 Body mass index (BMI) 39.0-39.9, adult: Secondary | ICD-10-CM | POA: Diagnosis not present

## 2023-08-27 DIAGNOSIS — I10 Essential (primary) hypertension: Secondary | ICD-10-CM | POA: Diagnosis not present

## 2023-08-28 DIAGNOSIS — Z1231 Encounter for screening mammogram for malignant neoplasm of breast: Secondary | ICD-10-CM | POA: Diagnosis not present

## 2023-08-29 ENCOUNTER — Telehealth: Payer: Self-pay

## 2023-08-29 ENCOUNTER — Ambulatory Visit (HOSPITAL_BASED_OUTPATIENT_CLINIC_OR_DEPARTMENT_OTHER): Payer: PPO | Admitting: Orthopaedic Surgery

## 2023-08-29 ENCOUNTER — Ambulatory Visit (HOSPITAL_BASED_OUTPATIENT_CLINIC_OR_DEPARTMENT_OTHER): Payer: Self-pay | Admitting: Orthopaedic Surgery

## 2023-08-29 ENCOUNTER — Other Ambulatory Visit (HOSPITAL_BASED_OUTPATIENT_CLINIC_OR_DEPARTMENT_OTHER): Payer: Self-pay

## 2023-08-29 ENCOUNTER — Encounter (HOSPITAL_BASED_OUTPATIENT_CLINIC_OR_DEPARTMENT_OTHER): Payer: Self-pay | Admitting: Orthopaedic Surgery

## 2023-08-29 DIAGNOSIS — M7582 Other shoulder lesions, left shoulder: Secondary | ICD-10-CM | POA: Diagnosis not present

## 2023-08-29 DIAGNOSIS — M7581 Other shoulder lesions, right shoulder: Secondary | ICD-10-CM | POA: Diagnosis not present

## 2023-08-29 DIAGNOSIS — I129 Hypertensive chronic kidney disease with stage 1 through stage 4 chronic kidney disease, or unspecified chronic kidney disease: Secondary | ICD-10-CM | POA: Diagnosis not present

## 2023-08-29 DIAGNOSIS — I1A0 Resistant hypertension: Secondary | ICD-10-CM | POA: Diagnosis not present

## 2023-08-29 DIAGNOSIS — E119 Type 2 diabetes mellitus without complications: Secondary | ICD-10-CM | POA: Diagnosis not present

## 2023-08-29 DIAGNOSIS — N189 Chronic kidney disease, unspecified: Secondary | ICD-10-CM | POA: Diagnosis not present

## 2023-08-29 MED ORDER — ACETAMINOPHEN 500 MG PO TABS
500.0000 mg | ORAL_TABLET | Freq: Three times a day (TID) | ORAL | 0 refills | Status: AC
  Filled 2023-08-29: qty 30, 10d supply, fill #0

## 2023-08-29 MED ORDER — OXYCODONE HCL 5 MG PO TABS
5.0000 mg | ORAL_TABLET | ORAL | 0 refills | Status: DC | PRN
  Filled 2023-08-29: qty 15, 3d supply, fill #0

## 2023-08-29 MED ORDER — ASPIRIN 325 MG PO TBEC
325.0000 mg | DELAYED_RELEASE_TABLET | Freq: Every day | ORAL | 0 refills | Status: DC
  Filled 2023-08-29: qty 30, 30d supply, fill #0

## 2023-08-29 NOTE — Telephone Encounter (Signed)
Appointment has been cancelled.

## 2023-08-29 NOTE — Telephone Encounter (Signed)
Spoke with patient who states that she has told someone that she is now seeing a new cardiologist, Dr. Markham Jordan from [facility]. Patient also states that she would like me to cancel her appt with Dr. Alex Gardener on 09/26/23 because she will no longer be seeing her.
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
## 2023-08-29 NOTE — Telephone Encounter (Signed)
Name: Cynthia [name]  DOB: 07-03-1956  MRN: [id number]  Primary Cardiologist: None  Chart reviewed as part of pre-operative protocol coverage. Because of [name]'s past medical history and time since last visit, she will require a follow-up telephone visit in order to better assess preoperative cardiovascular risk.  Pre-op covering staff: - Please schedule appointment and call patient to inform them. If patient already had an upcoming appointment within acceptable timeframe, please add "pre-op clearance" to the appointment notes so provider is aware. - Please contact requesting surgeon's office via preferred method (i.e, phone, fax) to inform them of need for appointment prior to surgery.  ASA is not prescribed by [facility]. Would recommend holding parameters to be determined by prescribing provider.  Sharlene Dory, PA-C  08/29/2023, 4:29 PM

## 2023-08-29 NOTE — Telephone Encounter (Signed)
Pre-operative Risk Assessment    Patient Name: Cynthia [name]  DOB: 07/21/1956 MRN: [id number]   LAST OV: 04/04/23 WITH DR. [place] NEXT OV: 09/26/23 WITH DR. [place]     Request for Surgical Clearance    Procedure:   LEFT REVERSE SHOULDER ARTHOPLASTY   Date of Surgery:  Clearance TBD                                 Surgeon:  DR. Huel Cote Surgeon's Group or Practice Name:  [facility] [place] Phone number:  [phone] Fax number:  [phone]   Type of Clearance Requested:   - Medical  - Pharmacy:  Hold Aspirin NEEDS INSTRUCTIONS    Type of Anesthesia:  General    Additional requests/questions:    Signed, Michaelle Copas   08/29/2023, 4:20 PM

## 2023-08-30 ENCOUNTER — Telehealth: Payer: Self-pay | Admitting: Orthopedic Surgery

## 2023-08-30 DIAGNOSIS — I1A0 Resistant hypertension: Secondary | ICD-10-CM | POA: Diagnosis not present

## 2023-08-30 DIAGNOSIS — I129 Hypertensive chronic kidney disease with stage 1 through stage 4 chronic kidney disease, or unspecified chronic kidney disease: Secondary | ICD-10-CM | POA: Diagnosis not present

## 2023-08-30 DIAGNOSIS — N189 Chronic kidney disease, unspecified: Secondary | ICD-10-CM | POA: Diagnosis not present

## 2023-09-03 NOTE — Telephone Encounter (Signed)
Ramelteon (the generic) is covered but only 15 day supply, pt has picked up 15 day supply & has transferred out

## 2023-09-04 ENCOUNTER — Ambulatory Visit
Admission: RE | Admit: 2023-09-04 | Discharge: 2023-09-04 | Disposition: A | Discharge status: Home / Self Care | Payer: 59 | Source: Ambulatory Visit | Attending: Orthopaedic Surgery | Admitting: Orthopaedic Surgery

## 2023-09-04 DIAGNOSIS — M7581 Other shoulder lesions, right shoulder: Secondary | ICD-10-CM

## 2023-09-05 ENCOUNTER — Other Ambulatory Visit (HOSPITAL_BASED_OUTPATIENT_CLINIC_OR_DEPARTMENT_OTHER): Payer: Self-pay | Admitting: Orthopaedic Surgery

## 2023-09-05 MED ORDER — OXYCODONE HCL 5 MG PO TABS
5.0000 mg | ORAL_TABLET | ORAL | 0 refills | Status: DC | PRN
Start: 2023-09-05 — End: 2023-10-17

## 2023-09-05 MED ORDER — TRAMADOL HCL 50 MG PO TABS
50.0000 mg | ORAL_TABLET | Freq: Four times a day (QID) | ORAL | 2 refills | Status: DC | PRN
Start: 2023-09-05 — End: 2024-04-03

## 2023-09-06 DIAGNOSIS — M75121 Complete rotator cuff tear or rupture of right shoulder, not specified as traumatic: Secondary | ICD-10-CM | POA: Diagnosis not present

## 2023-09-06 DIAGNOSIS — M75122 Complete rotator cuff tear or rupture of left shoulder, not specified as traumatic: Secondary | ICD-10-CM | POA: Diagnosis not present

## 2023-09-12 ENCOUNTER — Other Ambulatory Visit: Payer: PPO

## 2023-09-12 ENCOUNTER — Other Ambulatory Visit: Payer: Self-pay | Admitting: Nurse Practitioner

## 2023-09-12 DIAGNOSIS — Z780 Asymptomatic menopausal state: Secondary | ICD-10-CM

## 2023-09-21 ENCOUNTER — Encounter: Payer: Self-pay | Admitting: Internal Medicine

## 2023-09-21 ENCOUNTER — Ambulatory Visit (INDEPENDENT_AMBULATORY_CARE_PROVIDER_SITE_OTHER): Payer: PPO | Admitting: Internal Medicine

## 2023-09-21 VITALS — BP 124/80 | HR 75 | Ht 66.0 in | Wt 235.0 lb

## 2023-09-21 DIAGNOSIS — Z794 Long term (current) use of insulin: Secondary | ICD-10-CM

## 2023-09-21 DIAGNOSIS — E1165 Type 2 diabetes mellitus with hyperglycemia: Secondary | ICD-10-CM | POA: Diagnosis not present

## 2023-09-21 LAB — POCT GLYCOSYLATED HEMOGLOBIN (HGB A1C): Hemoglobin A1C: 7 % — AB (ref 4.0–5.6)
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
## 2023-09-21 NOTE — Patient Instructions (Addendum)
-  Continue Farxiga 10 mg daily - Decrease Lantus  20 units daily  - Take Novolog 8 units with each meal  - Novolog correctional insulin: ADD extra units on insulin to your meal-time Novolog dose if your blood sugars are higher than 160. Use the scale below to help guide you Three times a day ( breakfast, lunch and Supper)  Blood sugar before meal Number of units to inject  Less than 160 0 unit  161 -  190 1 units  191 -  220 2 units  221 -  250 3 units  251 -  280 4 units  281 -  310 5 units  311 -  340 6 units  341 -  370 7 units   Apply capsaicin cream to the feet at night, make sure you wash your hands thoroughly after use.  HOW TO TREAT LOW BLOOD SUGARS (Blood sugar LESS THAN 70 MG/DL) Please follow the RULE OF 15 for the treatment of hypoglycemia treatment (when your (blood sugars are less than 70 mg/dL)   STEP 1: Take 15 grams of carbohydrates when your blood sugar is low, which includes:  3-4 GLUCOSE TABS  OR 3-4 OZ OF JUICE OR REGULAR SODA OR ONE TUBE OF GLUCOSE GEL    STEP 2: RECHECK blood sugar in 15 MINUTES STEP 3: If your blood sugar is still low at the 15 minute recheck --> then, go back to STEP 1 and treat AGAIN with another 15 grams of carbohydrates.

## 2023-09-26 ENCOUNTER — Encounter (HOSPITAL_BASED_OUTPATIENT_CLINIC_OR_DEPARTMENT_OTHER): Payer: PPO | Admitting: Cardiovascular Disease

## 2023-09-26 ENCOUNTER — Other Ambulatory Visit: Payer: Self-pay | Admitting: Nurse Practitioner

## 2023-09-26 DIAGNOSIS — Z780 Asymptomatic menopausal state: Secondary | ICD-10-CM

## 2023-09-27 ENCOUNTER — Encounter (HOSPITAL_BASED_OUTPATIENT_CLINIC_OR_DEPARTMENT_OTHER): Payer: Self-pay | Admitting: Orthopaedic Surgery

## 2023-10-02 ENCOUNTER — Encounter (HOSPITAL_BASED_OUTPATIENT_CLINIC_OR_DEPARTMENT_OTHER): Payer: Self-pay | Admitting: Orthopaedic Surgery

## 2023-10-05 ENCOUNTER — Other Ambulatory Visit: Payer: Self-pay | Admitting: Orthopaedic Surgery

## 2023-10-05 MED ORDER — ASPIRIN 325 MG PO TBEC: 325.0000 mg | DELAYED_RELEASE_TABLET | Freq: Every day | ORAL | 0 refills | Status: DC

## 2023-10-05 NOTE — Progress Notes (Signed)
Surgical Instructions   Your procedure is scheduled on Monday, December 2nd, 2024. Report to [facility] Main Entrance "A" at 1:15 P.M., then check in with the Admitting office. Any questions or running late day of surgery: call [phone]  Questions prior to your surgery date: call [phone], Monday-Friday, 8am-4pm. If you experience any cold or flu symptoms such as cough, fever, chills, shortness of breath, etc. between now and your scheduled surgery, please notify us at the above number.     Remember:  Do not eat after midnight the night before your surgery  You may drink clear liquids until 12:15 the day of your surgery.   Clear liquids allowed are: Water, Non-Citrus Juices (without pulp), Carbonated Beverages, Clear Tea (no milk, honey, etc.), Black Coffee Only (NO MILK, CREAM OR POWDERED CREAMER of any kind), and Gatorade.  Patient Instructions  The night before surgery:  No food after midnight. ONLY clear liquids after midnight   The day of surgery (if you have diabetes): Drink ONE (1) 12 oz G2 given to you in your pre admission testing appointment by 12:15 the day of surgery. Drink in one sitting. Do not sip.  This drink was given to you during your hospital  pre-op appointment visit.  Nothing else to drink after completing the  12 oz bottle of G2.         If you have questions, please contact your surgeon's office.     Take these medicines the morning of surgery with A SIP OF WATER: Amlodipine (Norvasc) Metoprolol Tartrate (Lopressor)   May take these medicines IF NEEDED: Oxycodone (Roxicodone) Rizatriptan (Maxalt) Tizanidine (Zanaflex)   One week prior to surgery, STOP taking any Aspirin (unless otherwise instructed by your surgeon) Aleve, Naproxen, Ibuprofen, Motrin, Advil, Goody's, BC's, all herbal medications, fish oil, and non-prescription vitamins.   WHAT DO I DO ABOUT MY DIABETES MEDICATION?   Do not take oral diabetes medicines (pills) the  morning of surgery. Marcelline Deist needs to be held for 72 hours prior to surgery - last dose - November 28th.  THE NIGHT BEFORE SURGERY, take __10__ units of ___Lantus___insulin. Do not take Novolog the night before surgery.         THE MORNING OF SURGERY, if your CBG is greater than 220 mg/dL, then you may take 4 units of Novolog.    The day of surgery, do not take other diabetes injectables, including Byetta (exenatide), Bydureon (exenatide ER), Victoza (liraglutide), or Trulicity (dulaglutide).    HOW TO MANAGE YOUR DIABETES BEFORE AND AFTER SURGERY  Why is it important to control my blood sugar before and after surgery? Improving blood sugar levels before and after surgery helps healing and can limit problems. A way of improving blood sugar control is eating a healthy diet by:  Eating less sugar and carbohydrates  Increasing activity/exercise  Talking with your doctor about reaching your blood sugar goals High blood sugars (greater than 180 mg/dL) can raise your risk of infections and slow your recovery, so you will need to focus on controlling your diabetes during the weeks before surgery. Make sure that the doctor who takes care of your diabetes knows about your planned surgery including the date and location.  How do I manage my blood sugar before surgery? Check your blood sugar at least 4 times a day, starting 2 days before surgery, to make sure that the level is not too high or low.  Check your blood sugar the morning of your surgery when you wake up and  every 2 hours until you get to the Short Stay unit.  If your blood sugar is less than 70 mg/dL, you will need to treat for low blood sugar: Do not take insulin. Treat a low blood sugar (less than 70 mg/dL) with  cup of clear juice (cranberry or apple), 4 glucose tablets, OR glucose gel. Recheck blood sugar in 15 minutes after treatment (to make sure it is greater than 70 mg/dL). If your blood sugar is not greater than 70 mg/dL on  recheck, call [phone] for further instructions. Report your blood sugar to the short stay nurse when you get to Short Stay.  If you are admitted to the hospital after surgery: Your blood sugar will be checked by the staff and you will probably be given insulin after surgery (instead of oral diabetes medicines) to make sure you have good blood sugar levels. The goal for blood sugar control after surgery is 80-180 mg/dL.                      Do NOT Smoke (Tobacco/Vaping) for 24 hours prior to your procedure.  If you use a CPAP at night, you may bring your mask/headgear for your overnight stay.   You will be asked to remove any contacts, glasses, piercing's, hearing aid's, dentures/partials prior to surgery. Please bring cases for these items if needed.    Patients discharged the day of surgery will not be allowed to drive home, and someone needs to stay with them for 24 hours.  SURGICAL WAITING ROOM VISITATION Patients may have no more than 2 support people in the waiting area - these visitors may rotate.   Pre-op nurse will coordinate an appropriate time for 1 ADULT support person, who may not rotate, to accompany patient in pre-op.  Children under the age of 41 must have an adult with them who is not the patient and must remain in the main waiting area with an adult.  If the patient needs to stay at the hospital during part of their recovery, the visitor guidelines for inpatient rooms apply.  Please refer to the [facility] website for the visitor guidelines for any additional information.   If you received a COVID test during your pre-op visit  it is requested that you wear a mask when out in public, stay away from anyone that may not be feeling well and notify your surgeon if you develop symptoms. If you have been in contact with anyone that has tested positive in the last 10 days please notify you surgeon.   Oral Hygiene is also important to reduce your risk of infection.   Remember - BRUSH YOUR TEETH THE MORNING OF SURGERY WITH YOUR REGULAR TOOTHPASTE  [place]- Preparing for Total Shoulder Arthroplasty  Before surgery, you can play an important role. Because skin is not sterile, your skin needs to be as free of germs as possible. You can reduce the number of germs on your skin by using the following products.   Benzoyl Peroxide Gel  o Reduces the number of germs present on the skin  o Applied twice a day to shoulder area starting two days before surgery   Chlorhexidine Gluconate (CHG) Soap (instructions listed above on how to wash with CHG Soap)  o An antiseptic cleaner that kills germs and bonds with the skin to continue killing germs even after washing  o Used for showering the night before surgery and morning of surgery   ==================================================================  Please follow  these instructions carefully:  BENZOYL PEROXIDE 5% GEL  Please do not use if you have an allergy to benzoyl peroxide. If your skin becomes reddened/irritated stop using the benzoyl peroxide.  Starting two days before surgery, apply as follows:  1. Apply benzoyl peroxide in the morning and at night. Apply after taking a shower. If you are not taking a shower clean entire shoulder front, back, and side along with the armpit with a clean wet washcloth.  2. Place a quarter-sized dollop on your SHOULDER and rub in thoroughly, making sure to cover the front, back, and side of your shoulder, along with the armpit.   2 Days prior to Surgery First Dose on _____________ Morning Second Dose on ______________ Night  Day Before Surgery First Dose on ______________ Morning Night before surgery wash (entire body except face and private areas) with CHG Soap THEN Second Dose on ____________ Night   Morning of Surgery  wash BODY AGAIN with CHG Soap   4. Do NOT apply benzoyl peroxide gel on the day of surgery          Pre-operative 5 CHG  Bathing Instructions   You can play a key role in reducing the risk of infection after surgery. Your skin needs to be as free of germs as possible. You can reduce the number of germs on your skin by washing with CHG (chlorhexidine gluconate) soap before surgery. CHG is an antiseptic soap that kills germs and continues to kill germs even after washing.   DO NOT use if you have an allergy to chlorhexidine/CHG or antibacterial soaps. If your skin becomes reddened or irritated, stop using the CHG and notify one of our RNs at [phone].   Please shower with the CHG soap starting 4 days before surgery using the following schedule:     Please keep in mind the following:  DO NOT shave, including legs and underarms, starting the day of your first shower.   You may shave your face at any point before/day of surgery.  Place clean sheets on your bed the day you start using CHG soap. Use a clean washcloth (not used since being washed) for each shower. DO NOT sleep with pets once you start using the CHG.   CHG Shower Instructions:  Wash your face and private area with normal soap. If you choose to wash your hair, wash first with your normal shampoo.  After you use shampoo/soap, rinse your hair and body thoroughly to remove shampoo/soap residue.  Turn the water OFF and apply about 3 tablespoons (45 ml) of CHG soap to a CLEAN washcloth.  Apply CHG soap ONLY FROM YOUR NECK DOWN TO YOUR TOES (washing for 3-5 minutes)  DO NOT use CHG soap on face, private areas, open wounds, or sores.  Pay special attention to the area where your surgery is being performed.  If you are having back surgery, having someone wash your back for you may be helpful. Wait 2 minutes after CHG soap is applied, then you may rinse off the CHG soap.  Pat dry with a clean towel  Put on clean clothes/pajamas   If you choose to wear lotion, please use ONLY the CHG-compatible lotions on the back of this paper.   Additional instructions  for the day of surgery: DO NOT APPLY any lotions, deodorants, cologne, or perfumes.   Do not bring valuables to the hospital. [facility] is not responsible for any belongings/valuables. Do not wear nail polish, gel polish, artificial nails, or any  other type of covering on natural nails (fingers and toes) Do not wear jewelry or makeup Put on clean/comfortable clothes.  Please brush your teeth.  Ask your nurse before applying any prescription medications to the skin.     CHG Compatible Lotions   Aveeno Moisturizing lotion  Cetaphil Moisturizing Cream  Cetaphil Moisturizing Lotion  Clairol Herbal Essence Moisturizing Lotion, Dry Skin  Clairol Herbal Essence Moisturizing Lotion, Extra Dry Skin  Clairol Herbal Essence Moisturizing Lotion, Normal Skin  Curel Age Defying Therapeutic Moisturizing Lotion with Alpha Hydroxy  Curel Extreme Care Body Lotion  Curel Soothing Hands Moisturizing Hand Lotion  Curel Therapeutic Moisturizing Cream, Fragrance-Free  Curel Therapeutic Moisturizing Lotion, Fragrance-Free  Curel Therapeutic Moisturizing Lotion, Original Formula  Eucerin Daily Replenishing Lotion  Eucerin Dry Skin Therapy Plus Alpha Hydroxy Crme  Eucerin Dry Skin Therapy Plus Alpha Hydroxy Lotion  Eucerin Original Crme  Eucerin Original Lotion  Eucerin Plus Crme Eucerin Plus Lotion  Eucerin TriLipid Replenishing Lotion  Keri Anti-Bacterial Hand Lotion  Keri Deep Conditioning Original Lotion Dry Skin Formula Softly Scented  Keri Deep Conditioning Original Lotion, Fragrance Free Sensitive Skin Formula  Keri Lotion Fast Absorbing Fragrance Free Sensitive Skin Formula  Keri Lotion Fast Absorbing Softly Scented Dry Skin Formula  Keri Original Lotion  Keri Skin Renewal Lotion Keri Silky Smooth Lotion  Keri Silky Smooth Sensitive Skin Lotion  Nivea Body Creamy Conditioning Oil  Nivea Body Extra Enriched Lotion  Nivea Body Original Lotion  Nivea Body Sheer Moisturizing Lotion Nivea  Crme  Nivea Skin Firming Lotion  NutraDerm 30 Skin Lotion  NutraDerm Skin Lotion  NutraDerm Therapeutic Skin Cream  NutraDerm Therapeutic Skin Lotion  ProShield Protective Hand Cream  Provon moisturizing lotion  Please read over the following fact sheets that you were given.

## 2023-10-08 ENCOUNTER — Encounter (HOSPITAL_COMMUNITY)
Admission: RE | Admit: 2023-10-08 | Discharge: 2023-10-08 | Disposition: A | Discharge status: Home / Self Care | Payer: PPO | Source: Ambulatory Visit

## 2023-10-08 ENCOUNTER — Other Ambulatory Visit: Payer: Self-pay

## 2023-10-08 ENCOUNTER — Encounter (HOSPITAL_COMMUNITY): Payer: Self-pay

## 2023-10-08 VITALS — BP 120/74 | HR 63 | Temp 98.4°F | Resp 18 | Ht 66.0 in | Wt 242.2 lb

## 2023-10-08 DIAGNOSIS — D631 Anemia in chronic kidney disease: Secondary | ICD-10-CM | POA: Diagnosis not present

## 2023-10-08 DIAGNOSIS — I130 Hypertensive heart and chronic kidney disease with heart failure and stage 1 through stage 4 chronic kidney disease, or unspecified chronic kidney disease: Secondary | ICD-10-CM | POA: Diagnosis not present

## 2023-10-08 DIAGNOSIS — I890 Lymphedema, not elsewhere classified: Secondary | ICD-10-CM | POA: Diagnosis not present

## 2023-10-08 DIAGNOSIS — Z794 Long term (current) use of insulin: Secondary | ICD-10-CM | POA: Insufficient documentation

## 2023-10-08 DIAGNOSIS — I1A0 Resistant hypertension: Secondary | ICD-10-CM | POA: Diagnosis not present

## 2023-10-08 DIAGNOSIS — I5030 Unspecified diastolic (congestive) heart failure: Secondary | ICD-10-CM | POA: Insufficient documentation

## 2023-10-08 DIAGNOSIS — E1122 Type 2 diabetes mellitus with diabetic chronic kidney disease: Secondary | ICD-10-CM | POA: Insufficient documentation

## 2023-10-08 DIAGNOSIS — Z79899 Other long term (current) drug therapy: Secondary | ICD-10-CM | POA: Insufficient documentation

## 2023-10-08 DIAGNOSIS — G4733 Obstructive sleep apnea (adult) (pediatric): Secondary | ICD-10-CM | POA: Diagnosis not present

## 2023-10-08 DIAGNOSIS — Z01812 Encounter for preprocedural laboratory examination: Secondary | ICD-10-CM | POA: Diagnosis present

## 2023-10-08 DIAGNOSIS — N183 Chronic kidney disease, stage 3 unspecified: Secondary | ICD-10-CM | POA: Insufficient documentation

## 2023-10-08 DIAGNOSIS — Z01818 Encounter for other preprocedural examination: Secondary | ICD-10-CM

## 2023-10-08 DIAGNOSIS — K861 Other chronic pancreatitis: Secondary | ICD-10-CM | POA: Diagnosis not present

## 2023-10-08 LAB — BASIC METABOLIC PANEL
Anion gap: 5 (ref 5–15)
BUN: 20 mg/dL (ref 8–23)
CO2: 29 mmol/L (ref 22–32)
Calcium: 9.4 mg/dL (ref 8.9–10.3)
Chloride: 102 mmol/L (ref 98–111)
Creatinine, Ser: 1.41 mg/dL — ABNORMAL HIGH (ref 0.44–1.00)
GFR, Estimated: 41 mL/min — ABNORMAL LOW (ref >60)
Glucose, Bld: 109 mg/dL — ABNORMAL HIGH (ref 70–99)
Potassium: 3.9 mmol/L (ref 3.5–5.1)
Sodium: 136 mmol/L (ref 135–145)

## 2023-10-08 LAB — CBC
HCT: 37 % (ref 36.0–46.0)
Hemoglobin: 11.5 g/dL — ABNORMAL LOW (ref 12.0–15.0)
MCH: 27.3 pg (ref 26.0–34.0)
MCHC: 31.1 g/dL (ref 30.0–36.0)
MCV: 87.7 fL (ref 80.0–100.0)
Platelets: 263 10*3/uL (ref 150–400)
RBC: 4.22 MIL/uL (ref 3.87–5.11)
RDW: 14 % (ref 11.5–15.5)
WBC: 4.8 10*3/uL (ref 4.0–10.5)
nRBC: 0 % (ref 0.0–0.2)

## 2023-10-08 LAB — SURGICAL PCR SCREEN
MRSA, PCR: NEGATIVE
Staphylococcus aureus: POSITIVE — AB

## 2023-10-08 LAB — NO BLOOD PRODUCTS

## 2023-10-08 LAB — GLUCOSE, CAPILLARY: Glucose-Capillary: 128 mg/dL — ABNORMAL HIGH (ref 70–99)

## 2023-10-08 NOTE — Progress Notes (Signed)
PCP - Dr. Mikle Bosworth Cardiologist - Dr. Brooke Bonito (was Dr. Chilton Si)     Clearance note in media  PPM/ICD - denies Device Orders - n/a Rep Notified - n/a  Chest x-ray - 05/2023 EKG - 06/05/23 Stress Test - 2012 ECHO - 04/20/23 Cardiac Cath - denies  Sleep Study - 2022 CPAP - dnies  Fasting Blood Sugar - 90s-110 Checks Blood Sugar __4___ times a day - wears Libre on right arm  Last dose of Farxiga on 11/28   Blood Thinner Instructions: n/a Aspirin Instructions: patient was prescribed 325 mg Aspirin for post surgery and began taking it preoperatively.  Confirmed with Dr. Serena Croissant office in PAT appointment that patient needed to stop taking 325 mg Aspirin.  Patient aware to stop taking 325 mg Aspirin today.   ERAS Protcol - clears until 1215 PRE-SURGERY Ensure or G2-  G2  COVID TEST- n/a   Anesthesia review: yes - HTN, CAD  Patient denies shortness of breath, fever, cough and chest pain at PAT appointment   All instructions explained to the patient, with a verbal understanding of the material. Patient agrees to go over the instructions while at home for a better understanding. Patient also instructed to self quarantine after being tested for COVID-19. The opportunity to ask questions was provided.

## 2023-10-09 NOTE — Anesthesia Preprocedure Evaluation (Addendum)
Anesthesia Evaluation  Patient identified by MRN, date of birth, ID band Patient awake    Reviewed: Allergy & Precautions, NPO status , Patient's Chart, lab work & pertinent test results, reviewed documented beta blocker date and time   History of Anesthesia Complications Negative for: history of anesthetic complications  Airway Mallampati: I  TM Distance: >3 FB Neck ROM: Full    Dental  (+) Dental Advisory Given   Pulmonary sleep apnea (does not use CPAP) , former smoker   breath sounds clear to auscultation       Cardiovascular hypertension, Pt. on medications and Pt. on home beta blockers (-) angina  Rhythm:Regular Rate:Normal  04/2023 ECHO: EF 60-65%, normal LVF, Grade 1 DD, normal RVF, trivial MR   Neuro/Psych  Headaches  Anxiety Depression       GI/Hepatic negative GI ROS, Neg liver ROS,,,  Endo/Other  diabetes (glu 128), Insulin Dependent  Class 3 obesity (BMI 39)  Renal/GU Renal InsufficiencyRenal disease     Musculoskeletal  (+) Arthritis ,  Fibromyalgia -  Abdominal   Peds  Hematology  (+) REFUSES BLOOD PRODUCTS, JEHOVAH'S WITNESS  Anesthesia Other Findings   Reproductive/Obstetrics                             Anesthesia Physical Anesthesia Plan  ASA: 3  Anesthesia Plan: General   Post-op Pain Management: Regional block* and Tylenol PO (pre-op)*   Induction: Intravenous  PONV Risk Score and Plan: 3 and Ondansetron, Dexamethasone and Treatment may vary due to age or medical condition  Airway Management Planned: Oral ETT  Additional Equipment: None  Intra-op Plan:   Post-operative Plan: Extubation in OR  Informed Consent: I have reviewed the patients History and Physical, chart, labs and discussed the procedure including the risks, benefits and alternatives for the proposed anesthesia with the patient or authorized representative who has indicated his/her understanding  and acceptance.     Dental advisory given  Plan Discussed with: CRNA and Surgeon  Anesthesia Plan Comments: (Plan routine monitors, GETA with interscalene block for post op analgesia  PAT note by Antionette Poles, PA-C: 67 year old female follows with cardiology at [facility] for history of resistant hypertension, HFpEF.  She is currently maintained on amlodipine, metoprolol, losartan/hydrochlorothiazide.  Clearance from Dr. 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  EKG 06/05/2023: NSR.  Rate 71.  Moderate voltage criteria for LVH.  Possible anterior infarct, age undetermined.  TTE 04/20/2023: 1. Left ventricular ejection fraction, by estimation, is 60 to 65%. The  left ventricle has normal function. The left ventricle has no regional  wall motion abnormalities. Left ventricular diastolic parameters are  consistent with Grade I diastolic  dysfunction (impaired relaxation). Elevated left ventricular end-diastolic  pressure. The average left ventricular global longitudinal strain is -15.8  %. The global longitudinal strain is abnormal.  2. Right ventricular systolic function is normal. The right ventricular  size is normal. There is normal pulmonary artery systolic pressure.  3. The mitral valve is normal in structure. Trivial mitral valve  regurgitation. No evidence of mitral stenosis.  4. The aortic valve is tricuspid. Aortic valve regurgitation is trivial.  No aortic stenosis is present.  5. The inferior vena cava  is normal in size with greater than 50%  respiratory variability, suggesting right atrial pressure of 3 mmHg.   )        Anesthesia Quick Evaluation

## 2023-10-09 NOTE — Progress Notes (Signed)
Anesthesia Chart Review:  67 year old female follows with cardiology at [facility] for history of resistant hypertension, HFpEF.  She is currently maintained on amlodipine, metoprolol, losartan/hydrochlorothiazide.  Clearance from Dr. 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  EKG 06/05/2023: NSR.  Rate 71.  Moderate voltage criteria for LVH.  Possible anterior infarct, age undetermined.  TTE 04/20/2023:  1. Left ventricular ejection fraction, by estimation, is 60 to 65%. The  left ventricle has normal function. The left ventricle has no regional  wall motion abnormalities. Left ventricular diastolic parameters are  consistent with Grade I diastolic  dysfunction (impaired relaxation). Elevated left ventricular end-diastolic  pressure. The average left ventricular global longitudinal strain is -15.8  %. The global longitudinal strain is abnormal.   2. Right ventricular systolic function is normal. The right ventricular  size is normal. There is normal pulmonary artery systolic pressure.   3. The mitral valve is normal in structure. Trivial mitral valve  regurgitation. No evidence of mitral stenosis.   4. The aortic valve is tricuspid. Aortic valve regurgitation is trivial.  No aortic stenosis is present.   5. The inferior vena cava is normal in size with greater than 50%  respiratory variability, suggesting right atrial pressure of 3 mmHg.     Zannie Cove [facility] Short Stay Center/Anesthesiology Phone [phone] 10/09/2023 2:40 PM

## 2023-10-15 ENCOUNTER — Ambulatory Visit (HOSPITAL_COMMUNITY)
Admission: RE | Admit: 2023-10-15 | Discharge: 2023-10-15 | Disposition: A | Discharge status: Home / Self Care | Payer: PPO | Attending: Orthopaedic Surgery | Admitting: Orthopaedic Surgery

## 2023-10-15 ENCOUNTER — Other Ambulatory Visit: Payer: Self-pay

## 2023-10-15 ENCOUNTER — Encounter (HOSPITAL_COMMUNITY)
Admission: RE | Disposition: A | Discharge status: Home / Self Care | Payer: Self-pay | Source: Home / Self Care | Attending: Orthopaedic Surgery

## 2023-10-15 ENCOUNTER — Ambulatory Visit (HOSPITAL_COMMUNITY): Payer: PPO | Admitting: Physician Assistant

## 2023-10-15 ENCOUNTER — Ambulatory Visit (HOSPITAL_BASED_OUTPATIENT_CLINIC_OR_DEPARTMENT_OTHER): Payer: 59 | Admitting: Anesthesiology

## 2023-10-15 ENCOUNTER — Ambulatory Visit (HOSPITAL_COMMUNITY): Payer: PPO

## 2023-10-15 ENCOUNTER — Encounter (HOSPITAL_COMMUNITY): Payer: Self-pay | Admitting: Orthopaedic Surgery

## 2023-10-15 DIAGNOSIS — Z01818 Encounter for other preprocedural examination: Secondary | ICD-10-CM

## 2023-10-15 DIAGNOSIS — N183 Chronic kidney disease, stage 3 unspecified: Secondary | ICD-10-CM | POA: Insufficient documentation

## 2023-10-15 DIAGNOSIS — M19012 Primary osteoarthritis, left shoulder: Secondary | ICD-10-CM | POA: Diagnosis not present

## 2023-10-15 DIAGNOSIS — Z794 Long term (current) use of insulin: Secondary | ICD-10-CM | POA: Insufficient documentation

## 2023-10-15 DIAGNOSIS — I5032 Chronic diastolic (congestive) heart failure: Secondary | ICD-10-CM | POA: Diagnosis not present

## 2023-10-15 DIAGNOSIS — E1122 Type 2 diabetes mellitus with diabetic chronic kidney disease: Secondary | ICD-10-CM | POA: Diagnosis not present

## 2023-10-15 DIAGNOSIS — I130 Hypertensive heart and chronic kidney disease with heart failure and stage 1 through stage 4 chronic kidney disease, or unspecified chronic kidney disease: Secondary | ICD-10-CM | POA: Diagnosis not present

## 2023-10-15 DIAGNOSIS — Z7984 Long term (current) use of oral hypoglycemic drugs: Secondary | ICD-10-CM | POA: Insufficient documentation

## 2023-10-15 DIAGNOSIS — Z79899 Other long term (current) drug therapy: Secondary | ICD-10-CM | POA: Insufficient documentation

## 2023-10-15 DIAGNOSIS — M129 Arthropathy, unspecified: Secondary | ICD-10-CM | POA: Insufficient documentation

## 2023-10-15 DIAGNOSIS — D631 Anemia in chronic kidney disease: Secondary | ICD-10-CM | POA: Insufficient documentation

## 2023-10-15 DIAGNOSIS — G4733 Obstructive sleep apnea (adult) (pediatric): Secondary | ICD-10-CM | POA: Diagnosis not present

## 2023-10-15 DIAGNOSIS — M12812 Other specific arthropathies, not elsewhere classified, left shoulder: Secondary | ICD-10-CM

## 2023-10-15 DIAGNOSIS — I1A0 Resistant hypertension: Secondary | ICD-10-CM | POA: Insufficient documentation

## 2023-10-15 DIAGNOSIS — M7581 Other shoulder lesions, right shoulder: Secondary | ICD-10-CM

## 2023-10-15 DIAGNOSIS — G43909 Migraine, unspecified, not intractable, without status migrainosus: Secondary | ICD-10-CM | POA: Diagnosis not present

## 2023-10-15 DIAGNOSIS — Z6838 Body mass index (BMI) 38.0-38.9, adult: Secondary | ICD-10-CM | POA: Diagnosis not present

## 2023-10-15 DIAGNOSIS — Z87891 Personal history of nicotine dependence: Secondary | ICD-10-CM | POA: Insufficient documentation

## 2023-10-15 DIAGNOSIS — Z471 Aftercare following joint replacement surgery: Secondary | ICD-10-CM | POA: Diagnosis not present

## 2023-10-15 DIAGNOSIS — Z96612 Presence of left artificial shoulder joint: Secondary | ICD-10-CM | POA: Diagnosis not present

## 2023-10-15 HISTORY — PX: REVERSE SHOULDER ARTHROPLASTY: SHX5054

## 2023-10-15 LAB — GLUCOSE, CAPILLARY: Glucose-Capillary: 122 mg/dL — ABNORMAL HIGH (ref 70–99)

## 2023-10-15 SURGERY — REVERSE SHOULDER ARTHROPLASTY
Anesthesia: General | Site: Shoulder | Laterality: Left

## 2023-10-15 MED ORDER — MIDAZOLAM HCL 2 MG/2ML IJ SOLN
INTRAMUSCULAR | Status: AC
Start: 2023-10-15 — End: ?
  Filled 2023-10-15: qty 2

## 2023-10-15 MED ORDER — ONDANSETRON HCL 4 MG/2ML IJ SOLN
INTRAMUSCULAR | Status: DC | PRN
  Administered 2023-10-15: 4 mg via INTRAVENOUS

## 2023-10-15 MED ORDER — LABETALOL HCL 5 MG/ML IV SOLN
INTRAVENOUS | Status: AC
  Filled 2023-10-15: qty 4

## 2023-10-15 MED ORDER — ACETAMINOPHEN 500 MG PO TABS: 500.0000 mg | ORAL_TABLET | Freq: Three times a day (TID) | ORAL | 0 refills | Status: AC

## 2023-10-15 MED ORDER — ACETAMINOPHEN 500 MG PO TABS
1000.0000 mg | ORAL_TABLET | Freq: Once | ORAL | Status: AC
  Administered 2023-10-15: 1000 mg via ORAL
  Filled 2023-10-15: qty 2

## 2023-10-15 MED ORDER — PHENYLEPHRINE 80 MCG/ML (10ML) SYRINGE FOR IV PUSH (FOR BLOOD PRESSURE SUPPORT)
PREFILLED_SYRINGE | INTRAVENOUS | Status: AC
  Filled 2023-10-15: qty 10

## 2023-10-15 MED ORDER — VANCOMYCIN HCL 1000 MG IV SOLR
INTRAVENOUS | Status: AC
Start: 2023-10-15 — End: ?
  Filled 2023-10-15: qty 20

## 2023-10-15 MED ORDER — DEXAMETHASONE SODIUM PHOSPHATE 10 MG/ML IJ SOLN
INTRAMUSCULAR | Status: DC | PRN
  Administered 2023-10-15: 8 mg via INTRAVENOUS

## 2023-10-15 MED ORDER — LABETALOL HCL 5 MG/ML IV SOLN
5.0000 mg | INTRAVENOUS | Status: DC | PRN
  Administered 2023-10-15: 5 mg via INTRAVENOUS

## 2023-10-15 MED ORDER — HYDROMORPHONE HCL 1 MG/ML IJ SOLN: 0.2500 mg | INTRAMUSCULAR | Status: DC | PRN

## 2023-10-15 MED ORDER — FENTANYL CITRATE (PF) 100 MCG/2ML IJ SOLN
INTRAMUSCULAR | Status: AC
  Administered 2023-10-15: 50 ug via INTRAVENOUS
  Filled 2023-10-15: qty 2

## 2023-10-15 MED ORDER — CEFAZOLIN SODIUM-DEXTROSE 2-4 GM/100ML-% IV SOLN
2.0000 g | INTRAVENOUS | Status: AC
Start: 2023-10-15 — End: 2023-10-15
  Administered 2023-10-15: 2 g via INTRAVENOUS
  Filled 2023-10-15: qty 100

## 2023-10-15 MED ORDER — TRANEXAMIC ACID-NACL 1000-0.7 MG/100ML-% IV SOLN
1000.0000 mg | INTRAVENOUS | Status: AC
  Administered 2023-10-15: 1000 mg via INTRAVENOUS
  Filled 2023-10-15: qty 100

## 2023-10-15 MED ORDER — PHENYLEPHRINE HCL-NACL 20-0.9 MG/250ML-% IV SOLN
INTRAVENOUS | Status: DC | PRN
  Administered 2023-10-15: 25 ug/min via INTRAVENOUS

## 2023-10-15 MED ORDER — ROCURONIUM BROMIDE 10 MG/ML (PF) SYRINGE
PREFILLED_SYRINGE | INTRAVENOUS | Status: DC | PRN
  Administered 2023-10-15: 20 mg via INTRAVENOUS
  Administered 2023-10-15: 60 mg via INTRAVENOUS

## 2023-10-15 MED ORDER — IRRISEPT - 450ML BOTTLE WITH 0.05% CHG IN STERILE WATER, USP 99.95% OPTIME
TOPICAL | Status: DC | PRN
  Administered 2023-10-15: 450 mL via TOPICAL

## 2023-10-15 MED ORDER — 0.9 % SODIUM CHLORIDE (POUR BTL) OPTIME
TOPICAL | Status: DC | PRN
  Administered 2023-10-15: 1000 mL

## 2023-10-15 MED ORDER — FENTANYL CITRATE (PF) 250 MCG/5ML IJ SOLN
INTRAMUSCULAR | Status: AC
  Filled 2023-10-15: qty 5

## 2023-10-15 MED ORDER — EPHEDRINE 5 MG/ML INJ
INTRAVENOUS | Status: AC
  Filled 2023-10-15: qty 5

## 2023-10-15 MED ORDER — OXYCODONE HCL 5 MG PO TABS
5.0000 mg | ORAL_TABLET | Freq: Once | ORAL | Status: AC
  Administered 2023-10-15: 5 mg via ORAL

## 2023-10-15 MED ORDER — BUPIVACAINE-EPINEPHRINE (PF) 0.5% -1:200000 IJ SOLN
INTRAMUSCULAR | Status: DC | PRN
  Administered 2023-10-15: 15 mL via PERINEURAL

## 2023-10-15 MED ORDER — PROPOFOL 10 MG/ML IV BOLUS
INTRAVENOUS | Status: DC | PRN
  Administered 2023-10-15: 100 mg via INTRAVENOUS

## 2023-10-15 MED ORDER — LIDOCAINE 2% (20 MG/ML) 5 ML SYRINGE
INTRAMUSCULAR | Status: AC
  Filled 2023-10-15: qty 5

## 2023-10-15 MED ORDER — FENTANYL CITRATE (PF) 250 MCG/5ML IJ SOLN
INTRAMUSCULAR | Status: DC | PRN
  Administered 2023-10-15: 100 ug via INTRAVENOUS

## 2023-10-15 MED ORDER — PROPOFOL 10 MG/ML IV BOLUS
INTRAVENOUS | Status: AC
  Filled 2023-10-15: qty 20

## 2023-10-15 MED ORDER — MIDAZOLAM HCL 2 MG/2ML IJ SOLN: 1.0000 mg | Freq: Once | INTRAMUSCULAR | Status: AC

## 2023-10-15 MED ORDER — OXYCODONE HCL 5 MG PO TABS
5.0000 mg | ORAL_TABLET | ORAL | 0 refills | Status: DC | PRN
Start: 2023-10-15 — End: 2024-04-03

## 2023-10-15 MED ORDER — LACTATED RINGERS IV SOLN
INTRAVENOUS | Status: DC
Start: 2023-10-15 — End: 2023-10-16

## 2023-10-15 MED ORDER — BUPIVACAINE LIPOSOME 1.3 % IJ SUSP
INTRAMUSCULAR | Status: DC | PRN
  Administered 2023-10-15: 10 mL via PERINEURAL

## 2023-10-15 MED ORDER — GABAPENTIN 300 MG PO CAPS
300.0000 mg | ORAL_CAPSULE | Freq: Once | ORAL | Status: AC
  Administered 2023-10-15: 300 mg via ORAL
  Filled 2023-10-15: qty 1

## 2023-10-15 MED ORDER — FENTANYL CITRATE (PF) 100 MCG/2ML IJ SOLN: 50.0000 ug | Freq: Once | INTRAMUSCULAR | Status: AC

## 2023-10-15 MED ORDER — ALBUTEROL SULFATE HFA 108 (90 BASE) MCG/ACT IN AERS
INHALATION_SPRAY | RESPIRATORY_TRACT | Status: DC | PRN
Start: 2023-10-15 — End: 2023-10-15
  Administered 2023-10-15: 6 via RESPIRATORY_TRACT

## 2023-10-15 MED ORDER — DEXAMETHASONE SODIUM PHOSPHATE 10 MG/ML IJ SOLN
INTRAMUSCULAR | Status: AC
  Filled 2023-10-15: qty 1

## 2023-10-15 MED ORDER — LIDOCAINE 2% (20 MG/ML) 5 ML SYRINGE
INTRAMUSCULAR | Status: DC | PRN
  Administered 2023-10-15: 20 mg via INTRAVENOUS

## 2023-10-15 MED ORDER — MIDAZOLAM HCL 2 MG/2ML IJ SOLN: 0.5000 mg | Freq: Once | INTRAMUSCULAR | Status: DC | PRN

## 2023-10-15 MED ORDER — CHLORHEXIDINE GLUCONATE 0.12 % MT SOLN
15.0000 mL | Freq: Once | OROMUCOSAL | Status: AC
  Administered 2023-10-15: 15 mL via OROMUCOSAL
  Filled 2023-10-15: qty 15

## 2023-10-15 MED ORDER — ONDANSETRON HCL 4 MG/2ML IJ SOLN
INTRAMUSCULAR | Status: AC
Start: 2023-10-15 — End: ?
  Filled 2023-10-15: qty 2

## 2023-10-15 MED ORDER — ORAL CARE MOUTH RINSE: 15.0000 mL | Freq: Once | OROMUCOSAL | Status: AC

## 2023-10-15 MED ORDER — SUGAMMADEX SODIUM 200 MG/2ML IV SOLN
INTRAVENOUS | Status: DC | PRN
  Administered 2023-10-15: 200 mg via INTRAVENOUS
  Administered 2023-10-15: 100 mg via INTRAVENOUS

## 2023-10-15 MED ORDER — MIDAZOLAM HCL 2 MG/2ML IJ SOLN
INTRAMUSCULAR | Status: AC
  Administered 2023-10-15: 1 mg via INTRAVENOUS
  Filled 2023-10-15: qty 2

## 2023-10-15 MED ORDER — ACETAMINOPHEN 500 MG PO TABS: 1000.0000 mg | ORAL_TABLET | Freq: Once | ORAL | Status: DC

## 2023-10-15 MED ORDER — SODIUM CHLORIDE 0.9 % IR SOLN
Status: DC | PRN
  Administered 2023-10-15: 1000 mL

## 2023-10-15 MED ORDER — ROCURONIUM BROMIDE 10 MG/ML (PF) SYRINGE
PREFILLED_SYRINGE | INTRAVENOUS | Status: AC
  Filled 2023-10-15: qty 20

## 2023-10-15 MED ORDER — VANCOMYCIN HCL 1000 MG IV SOLR
INTRAVENOUS | Status: DC | PRN
  Administered 2023-10-15: 1000 mg

## 2023-10-15 MED ORDER — OXYCODONE HCL 5 MG PO TABS
ORAL_TABLET | ORAL | Status: AC
  Filled 2023-10-15: qty 1

## 2023-10-15 MED ORDER — POVIDONE-IODINE 10 % EX SOLN
CUTANEOUS | Status: DC | PRN
  Administered 2023-10-15: 1 via TOPICAL

## 2023-10-15 SURGICAL SUPPLY — 62 items
ANCHOR JUGGERKNOT SOFT 1.5 (Anchor) ×1 IMPLANT
AUG BASEPLATE 15DEG 25 WEDGE (Joint) ×1 IMPLANT
BAG COUNTER SPONGE SURGICOUNT (BAG) ×1
BIT DRILL 3.2 PERIPHERAL SCREW (BIT) ×1
BLADE SAW SAG 29X58X.64 (BLADE)
BLADE SAW SGTL 73X25 THK (BLADE) ×1
CHLORAPREP W/TINT 26 (MISCELLANEOUS) ×2
COOLER ICEMAN CLASSIC (MISCELLANEOUS) ×1
COVER SURGICAL LIGHT HANDLE (MISCELLANEOUS) ×1
DRAPE IMP U-DRAPE 54X76 (DRAPES) ×1
DRAPE INCISE IOBAN 66X45 STRL (DRAPES) ×1
DRAPE U-SHAPE 47X51 STRL (DRAPES) ×2
DRSG AQUACEL AG ADV 3.5X10 (GAUZE/BANDAGES/DRESSINGS) ×1
ELECT BLADE 4.0 EZ CLEAN MEGAD (MISCELLANEOUS) ×1
ELECT REM PT RETURN 9FT ADLT (ELECTROSURGICAL) ×1
GAUZE XEROFORM 1X8 LF (GAUZE/BANDAGES/DRESSINGS) ×1
GLENOSPHERE REV SHOULDER 36 (Joint) ×1 IMPLANT
GLOVE BIO SURGEON STRL SZ7.5 (GLOVE) ×3
GLOVE BIOGEL PI IND STRL 6.5 (GLOVE) ×1
GLOVE BIOGEL PI IND STRL 8 (GLOVE) ×2
GLOVE ECLIPSE 6.0 STRL STRAW (GLOVE) ×1
GLOVE INDICATOR 8.0 STRL GRN (GLOVE) ×1
GOWN STRL REUS W/ TWL LRG LVL3 (GOWN DISPOSABLE) ×2
GUIDE PIN 3X75 SHOULDER (PIN) ×2
GUIDEWIRE GLENOID 2.5X220 (WIRE) ×1
INSERT REVERSED HUMERAL SIZE 1 (Orthopedic Implant) ×1 IMPLANT
KIT BASIN OR (CUSTOM PROCEDURE TRAY) ×1
KIT STABILIZATION SHOULDER (MISCELLANEOUS) ×1
KIT TURNOVER KIT B (KITS) ×1
MANIFOLD NEPTUNE II (INSTRUMENTS) ×1
NDL HYPO 21X1 ECLIPSE (NEEDLE) ×1 IMPLANT
NDL MAYO TROCAR (NEEDLE) ×1 IMPLANT
NEEDLE HYPO 21X1 ECLIPSE (NEEDLE) ×1
NEEDLE MAYO TROCAR (NEEDLE) ×1
NS IRRIG 1000ML POUR BTL (IV SOLUTION) ×1
PACK SHOULDER (CUSTOM PROCEDURE TRAY) ×1
PACK UNIVERSAL I (CUSTOM PROCEDURE TRAY) ×1
PAD ARMBOARD 7.5X6 YLW CONV (MISCELLANEOUS) ×2
PAD COLD SHLDR WRAP-ON (PAD) ×1
RESTRAINT HEAD UNIVERSAL NS (MISCELLANEOUS) ×1
SCREW 5.0X18 (Screw) ×1 IMPLANT
SCREW 5.5X26 (Screw) ×1 IMPLANT
SCREW BONE INTRNL SM 7 (Screw) ×1 IMPLANT
SCREW PERIPHERAL 30 (Screw) ×1 IMPLANT
SCREW PERIPHERAL 5.0X34 (Screw) ×1 IMPLANT
SET HNDPC FAN SPRY TIP SCT (DISPOSABLE) ×1
SLEEVE SCD COMPRESS KNEE MED (STOCKING) ×1
SLING ARM IMMOBILIZER LRG (SOFTGOODS) ×1
SPONGE T-LAP 18X18 ~~LOC~~+RFID (SPONGE) ×1
STAPLER VISISTAT 35W (STAPLE)
STEM HUM PLUS SHORT SZ1+ (Stem) ×1 IMPLANT
SUCTION TUBE FRAZIER 10FR DISP (SUCTIONS) ×1
SUT ETHILON 3 0 PS 1 (SUTURE) ×1
SUT FIBERWIRE #5 38 CONV NDL (SUTURE) ×2
SUT VIC AB 0 CT1 27XBRD ANBCTR (SUTURE) ×2
SUT VIC AB 2-0 CT1 TAPERPNT 27 (SUTURE) ×3
SYR 50ML LL SCALE MARK (SYRINGE) ×1
TAPE LABRALWHITE 1.5X36 (TAPE)
TAPE SUT LABRALTAP WHT/BLK (SUTURE)
TOWEL GREEN STERILE (TOWEL DISPOSABLE) ×1
TRAY FOLEY MTR SLVR 16FR STAT (SET/KITS/TRAYS/PACK) ×1
WATER STERILE IRR 1000ML POUR (IV SOLUTION) ×1

## 2023-10-15 NOTE — Discharge Instructions (Signed)
 Discharge Instructions    Attending Surgeon: Huel Cote, MD Office Phone Number: [phone]   Diagnosis and Procedures:    Surgeries Performed: Left shoulder reverse shoulder arthroplasty  Discharge Plan:    Diet: Resume usual diet. Begin with light or bland foods.  Drink plenty of fluids.  Activity:  Keep sling and dressing in place until your follow up visit in Physical Therapy You are advised to go home directly from the hospital or surgical center. Restrict your activities.  GENERAL INSTRUCTIONS: 1.  Keep your surgical site elevated above your heart for at least 5-7 days or longer to prevent swelling. This will improve your comfort and your overall recovery following surgery.     2. Please call Dr. Serena Croissant office at [phone] with questions Monday-Friday during business hours. If no one answers, please leave a message and someone should get back to the patient within 24 hours. For emergencies please call 911 or proceed to the emergency room.   3. Patient to notify surgical team if experiences any of the following: Bowel/Bladder dysfunction, uncontrolled pain, nerve/muscle weakness, incision with increased drainage or redness, nausea/vomiting and Fever greater than 101.0 F.  Be alert for signs of infection including redness, streaking, odor, fever or chills. Be alert for excessive pain or bleeding and notify your surgeon immediately.  WOUND INSTRUCTIONS:   Leave your dressing/cast/splint in place until your post operative visit.  Keep it clean and dry.  Always keep the incision clean and dry until the staples/sutures are removed. If there is no drainage from the incision you should keep it open to air. If there is drainage from the incision you must keep it covered at all times until the drainage stops  Do not soak in a bath tub, hot tub, pool, lake or other body of water until 21 days after your surgery and your incision is completely dry and healed.  If you  have removable sutures (or staples) they must be removed 10-14 days (unless otherwise instructed) from the day of your surgery.     1)  Elevate the extremity as much as possible.  2)  Keep the dressing clean and dry.  3)  Please call us if the dressing becomes wet or dirty.  4)  If you are experiencing worsening pain or worsening swelling, please call.     MEDICATIONS: Resume all previous home medications at the previous prescribed dose and frequency unless otherwise noted Start taking the  pain medications on an as-needed basis as prescribed  Please taper down pain medication over the next week following surgery.  Ideally you should not require a refill of any narcotic pain medication.  Take pain medication with food to minimize nausea. In addition to the prescribed pain medication, you may take over-the-counter pain relievers such as Tylenol.  Do NOT take additional tylenol if your pain medication already has tylenol in it.  Aspirin 325mg  daily per bottle instructions. Narcotic Policy: Per [facility] clinic policy, our goal is ensure optimal postoperative pain control with a multimodal pain management strategy. For all [facility] patients, our goal is to wean post-operative narcotic medications by 6 weeks post-operatively, and many times sooner. If this is not possible due to utilization of pain medication prior to surgery, your [facility] doctor will support your acute post-operative pain control for the first 6 weeks postoperatively, with a plan to transition you back to your primary pain team following that. Cyndia Skeeters will work to ensure a Therapist, occupational.  FOLLOWUP INSTRUCTIONS: 1. Follow up at the Physical Therapy Clinic 3-4 days following surgery. This appointment should be scheduled unless other arrangements have been made.The Physical Therapy scheduling number is [phone] if an appointment has not already been arranged.  2. Contact Dr. Serena Croissant office during office hours at  [phone] or the practice after hours line at [phone] for non-emergencies. For medical emergencies call 911.   Discharge Location: Home

## 2023-10-15 NOTE — Interval H&P Note (Signed)
History and Physical Interval Note:  10/15/2023 12:12 PM  Cynthia [name]  has presented today for surgery, with the diagnosis of LEFT ROTATOR CUFF ARTHROPATHY.  The various methods of treatment have been discussed with the patient and family. After consideration of risks, benefits and other options for treatment, the patient has consented to  Procedure(s): LEFT REVERSE SHOULDER ARTHROPLASTY (Left) as a surgical intervention.  The patient's history has been reviewed, patient examined, no change in status, stable for surgery.  I have reviewed the patient's chart and labs.  Questions were answered to the patient's satisfaction.     Huel Cote

## 2023-10-15 NOTE — Anesthesia Procedure Notes (Signed)
Anesthesia Regional Block: Interscalene brachial plexus block   Pre-Anesthetic Checklist: , timeout performed,  Correct Patient, Correct Site, Correct Laterality,  Correct Procedure, Correct Position, site marked,  Risks and benefits discussed,  Surgical consent,  Pre-op evaluation,  At surgeon's request and post-op pain management  Laterality: Left and Upper  Prep: chloraprep       Needles:  Injection technique: Single-shot  Needle Type: Echogenic Needle     Needle Length: 9cm  Needle Gauge: 21     Additional Needles:   Procedures:,,,, ultrasound used (permanent image in chart),,    Narrative:  Start time: 10/15/2023 1:40 PM End time: 10/15/2023 1:46 PM Injection made incrementally with aspirations every 5 mL.  Performed by: Personally  Anesthesiologist: Jairo Ben, MD  Additional Notes: Pt identified in Holding room.  Monitors applied. Working IV access confirmed. Timeout, Sterile prep L clavicle and neck.  #21ga ECHOgenic Arrow block needle to interscalene brachial plexus with US guidance.  15cc 0.5% Bupivacaine 1:200k epi, Exparel injected incrementally after negative test dose.  Patient asymptomatic, VSS, no heme aspirated, tolerated well.   Sandford Craze, MD

## 2023-10-15 NOTE — Anesthesia Procedure Notes (Addendum)
Procedure Name: Intubation Date/Time: 10/15/2023 2:12 PM  Performed by: Shary Decamp, CRNAPre-anesthesia Checklist: Patient identified, Patient being monitored, Timeout performed, Emergency Drugs available and Suction available Patient Re-evaluated:Patient Re-evaluated prior to induction Oxygen Delivery Method: Circle System Utilized Preoxygenation: Pre-oxygenation with 100% oxygen Induction Type: IV induction Ventilation: Mask ventilation without difficulty Laryngoscope Size: Miller and 2 Grade View: Grade III Tube type: Oral Tube size: 7.0 mm Number of attempts: 1 Airway Equipment and Method: Stylet Placement Confirmation: ETT inserted through vocal cords under direct vision, positive ETCO2 and breath sounds checked- equal and bilateral Secured at: 21 cm Tube secured with: Tape Dental Injury: Teeth and Oropharynx as per pre-operative assessment

## 2023-10-15 NOTE — Transfer of Care (Signed)
Immediate Anesthesia Transfer of Care Note  Patient: Cynthia [name]  Procedure(s) Performed: LEFT REVERSE SHOULDER ARTHROPLASTY (Left: Shoulder)  Patient Location: PACU  Anesthesia Type:GA combined with regional for post-op pain  Level of Consciousness: awake and alert   Airway & Oxygen Therapy: Patient Spontanous Breathing and Patient connected to face mask oxygen  Post-op Assessment: Report given to RN and Post -op Vital signs reviewed and stable  Post vital signs: Reviewed and stable  Last Vitals:  Vitals Value Taken Time  BP 174/106 10/15/23 1613  Temp    Pulse 65 10/15/23 1615  Resp 18 10/15/23 1615  SpO2 97 % 10/15/23 1615  Vitals shown include unfiled device data.  Last Pain:  Vitals:   10/15/23 1212  TempSrc: Oral  PainSc: 5       Patients Stated Pain Goal: 3 (10/15/23 1212)  Complications: No notable events documented.
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
## 2023-10-15 NOTE — Brief Op Note (Signed)
   Brief Op Note  Date of Surgery: 10/15/2023  Preoperative Diagnosis: LEFT ROTATOR CUFF ARTHROPATHY  Postoperative Diagnosis: same  Procedure: Procedure(s): LEFT REVERSE SHOULDER ARTHROPLASTY  Implants: Implant Name Type Inv. Item Serial No. Manufacturer Lot No. LRB No. Used Action  SCREW BONE INTRNL SM 7 - [id number] Screw SCREW BONE INTRNL SM 7 [id number] [employer]  Left 1 Implanted  AUG BASEPLATE 15DEG 25 WEDGE - [id number] Joint AUG BASEPLATE 15DEG 25 WEDGE [id number] [employer]  Left 1 Implanted  SCREW PERIPHERAL 5.0X34 - [id number] Screw SCREW PERIPHERAL 5.0X34  [employer]  Left 1 Implanted  SCREW PERIPHERAL 30 - [id number] Screw SCREW PERIPHERAL 30  [employer]  Left 1 Implanted  SCREW 5.5X26 - [id number] Screw SCREW 5.5X26  [employer]  Left 1 Implanted  SCREW 5.0X18 - [id number] Screw SCREW 5.0X18  [employer]  Left 1 Implanted  GLENOSPHERE REV SHOULDER 36 - [id number] Joint GLENOSPHERE REV SHOULDER 36 [id number] [employer]  Left 1 Implanted  STEM HUM PLUS SHORT SZ1+ - [id number] Stem STEM HUM PLUS SHORT SZ1+ [id number] [employer]  Left 1 Implanted  INSERT REVERSED HUMERAL SIZE 1 - [id number] Orthopedic Implant INSERT REVERSED HUMERAL SIZE 1 [id number] [employer]  Left 1 Implanted  ANCHOR JUGGERKNOT SOFT 1.5 - [id number] Anchor ANCHOR JUGGERKNOT SOFT 1.5  ZIMMER RECON(ORTH,TRAU,BIO,SG) [id number] Left 1 Implanted    Surgeons: Surgeon(s): Huel Cote, MD  Anesthesia: General    Estimated Blood Loss: See anesthesia record  Complications: None  Condition to PACU: Stable  Benancio Deeds, MD 10/15/2023 3:25 PM

## 2023-10-15 NOTE — Anesthesia Postprocedure Evaluation (Signed)
Anesthesia Post Note  Patient: Cynthia [name]  Procedure(s) Performed: LEFT REVERSE SHOULDER ARTHROPLASTY (Left: Shoulder)     Patient location during evaluation: PACU Anesthesia Type: General Level of consciousness: awake and alert, patient cooperative and oriented Pain management: pain level controlled Vital Signs Assessment: post-procedure vital signs reviewed and stable Respiratory status: spontaneous breathing, nonlabored ventilation and respiratory function stable Cardiovascular status: blood pressure returned to baseline and stable Postop Assessment: no apparent nausea or vomiting, adequate PO intake and able to ambulate Anesthetic complications: no   No notable events documented.  Last Vitals:  Vitals:   10/15/23 1645 10/15/23 1700  BP: (!) 159/95 (!) 156/87  Pulse: 63 64  Resp: 12 17  Temp:  36.6 C  SpO2: 94% 94%    Last Pain:  Vitals:   10/15/23 1612  TempSrc:   PainSc: 0-No pain                 Jolane Bankhead,E. Donita Newland

## 2023-10-15 NOTE — Op Note (Signed)
Date of Surgery: 10/15/2023  INDICATIONS: Ms. Doland is a 67 y.o.-year-old female with left shoulder rotator cuff arthropathy.  The risk and benefits of the procedure were discussed in detail and documented in the pre-operative evaluation.   PREOPERATIVE DIAGNOSIS: 1. Left shoulder rotator cuff arthropathy  POSTOPERATIVE DIAGNOSIS: Same.  PROCEDURE: 1. Left shoulder reverse shoulder arthroplasty 2. Left shoulder biceps tenodesis  SURGEON: Benancio Deeds MD  ASSISTANT: Kerby Less, ATC  ANESTHESIA:  general plus interscalene nerve block  IV FLUIDS AND URINE: See anesthesia record.  ANTIBIOTICS: Ancef  ESTIMATED BLOOD LOSS: 50 mL.  IMPLANTS:  Implant Name Type Inv. Item Serial No. Manufacturer Lot No. LRB No. Used Action  SCREW BONE INTRNL SM 7 - [id number] Screw SCREW BONE INTRNL SM 7 [id number] [employer]  Left 1 Implanted  AUG BASEPLATE 15DEG 25 WEDGE - [id number] Joint AUG BASEPLATE 15DEG 25 WEDGE [id number] [employer]  Left 1 Implanted  SCREW PERIPHERAL 5.0X34 - [id number] Screw SCREW PERIPHERAL 5.0X34  [employer]  Left 1 Implanted  SCREW PERIPHERAL 30 - [id number] Screw SCREW PERIPHERAL 30  [employer]  Left 1 Implanted  SCREW 5.5X26 - [id number] Screw SCREW 5.5X26  [employer]  Left 1 Implanted  SCREW 5.0X18 - [id number] Screw SCREW 5.0X18  [employer]  Left 1 Implanted  GLENOSPHERE REV SHOULDER 36 - [id number] Joint GLENOSPHERE REV SHOULDER 36 [id number] [employer]  Left 1 Implanted  STEM HUM PLUS SHORT SZ1+ - [id number] Stem STEM HUM PLUS SHORT SZ1+ [id number] [employer]  Left 1 Implanted  INSERT REVERSED HUMERAL SIZE 1 - [id number] Orthopedic Implant INSERT REVERSED HUMERAL SIZE 1 [id number] [employer]  Left 1 Implanted  ANCHOR JUGGERKNOT SOFT 1.5 - [id number] Anchor ANCHOR JUGGERKNOT SOFT 1.5  ZIMMER RECON(ORTH,TRAU,BIO,SG) [id number] Left 1 Implanted    DRAINS: None  CULTURES: None  COMPLICATIONS: none  DESCRIPTION OF PROCEDURE:   Patient was identified  in the preoperative holding area.  Anesthesia performed an interscalene nerve block after universal timeout was performed with nursing.  Ancef was given 1 hour prior to skin incision.    The surgical site was scrubbed with a chlorhexidine scrub brush and alcohol.  The patient was then prepped with chlorhexidine skin prep.  The patient was subsequently taken back to the operating room.  Anesthesia was induced. The patient was transferred to the beachchair position.  All bony prominences were padded.  Final timeout was again performed.     The bony landmarks of the shoulder were marked with a marking pen. A delto-pectoral incision was made, extending up approximately 5 inches. The wound with then irrigated with dilute betadine. Cephalic vein was identified, and an protected. This was retracted medially. Subdeltoid and subpectoral lesions were released. Neurovascular structures were carefully protected. The Gelpi retractor was used to retract the deltoid and pectoralis major.    The deltoid was retracted laterally with a Brown humeral retractor.  The conjoined tendon was identified. The cleido-pectoral fascia was excised.  The axillary nerve was palpated and carefully protected throughout the procedure. The biceps tendon was found and tenodesed to the upper pec with # 2 Ethibond non-absorbable suture.  Proximally the biceps tendon was removed up to the joint.  The bicipital groove was used for a landmark to establish rotator cuff interval. The subscap was tagged with a #2 Ethibond.  At this point the subscapularis was peeled off from the lesser tuberosity with care to avoid dissection distally in order to protect the axillary nerve.  Once the joint was  exposed the proximal humerus was delivered with external rotation and extension of the arm. The humerus was prepped initially by performing a humeral neck cut. This was done with the guide using 30 degrees of retroversion as a reference.  The head portion was  removed.  A medullary sounding reamer was then used.  We subsequently placed our guidewire through the center of the humeral head using the reference guide.  This was a size 1.  Metaphyseal reamer was then used.  Finally the size 1 broach was malleted into place with excellent purchase.  A tonsil clamp was used to attempt to pull this out with very good purchase   Attention was then turned to the glenoid.  Posteriorly a large Darach retractor was used.  A 360 Degree release of the subscapularis and glenoid were done. The capsule was released from the humerus.    Glenoid retractors were placed posteriorly, superiorly behind the biceps tendon and anteriorly on the glenoid neck. A 360-degree release of the capsule was performed with cautery.  The triceps was released off the inferior tubercle of the glenoid. The axillary nerve was carefully protected with the surgeon's index finger, retracting it and using cautery.   A guidepin was placed through the glenoid guide. The guidepin was drilled until it exited the cortex. The guidepin was over drilled. Next, the glenoid was prepared with the reamer down to cortical bone.  The central peg hole was totally within the scapular neck tested with the probe.  The baseplate was then placed screwed securely with good purchase in position and then secured with 4 screws. In each case, they were drilled and measured and the appropriate length screw placed with excellent rigid fixation of the baseplate. The glenosphere was placed with size based based on pre-operative templating.   The humerus was then delivered and a neutral polyethylene trial was placed.  This was brought to just the level of the reduction but not completely reduced.  A 0 final poly was selected and impacted.    Appropriate tension was noted on the conjoined tendon and deltoid muscle.  Extension was stable, external and internal rotation as well.  The subscap was pulled over but as this was not able to reach  comfortably decision was made not to repair in order to prevent limited in external rotation.  The wound was then irrigated. Vancomycin powder was placed in the wound again for infection prevention.   The wound was then closed in layers with 0 Vicryl interrupted in the deep subcu followed by 2-0 Vicryl in the superficial subcu and 3-0 nylon for skin.  An Aquacel dressing was applied as well as an Veterinary surgeon.  A shoulder immobilizer was applied.     Postoperative Plan: -The patient will begin the reverse shoulder rehab protocol  -Aspirin 325 mg daily will be used for 4 weeks for blood clot prevention -I will see the patient back in 2 weeks for first postoperative wound check     Benancio Deeds, MD 3:25 PM

## 2023-10-16 ENCOUNTER — Ambulatory Visit (HOSPITAL_BASED_OUTPATIENT_CLINIC_OR_DEPARTMENT_OTHER): Payer: PPO | Admitting: Student

## 2023-10-16 ENCOUNTER — Encounter (HOSPITAL_COMMUNITY): Payer: Self-pay | Admitting: Orthopaedic Surgery

## 2023-10-16 DIAGNOSIS — M12812 Other specific arthropathies, not elsewhere classified, left shoulder: Secondary | ICD-10-CM
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
## 2023-10-17 ENCOUNTER — Encounter (HOSPITAL_BASED_OUTPATIENT_CLINIC_OR_DEPARTMENT_OTHER): Payer: Self-pay | Admitting: Orthopaedic Surgery

## 2023-10-18 ENCOUNTER — Other Ambulatory Visit: Payer: Self-pay | Admitting: Orthopaedic Surgery

## 2023-10-18 ENCOUNTER — Telehealth (HOSPITAL_BASED_OUTPATIENT_CLINIC_OR_DEPARTMENT_OTHER): Payer: Self-pay | Admitting: Orthopaedic Surgery

## 2023-10-18 MED ORDER — HYDROCODONE-ACETAMINOPHEN 5-325 MG PO TABS: 1.0000 | ORAL_TABLET | Freq: Four times a day (QID) | ORAL | 0 refills | Status: DC | PRN

## 2023-10-18 NOTE — Telephone Encounter (Signed)
Patient states that pain she is having pain in had and her forarm not her shoulder that she had surgery on. The oxycodone is not helping her at all. Please contact patient [phone] she states that she cannot  get in her mychart

## 2023-10-18 NOTE — Telephone Encounter (Signed)
Spoke to pt informing hydrocodone has been sent and she can take it every 6 hours. I also told her she can ice her forearm as well since it is likely bruised from manipulating during surgery.

## 2023-10-22 ENCOUNTER — Ambulatory Visit: Payer: PPO

## 2023-10-22 ENCOUNTER — Other Ambulatory Visit: Payer: Self-pay

## 2023-10-22 ENCOUNTER — Ambulatory Visit: Payer: PPO | Attending: Family Medicine

## 2023-10-22 DIAGNOSIS — M6281 Muscle weakness (generalized): Secondary | ICD-10-CM | POA: Insufficient documentation

## 2023-10-22 DIAGNOSIS — M25512 Pain in left shoulder: Secondary | ICD-10-CM | POA: Insufficient documentation

## 2023-10-22 DIAGNOSIS — R293 Abnormal posture: Secondary | ICD-10-CM | POA: Diagnosis present

## 2023-10-22 DIAGNOSIS — R600 Localized edema: Secondary | ICD-10-CM | POA: Diagnosis present
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
## 2023-10-23 ENCOUNTER — Encounter (HOSPITAL_BASED_OUTPATIENT_CLINIC_OR_DEPARTMENT_OTHER): Payer: Self-pay | Admitting: Orthopaedic Surgery

## 2023-10-23 ENCOUNTER — Other Ambulatory Visit: Payer: Self-pay | Admitting: Orthopaedic Surgery

## 2023-10-23 MED ORDER — ACETAMINOPHEN 500 MG PO TABS: 500.0000 mg | ORAL_TABLET | Freq: Three times a day (TID) | ORAL | 2 refills | Status: AC

## 2023-10-25 ENCOUNTER — Ambulatory Visit: Payer: PPO

## 2023-10-25 DIAGNOSIS — R600 Localized edema: Secondary | ICD-10-CM

## 2023-10-25 DIAGNOSIS — M25512 Pain in left shoulder: Secondary | ICD-10-CM

## 2023-10-25 DIAGNOSIS — M6281 Muscle weakness (generalized): Secondary | ICD-10-CM

## 2023-10-25 DIAGNOSIS — R293 Abnormal posture: Secondary | ICD-10-CM
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
## 2023-10-26 ENCOUNTER — Ambulatory Visit (INDEPENDENT_AMBULATORY_CARE_PROVIDER_SITE_OTHER): Payer: PPO

## 2023-10-26 ENCOUNTER — Ambulatory Visit (HOSPITAL_BASED_OUTPATIENT_CLINIC_OR_DEPARTMENT_OTHER): Payer: PPO | Admitting: Orthopaedic Surgery

## 2023-10-26 DIAGNOSIS — Z96612 Presence of left artificial shoulder joint: Secondary | ICD-10-CM | POA: Diagnosis not present

## 2023-10-26 DIAGNOSIS — M12812 Other specific arthropathies, not elsewhere classified, left shoulder: Secondary | ICD-10-CM | POA: Diagnosis not present

## 2023-10-26 DIAGNOSIS — R609 Edema, unspecified: Secondary | ICD-10-CM | POA: Diagnosis not present

## 2023-10-26 DIAGNOSIS — Z471 Aftercare following joint replacement surgery: Secondary | ICD-10-CM | POA: Diagnosis not present
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
## 2023-10-29 ENCOUNTER — Ambulatory Visit: Payer: PPO

## 2023-10-29 DIAGNOSIS — R600 Localized edema: Secondary | ICD-10-CM

## 2023-10-29 DIAGNOSIS — R293 Abnormal posture: Secondary | ICD-10-CM

## 2023-10-29 DIAGNOSIS — M25512 Pain in left shoulder: Secondary | ICD-10-CM | POA: Diagnosis not present

## 2023-10-29 DIAGNOSIS — M6281 Muscle weakness (generalized): Secondary | ICD-10-CM
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
## 2023-11-01 ENCOUNTER — Ambulatory Visit: Payer: PPO

## 2023-11-01 ENCOUNTER — Other Ambulatory Visit: Payer: Self-pay | Admitting: Orthopaedic Surgery

## 2023-11-01 ENCOUNTER — Encounter (HOSPITAL_BASED_OUTPATIENT_CLINIC_OR_DEPARTMENT_OTHER): Payer: Self-pay | Admitting: Orthopaedic Surgery

## 2023-11-01 MED ORDER — HYDROCODONE-ACETAMINOPHEN 5-325 MG PO TABS: 1.0000 | ORAL_TABLET | Freq: Four times a day (QID) | ORAL | 0 refills | Status: DC | PRN

## 2023-11-05 ENCOUNTER — Ambulatory Visit: Payer: PPO

## 2023-11-05 DIAGNOSIS — M25512 Pain in left shoulder: Secondary | ICD-10-CM | POA: Diagnosis not present

## 2023-11-05 DIAGNOSIS — M6281 Muscle weakness (generalized): Secondary | ICD-10-CM

## 2023-11-05 DIAGNOSIS — R600 Localized edema: Secondary | ICD-10-CM

## 2023-11-05 DIAGNOSIS — R293 Abnormal posture: Secondary | ICD-10-CM
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
## 2023-11-06 ENCOUNTER — Other Ambulatory Visit: Payer: Self-pay | Admitting: Family Medicine

## 2023-11-06 DIAGNOSIS — I152 Hypertension secondary to endocrine disorders: Secondary | ICD-10-CM

## 2023-11-08 ENCOUNTER — Ambulatory Visit: Payer: PPO

## 2023-11-08 DIAGNOSIS — M6281 Muscle weakness (generalized): Secondary | ICD-10-CM

## 2023-11-08 DIAGNOSIS — M25512 Pain in left shoulder: Secondary | ICD-10-CM

## 2023-11-08 DIAGNOSIS — R600 Localized edema: Secondary | ICD-10-CM
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
## 2023-11-12 ENCOUNTER — Ambulatory Visit: Payer: PPO

## 2023-11-12 DIAGNOSIS — M6281 Muscle weakness (generalized): Secondary | ICD-10-CM

## 2023-11-12 DIAGNOSIS — M25512 Pain in left shoulder: Secondary | ICD-10-CM

## 2023-11-12 DIAGNOSIS — R600 Localized edema: Secondary | ICD-10-CM
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
## 2023-11-13 ENCOUNTER — Encounter (HOSPITAL_BASED_OUTPATIENT_CLINIC_OR_DEPARTMENT_OTHER): Payer: Self-pay | Admitting: Orthopaedic Surgery

## 2023-11-15 ENCOUNTER — Ambulatory Visit: Payer: PPO

## 2023-11-15 ENCOUNTER — Encounter (HOSPITAL_BASED_OUTPATIENT_CLINIC_OR_DEPARTMENT_OTHER): Payer: Self-pay | Admitting: Orthopaedic Surgery

## 2023-11-20 ENCOUNTER — Encounter: Payer: Self-pay | Admitting: Internal Medicine

## 2023-11-21 ENCOUNTER — Ambulatory Visit: Payer: PPO

## 2023-11-26 ENCOUNTER — Other Ambulatory Visit (HOSPITAL_COMMUNITY): Payer: Self-pay

## 2023-11-26 ENCOUNTER — Telehealth: Payer: Self-pay

## 2023-11-26 NOTE — Telephone Encounter (Signed)
 Pharmacy Patient Advocate Encounter   Received notification from Pt Calls Messages that prior authorization for Dexcom G7 sensor is required/requested.   Insurance verification completed.   The patient is insured through [facility] [employer] .   Per test claim: PA required; PA submitted to above mentioned insurance via CoverMyMeds Key/confirmation #/EOC [id number] Status is pending

## 2023-11-26 NOTE — Telephone Encounter (Signed)
 PA needed for Dexcom G7.

## 2023-11-30 ENCOUNTER — Ambulatory Visit (HOSPITAL_BASED_OUTPATIENT_CLINIC_OR_DEPARTMENT_OTHER): Payer: PPO | Admitting: Orthopaedic Surgery

## 2023-11-30 DIAGNOSIS — M12812 Other specific arthropathies, not elsewhere classified, left shoulder: Secondary | ICD-10-CM
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
## 2023-12-03 ENCOUNTER — Ambulatory Visit: Payer: PPO

## 2023-12-04 NOTE — Telephone Encounter (Signed)
Pharmacy Patient Advocate Encounter  Received notification from [facility] [employer] [facility] that Prior Authorization for Dexcom G7 sensor has been DENIED.  Denied under part D, however APPROVED under part B   PA #/Case ID/Reference #: [id number]

## 2023-12-05 ENCOUNTER — Ambulatory Visit: Payer: PPO

## 2023-12-05 MED ORDER — DEXCOM G7 SENSOR MISC: 3 refills | Status: DC

## 2023-12-12 ENCOUNTER — Ambulatory Visit: Payer: PPO

## 2023-12-16 ENCOUNTER — Other Ambulatory Visit: Payer: Self-pay | Admitting: Nurse Practitioner

## 2023-12-16 DIAGNOSIS — M797 Fibromyalgia: Secondary | ICD-10-CM

## 2023-12-17 ENCOUNTER — Other Ambulatory Visit: Payer: Self-pay

## 2023-12-17 MED ORDER — FREESTYLE LIBRE 3 SENSOR MISC: 3 refills | Status: DC

## 2024-01-11 ENCOUNTER — Encounter (HOSPITAL_BASED_OUTPATIENT_CLINIC_OR_DEPARTMENT_OTHER): Payer: PPO | Admitting: Orthopaedic Surgery

## 2024-01-28 ENCOUNTER — Other Ambulatory Visit: Payer: Self-pay | Admitting: Internal Medicine

## 2024-01-28 DIAGNOSIS — Z794 Long term (current) use of insulin: Secondary | ICD-10-CM

## 2024-03-14 ENCOUNTER — Encounter (HOSPITAL_BASED_OUTPATIENT_CLINIC_OR_DEPARTMENT_OTHER): Admitting: Orthopaedic Surgery

## 2024-03-20 ENCOUNTER — Ambulatory Visit: Payer: PPO | Admitting: Internal Medicine

## 2024-03-26 ENCOUNTER — Ambulatory Visit (HOSPITAL_BASED_OUTPATIENT_CLINIC_OR_DEPARTMENT_OTHER)

## 2024-03-26 ENCOUNTER — Ambulatory Visit (HOSPITAL_BASED_OUTPATIENT_CLINIC_OR_DEPARTMENT_OTHER): Admitting: Orthopaedic Surgery

## 2024-03-26 DIAGNOSIS — M12812 Other specific arthropathies, not elsewhere classified, left shoulder: Secondary | ICD-10-CM
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
## 2024-03-31 ENCOUNTER — Encounter: Payer: Self-pay | Admitting: Internal Medicine

## 2024-03-31 ENCOUNTER — Ambulatory Visit (INDEPENDENT_AMBULATORY_CARE_PROVIDER_SITE_OTHER): Admitting: Internal Medicine

## 2024-03-31 VITALS — BP 104/66 | HR 78 | Ht 66.0 in | Wt 228.0 lb

## 2024-03-31 DIAGNOSIS — E1165 Type 2 diabetes mellitus with hyperglycemia: Secondary | ICD-10-CM

## 2024-03-31 DIAGNOSIS — E1149 Type 2 diabetes mellitus with other diabetic neurological complication: Secondary | ICD-10-CM | POA: Diagnosis not present

## 2024-03-31 DIAGNOSIS — N1831 Chronic kidney disease, stage 3a: Secondary | ICD-10-CM | POA: Diagnosis not present

## 2024-03-31 DIAGNOSIS — E1122 Type 2 diabetes mellitus with diabetic chronic kidney disease: Secondary | ICD-10-CM

## 2024-03-31 DIAGNOSIS — Z794 Long term (current) use of insulin: Secondary | ICD-10-CM | POA: Diagnosis not present

## 2024-03-31 DIAGNOSIS — E785 Hyperlipidemia, unspecified: Secondary | ICD-10-CM | POA: Insufficient documentation

## 2024-03-31 LAB — POCT GLYCOSYLATED HEMOGLOBIN (HGB A1C): Hemoglobin A1C: 6.7 % — AB (ref 4.0–5.6)

## 2024-03-31 MED ORDER — ATORVASTATIN CALCIUM 20 MG PO TABS: 20.0000 mg | ORAL_TABLET | Freq: Every day | ORAL | 3 refills | Status: DC

## 2024-03-31 NOTE — Patient Instructions (Addendum)
-   Continue Farxiga  10 mg daily - Decrease Lantus  16 units daily  - Take Novolog  8 units with each meal  - Novolog  correctional insulin : ADD extra units on insulin  to your meal-time Novolog  dose if your blood sugars are higher than 160. Use the scale below to help guide you Three times a day ( breakfast, lunch and Supper)  Blood sugar before meal Number of units to inject  Less than 160 0 unit  161 -  190 1 units  191 -  220 2 units  221 -  250 3 units  251 -  280 4 units  281 -  310 5 units  311 -  340 6 units  341 -  370 7 units   Take Atorvastatin  20 mg daily   HOW TO TREAT LOW BLOOD SUGARS (Blood sugar LESS THAN 70 MG/DL) Please follow the RULE OF 15 for the treatment of hypoglycemia treatment (when your (blood sugars are less than 70 mg/dL)   STEP 1: Take 15 grams of carbohydrates when your blood sugar is low, which includes:  3-4 GLUCOSE TABS  OR 3-4 OZ OF JUICE OR REGULAR SODA OR ONE TUBE OF GLUCOSE GEL    STEP 2: RECHECK blood sugar in 15 MINUTES STEP 3: If your blood sugar is still low at the 15 minute recheck --> then, go back to STEP 1 and treat AGAIN with another 15 grams of carbohydrates.
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
## 2024-04-01 ENCOUNTER — Encounter: Payer: Self-pay | Admitting: Internal Medicine

## 2024-04-03 ENCOUNTER — Ambulatory Visit (HOSPITAL_COMMUNITY)
Admission: RE | Admit: 2024-04-03 | Discharge: 2024-04-03 | Disposition: A | Discharge status: Home / Self Care | Source: Ambulatory Visit | Attending: Family Medicine | Admitting: Family Medicine

## 2024-04-03 ENCOUNTER — Encounter (HOSPITAL_COMMUNITY): Payer: Self-pay

## 2024-04-03 ENCOUNTER — Other Ambulatory Visit (HOSPITAL_BASED_OUTPATIENT_CLINIC_OR_DEPARTMENT_OTHER): Payer: Self-pay | Admitting: Nurse Practitioner

## 2024-04-03 ENCOUNTER — Ambulatory Visit (INDEPENDENT_AMBULATORY_CARE_PROVIDER_SITE_OTHER)

## 2024-04-03 VITALS — BP 116/73 | HR 62 | Temp 97.8°F | Resp 16

## 2024-04-03 DIAGNOSIS — E559 Vitamin D deficiency, unspecified: Secondary | ICD-10-CM

## 2024-04-03 DIAGNOSIS — M25551 Pain in right hip: Secondary | ICD-10-CM | POA: Diagnosis not present

## 2024-04-03 MED ORDER — TIZANIDINE HCL 4 MG PO TABS: 4.0000 mg | ORAL_TABLET | Freq: Four times a day (QID) | ORAL | 0 refills | Status: DC | PRN

## 2024-04-03 MED ORDER — HYDROCODONE-ACETAMINOPHEN 5-325 MG PO TABS
1.0000 | ORAL_TABLET | Freq: Four times a day (QID) | ORAL | 0 refills | Status: AC | PRN
Start: 2024-04-03 — End: ?

## 2024-04-03 NOTE — Discharge Instructions (Signed)
 Be aware, you have been prescribed pain medications that may cause drowsiness. While taking this medication, do not take any other medications containing acetaminophen (Tylenol). Do not combine with alcohol or recreational drugs. Please do not drive, operate heavy machinery, or take part in activities that require making important decisions while on this medication as your judgement may be clouded.
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
## 2024-04-03 NOTE — ED Triage Notes (Signed)
 Patient here today with c/o right hip pain X 4 days. No known injury. Patient had a hip replacement 10/2014.

## 2024-04-04 NOTE — Telephone Encounter (Signed)
 Patient sees [facility] for PCP

## 2024-04-08 ENCOUNTER — Ambulatory Visit (HOSPITAL_COMMUNITY): Payer: Self-pay

## 2024-04-22 ENCOUNTER — Other Ambulatory Visit: Payer: PPO

## 2024-04-24 ENCOUNTER — Other Ambulatory Visit: Payer: Self-pay | Admitting: Internal Medicine

## 2024-05-03 ENCOUNTER — Other Ambulatory Visit: Payer: Self-pay | Admitting: Internal Medicine

## 2024-05-03 DIAGNOSIS — E1149 Type 2 diabetes mellitus with other diabetic neurological complication: Secondary | ICD-10-CM

## 2024-05-07 ENCOUNTER — Ambulatory Visit (HOSPITAL_BASED_OUTPATIENT_CLINIC_OR_DEPARTMENT_OTHER): Admitting: Orthopaedic Surgery

## 2024-05-13 ENCOUNTER — Other Ambulatory Visit: Payer: PPO

## 2024-05-19 ENCOUNTER — Other Ambulatory Visit: Payer: Self-pay | Admitting: Internal Medicine

## 2024-05-19 ENCOUNTER — Other Ambulatory Visit: Payer: Self-pay | Admitting: Nurse Practitioner

## 2024-05-19 DIAGNOSIS — R519 Headache, unspecified: Secondary | ICD-10-CM

## 2024-05-19 DIAGNOSIS — E559 Vitamin D deficiency, unspecified: Secondary | ICD-10-CM

## 2024-05-19 DIAGNOSIS — M797 Fibromyalgia: Secondary | ICD-10-CM

## 2024-05-19 DIAGNOSIS — E1159 Type 2 diabetes mellitus with other circulatory complications: Secondary | ICD-10-CM

## 2024-06-01 ENCOUNTER — Other Ambulatory Visit (HOSPITAL_BASED_OUTPATIENT_CLINIC_OR_DEPARTMENT_OTHER): Payer: Self-pay | Admitting: Nurse Practitioner

## 2024-06-01 DIAGNOSIS — E1149 Type 2 diabetes mellitus with other diabetic neurological complication: Secondary | ICD-10-CM

## 2024-06-18 ENCOUNTER — Telehealth (HOSPITAL_BASED_OUTPATIENT_CLINIC_OR_DEPARTMENT_OTHER): Payer: Self-pay | Admitting: Orthopaedic Surgery

## 2024-06-18 ENCOUNTER — Ambulatory Visit (HOSPITAL_BASED_OUTPATIENT_CLINIC_OR_DEPARTMENT_OTHER): Admitting: Orthopaedic Surgery

## 2024-06-18 ENCOUNTER — Other Ambulatory Visit (HOSPITAL_BASED_OUTPATIENT_CLINIC_OR_DEPARTMENT_OTHER): Payer: Self-pay

## 2024-06-18 DIAGNOSIS — M542 Cervicalgia: Secondary | ICD-10-CM

## 2024-06-18 MED ORDER — TIZANIDINE HCL 4 MG PO TABS
4.0000 mg | ORAL_TABLET | Freq: Four times a day (QID) | ORAL | 3 refills | Status: AC | PRN
  Filled 2024-06-18: qty 30, 8d supply, fill #0
  Filled 2024-08-05: qty 30, 8d supply, fill #1
  Filled 2024-11-20: qty 30, 8d supply, fill #2

## 2024-06-18 NOTE — Telephone Encounter (Signed)
 Patient wants know if tizanidine  was called to pharmacy. Or the med that he was supposed to call in today
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
## 2024-06-19 ENCOUNTER — Other Ambulatory Visit (HOSPITAL_BASED_OUTPATIENT_CLINIC_OR_DEPARTMENT_OTHER): Payer: Self-pay | Admitting: Orthopaedic Surgery

## 2024-06-19 MED ORDER — TIZANIDINE HCL 4 MG PO TABS: 4.0000 mg | ORAL_TABLET | Freq: Four times a day (QID) | ORAL | 2 refills | Status: DC | PRN

## 2024-06-20 ENCOUNTER — Other Ambulatory Visit (HOSPITAL_BASED_OUTPATIENT_CLINIC_OR_DEPARTMENT_OTHER): Payer: Self-pay | Admitting: Nurse Practitioner

## 2024-06-20 ENCOUNTER — Other Ambulatory Visit (HOSPITAL_BASED_OUTPATIENT_CLINIC_OR_DEPARTMENT_OTHER): Payer: Self-pay | Admitting: Orthopaedic Surgery

## 2024-06-20 DIAGNOSIS — E1149 Type 2 diabetes mellitus with other diabetic neurological complication: Secondary | ICD-10-CM

## 2024-06-20 MED ORDER — TIZANIDINE HCL 4 MG PO TABS: 4.0000 mg | ORAL_TABLET | Freq: Four times a day (QID) | ORAL | 2 refills | Status: AC | PRN

## 2024-06-20 NOTE — Telephone Encounter (Signed)
 Sees someone at [facility] as PCP

## 2024-06-21 ENCOUNTER — Other Ambulatory Visit (HOSPITAL_BASED_OUTPATIENT_CLINIC_OR_DEPARTMENT_OTHER): Payer: Self-pay

## 2024-06-24 ENCOUNTER — Encounter: Payer: Self-pay | Admitting: Internal Medicine

## 2024-06-25 ENCOUNTER — Other Ambulatory Visit: Payer: Self-pay

## 2024-06-25 MED ORDER — FREESTYLE LIBRE 3 PLUS SENSOR MISC: 3 refills | Status: AC

## 2024-06-25 NOTE — Telephone Encounter (Signed)
 Prescription sent per patient request

## 2024-07-02 ENCOUNTER — Ambulatory Visit: Admitting: Physical Medicine and Rehabilitation

## 2024-07-03 ENCOUNTER — Encounter: Payer: Self-pay | Admitting: Internal Medicine

## 2024-07-03 ENCOUNTER — Telehealth: Payer: Self-pay | Admitting: Internal Medicine

## 2024-07-03 NOTE — Telephone Encounter (Signed)
 Please contact the patient and schedule her for an appointment to see me sooner.  I have not seen her since May and somehow her next appointment is not until November?    Thanks

## 2024-07-03 NOTE — Telephone Encounter (Signed)
 LMx1 for patient to call office to schedule sooner appointment than 09/26/2024.

## 2024-07-07 NOTE — Telephone Encounter (Signed)
 Patient is scheduled for 07/22/2024 with Dr. Sam.

## 2024-07-16 ENCOUNTER — Other Ambulatory Visit: Payer: Self-pay | Admitting: Nurse Practitioner

## 2024-07-16 DIAGNOSIS — M797 Fibromyalgia: Secondary | ICD-10-CM

## 2024-07-16 DIAGNOSIS — E559 Vitamin D deficiency, unspecified: Secondary | ICD-10-CM

## 2024-07-21 ENCOUNTER — Encounter: Payer: Self-pay | Admitting: Internal Medicine

## 2024-07-22 ENCOUNTER — Ambulatory Visit: Admitting: Internal Medicine
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
## 2024-07-24 ENCOUNTER — Other Ambulatory Visit (HOSPITAL_COMMUNITY): Payer: Self-pay | Admitting: Emergency Medicine

## 2024-07-24 DIAGNOSIS — M25572 Pain in left ankle and joints of left foot: Secondary | ICD-10-CM

## 2024-07-30 ENCOUNTER — Other Ambulatory Visit (HOSPITAL_BASED_OUTPATIENT_CLINIC_OR_DEPARTMENT_OTHER): Payer: Self-pay | Admitting: Orthopaedic Surgery

## 2024-07-30 ENCOUNTER — Encounter (HOSPITAL_BASED_OUTPATIENT_CLINIC_OR_DEPARTMENT_OTHER): Payer: Self-pay | Admitting: Orthopaedic Surgery

## 2024-07-30 DIAGNOSIS — M12812 Other specific arthropathies, not elsewhere classified, left shoulder: Secondary | ICD-10-CM

## 2024-07-30 DIAGNOSIS — M7581 Other shoulder lesions, right shoulder: Secondary | ICD-10-CM

## 2024-07-31 ENCOUNTER — Encounter (HOSPITAL_COMMUNITY): Payer: Self-pay

## 2024-07-31 ENCOUNTER — Ambulatory Visit (HOSPITAL_BASED_OUTPATIENT_CLINIC_OR_DEPARTMENT_OTHER): Attending: Orthopaedic Surgery | Admitting: Physical Therapy

## 2024-07-31 ENCOUNTER — Ambulatory Visit (HOSPITAL_COMMUNITY): Admission: RE | Admit: 2024-07-31 | Source: Ambulatory Visit

## 2024-08-05 ENCOUNTER — Other Ambulatory Visit (HOSPITAL_BASED_OUTPATIENT_CLINIC_OR_DEPARTMENT_OTHER): Payer: Self-pay

## 2024-08-11 ENCOUNTER — Ambulatory Visit: Admitting: Internal Medicine

## 2024-08-11 ENCOUNTER — Encounter: Payer: Self-pay | Admitting: Internal Medicine

## 2024-08-11 VITALS — BP 110/70 | HR 80 | Ht 66.0 in | Wt 227.6 lb

## 2024-08-11 DIAGNOSIS — E785 Hyperlipidemia, unspecified: Secondary | ICD-10-CM | POA: Diagnosis not present

## 2024-08-11 DIAGNOSIS — N1831 Chronic kidney disease, stage 3a: Secondary | ICD-10-CM | POA: Diagnosis not present

## 2024-08-11 DIAGNOSIS — E1149 Type 2 diabetes mellitus with other diabetic neurological complication: Secondary | ICD-10-CM | POA: Diagnosis not present

## 2024-08-11 DIAGNOSIS — E1122 Type 2 diabetes mellitus with diabetic chronic kidney disease: Secondary | ICD-10-CM | POA: Diagnosis not present

## 2024-08-11 DIAGNOSIS — Z794 Long term (current) use of insulin: Secondary | ICD-10-CM

## 2024-08-11 LAB — POCT GLYCOSYLATED HEMOGLOBIN (HGB A1C): Hemoglobin A1C: 6.8 % — AB (ref 4.0–5.6)

## 2024-08-11 LAB — MICROALBUMIN / CREATININE URINE RATIO
Creatinine, Urine: 74 mg/dL (ref 20–275)
Microalb Creat Ratio: 32 mg/g{creat} — ABNORMAL HIGH (ref <30)
Microalb, Ur: 2.4 mg/dL

## 2024-08-11 LAB — BASIC METABOLIC PANEL WITH GFR
BUN/Creatinine Ratio: 19 (calc) (ref 6–22)
BUN: 31 mg/dL — ABNORMAL HIGH (ref 7–25)
CO2: 31 mmol/L (ref 20–32)
Calcium: 9.6 mg/dL (ref 8.6–10.4)
Chloride: 98 mmol/L (ref 98–110)
Creat: 1.59 mg/dL — ABNORMAL HIGH (ref 0.50–1.05)
Glucose, Bld: 167 mg/dL — ABNORMAL HIGH (ref 65–139)
Potassium: 4.3 mmol/L (ref 3.5–5.3)
Sodium: 135 mmol/L (ref 135–146)
eGFR: 35 mL/min/1.73m2 — ABNORMAL LOW (ref >=60)

## 2024-08-11 LAB — LIPID PANEL
Cholesterol: 210 mg/dL — ABNORMAL HIGH (ref <200)
HDL: 63 mg/dL (ref >=50)
LDL Cholesterol (Calc): 129 mg/dL — ABNORMAL HIGH
Non-HDL Cholesterol (Calc): 147 mg/dL — ABNORMAL HIGH (ref <130)
Total CHOL/HDL Ratio: 3.3 (calc) (ref <5.0)
Triglycerides: 84 mg/dL (ref <150)

## 2024-08-11 NOTE — Patient Instructions (Addendum)
-   Continue Farxiga  10 mg daily - Decrease Lantus  8 units daily  - Novolog  correctional insulin : Use the scale below to help guide you Three times a day ( breakfast, lunch and Supper)  Blood sugar before meal Number of units to inject  Less than 160 0 unit  161 -  190 1 units  191 -  220 2 units  221 -  250 3 units  251 -  280 4 units  281 -  310 5 units  311 -  340 6 units  341 -  370 7 units    HOW TO TREAT LOW BLOOD SUGARS (Blood sugar LESS THAN 70 MG/DL) Please follow the RULE OF 15 for the treatment of hypoglycemia treatment (when your (blood sugars are less than 70 mg/dL)   STEP 1: Take 15 grams of carbohydrates when your blood sugar is low, which includes:  3-4 GLUCOSE TABS  OR 3-4 OZ OF JUICE OR REGULAR SODA OR ONE TUBE OF GLUCOSE GEL    STEP 2: RECHECK blood sugar in 15 MINUTES STEP 3: If your blood sugar is still low at the 15 minute recheck --> then, go back to STEP 1 and treat AGAIN with another 15 grams of carbohydrates.
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
## 2024-08-13 ENCOUNTER — Ambulatory Visit: Payer: Self-pay | Admitting: Internal Medicine

## 2024-08-13 MED ORDER — ROSUVASTATIN CALCIUM 20 MG PO TABS: 20.0000 mg | ORAL_TABLET | Freq: Every day | ORAL | 3 refills | Status: AC

## 2024-08-16 ENCOUNTER — Other Ambulatory Visit: Payer: Self-pay | Admitting: Internal Medicine

## 2024-08-27 ENCOUNTER — Other Ambulatory Visit: Payer: Self-pay | Admitting: Nurse Practitioner

## 2024-08-27 DIAGNOSIS — I152 Hypertension secondary to endocrine disorders: Secondary | ICD-10-CM

## 2024-09-15 ENCOUNTER — Encounter: Payer: Self-pay | Admitting: Radiology

## 2024-09-26 ENCOUNTER — Ambulatory Visit: Admitting: Internal Medicine

## 2024-10-17 ENCOUNTER — Other Ambulatory Visit: Payer: Self-pay | Admitting: Internal Medicine

## 2024-10-17 DIAGNOSIS — E1165 Type 2 diabetes mellitus with hyperglycemia: Secondary | ICD-10-CM

## 2024-11-20 ENCOUNTER — Other Ambulatory Visit (HOSPITAL_BASED_OUTPATIENT_CLINIC_OR_DEPARTMENT_OTHER): Payer: Self-pay

## 2024-11-22 ENCOUNTER — Encounter (HOSPITAL_BASED_OUTPATIENT_CLINIC_OR_DEPARTMENT_OTHER): Payer: Self-pay

## 2024-11-22 ENCOUNTER — Other Ambulatory Visit: Payer: Self-pay

## 2024-11-22 ENCOUNTER — Emergency Department (HOSPITAL_BASED_OUTPATIENT_CLINIC_OR_DEPARTMENT_OTHER)
Admission: EM | Admit: 2024-11-22 | Discharge: 2024-11-22 | Disposition: A | Discharge status: Home / Self Care | Source: Ambulatory Visit | Attending: Emergency Medicine | Admitting: Emergency Medicine

## 2024-11-22 ENCOUNTER — Emergency Department (HOSPITAL_BASED_OUTPATIENT_CLINIC_OR_DEPARTMENT_OTHER): Admitting: Radiology

## 2024-11-22 DIAGNOSIS — R000 Tachycardia, unspecified: Secondary | ICD-10-CM | POA: Diagnosis not present

## 2024-11-22 DIAGNOSIS — R002 Palpitations: Secondary | ICD-10-CM | POA: Diagnosis present

## 2024-11-22 DIAGNOSIS — N189 Chronic kidney disease, unspecified: Secondary | ICD-10-CM | POA: Insufficient documentation

## 2024-11-22 DIAGNOSIS — R079 Chest pain, unspecified: Secondary | ICD-10-CM | POA: Insufficient documentation

## 2024-11-22 DIAGNOSIS — R5383 Other fatigue: Secondary | ICD-10-CM | POA: Insufficient documentation

## 2024-11-22 DIAGNOSIS — E1122 Type 2 diabetes mellitus with diabetic chronic kidney disease: Secondary | ICD-10-CM | POA: Insufficient documentation

## 2024-11-22 DIAGNOSIS — R0602 Shortness of breath: Secondary | ICD-10-CM | POA: Diagnosis not present

## 2024-11-22 DIAGNOSIS — Z794 Long term (current) use of insulin: Secondary | ICD-10-CM | POA: Insufficient documentation

## 2024-11-22 DIAGNOSIS — Z79899 Other long term (current) drug therapy: Secondary | ICD-10-CM | POA: Diagnosis not present

## 2024-11-22 DIAGNOSIS — I130 Hypertensive heart and chronic kidney disease with heart failure and stage 1 through stage 4 chronic kidney disease, or unspecified chronic kidney disease: Secondary | ICD-10-CM | POA: Insufficient documentation

## 2024-11-22 DIAGNOSIS — I509 Heart failure, unspecified: Secondary | ICD-10-CM | POA: Diagnosis not present

## 2024-11-22 LAB — BASIC METABOLIC PANEL WITH GFR
Anion gap: 11 (ref 5–15)
BUN: 28 mg/dL — ABNORMAL HIGH (ref 8–23)
CO2: 29 mmol/L (ref 22–32)
Calcium: 10.3 mg/dL (ref 8.9–10.3)
Chloride: 99 mmol/L (ref 98–111)
Creatinine, Ser: 1.32 mg/dL — ABNORMAL HIGH (ref 0.44–1.00)
GFR, Estimated: 43 mL/min — ABNORMAL LOW
Glucose, Bld: 115 mg/dL — ABNORMAL HIGH (ref 70–99)
Potassium: 4.1 mmol/L (ref 3.5–5.1)
Sodium: 139 mmol/L (ref 135–145)

## 2024-11-22 LAB — CBC
HCT: 37.6 % (ref 36.0–46.0)
Hemoglobin: 11.8 g/dL — ABNORMAL LOW (ref 12.0–15.0)
MCH: 28 pg (ref 26.0–34.0)
MCHC: 31.4 g/dL (ref 30.0–36.0)
MCV: 89.3 fL (ref 80.0–100.0)
Platelets: 306 K/uL (ref 150–400)
RBC: 4.21 MIL/uL (ref 3.87–5.11)
RDW: 13.5 % (ref 11.5–15.5)
WBC: 4.6 K/uL (ref 4.0–10.5)
nRBC: 0 % (ref 0.0–0.2)

## 2024-11-22 LAB — TSH: TSH: 1.61 u[IU]/mL (ref 0.350–4.500)

## 2024-11-22 LAB — TROPONIN T, HIGH SENSITIVITY
Troponin T High Sensitivity: 16 ng/L (ref 0–19)
Troponin T High Sensitivity: <15 ng/L (ref 0–19)

## 2024-11-22 LAB — MAGNESIUM: Magnesium: 2.1 mg/dL (ref 1.7–2.4)

## 2024-11-22 NOTE — ED Provider Notes (Signed)
 " [place] EMERGENCY DEPARTMENT AT [facility] Provider Note   CSN: [id number] Arrival date & time: 11/22/24  1356     Patient presents with: Chest Pain   Cynthia [name] is a 69 y.o. female.   Patient with long standing resistant hypertension, CKD (Crt=1.3), T2DM --presents to the emergency department today for evaluation of palpitations.  Patient reports that she was awake early this morning around 3 AM.  She got up to make her bed when she suddenly felt tachypalpitations.  She had associated shortness of breath.  No syncope.  She lay down in bed for about 30 minutes and felt better, got back up but shortly afterwards, the symptoms reoccurred.  She stayed up until her husband got home from work.  At that time she got up and moved around and felt generally better but feels very drained today.  She denies any additional symptoms or episodes after that time.  She went to urgent care and was recommended to come to the emergency department for evaluation.  No associated diaphoresis or vomiting.  She denies history of arrhythmia.  She has a history of heart failure which is well-controlled.  She takes spironolactone .  No other diuretics.  Follows with cardiology in the [facility] system.  She drinks 1 cup of coffee daily.       Prior to Admission medications  Medication Sig Start Date End Date Taking? Authorizing Provider  amLODipine  (NORVASC ) 10 MG tablet Take 1 tablet by mouth daily. 12/10/23 12/09/24  [provider]  APPLE CIDER VINEGAR PO Take 1 tablet by mouth daily.    [provider]  Continuous Glucose Sensor (FREESTYLE LIBRE 3 PLUS SENSOR) MISC Change sensor every 15 days. 06/25/24   Shamleffer, Ibtehal Jaralla, MD  Continuous Glucose Sensor (FREESTYLE LIBRE 3 PLUS SENSOR) MISC PLACE 1 SENSOR ON THE SKIN EVERY 15 DAYS. USE TO CHECK GLUCOSE CONTINUOUSLY 08/18/24   Shamleffer, Donell Cardinal, MD  FARXIGA  10 MG TABS tablet TAKE 1 TABLET BY MOUTH DAILY BEFORE  BREAKFAST. 10/17/24   Shamleffer, Ibtehal Jaralla, MD  GARLIC PO Take 1 capsule by mouth daily.    [provider]  glucose blood (ONETOUCH VERIO) test strip USE AS INSTRUCTED 3 TIMES A DAY 05/15/23   Early, Sara E, NP  Green Tea, Camellia sinensis, (GREEN TEA PO) Take 1 capsule by mouth daily.    [provider]  HYDROcodone -acetaminophen  (NORCO/VICODIN) 5-325 MG tablet Take 1 tablet by mouth every 6 (six) hours as needed for moderate pain (pain score 4-6) or severe pain (pain score 7-10). 04/03/24   Rolinda Rogue, MD  insulin  glargine (LANTUS  SOLOSTAR) 100 UNIT/ML Solostar Pen Inject 16 Units into the skin at bedtime. 05/20/24   Shamleffer, Ibtehal Jaralla, MD  Insulin  Pen Needle 32G X 4 MM MISC 1 Device by Does not apply route in the morning, at noon, in the evening, and at bedtime. 03/27/23   Shamleffer, Donell Cardinal, MD  LINZESS  72 MCG capsule Take 72 mcg by mouth daily.    [provider]  losartan -hydrochlorothiazide (HYZAAR) 50-12.5 MG tablet Take 1 tablet by mouth in the morning and at bedtime. 07/27/23 10/25/23  [provider]  metoprolol  (TOPROL -XL) 200 MG 24 hr tablet TAKE 1 TABLET BY MOUTH EVERY DAY 04/16/23   Early, Sara E, NP  metoprolol  tartrate (LOPRESSOR ) 100 MG tablet Take 100 mg by mouth daily. 07/27/23   [provider]  Microlet Lancets MISC Use for testing blood sugar every morning before meals and up to  3 additional times per day. For Microlet lancet device.  Dx: E11.65 05/23/22   Early, Sara E, NP  nortriptyline  (PAMELOR ) 25 MG capsule Take 25 mg by mouth at bedtime as needed.    [provider]  NOVOLOG  FLEXPEN 100 UNIT/ML FlexPen MAX DAILY 30 UNITS 05/05/24   Shamleffer, Donell Cardinal, MD  ramelteon  (ROZEREM ) 8 MG tablet Take 1 tablet (8 mg total) by mouth at bedtime. 07/05/23   Early, Sara E, NP  rizatriptan  (MAXALT ) 5 MG tablet Take 1 tablet (5 mg total) by mouth as needed for migraine. May repeat in 2 hours if needed 06/07/23    Early, Sara E, NP  rosuvastatin  (CRESTOR ) 20 MG tablet Take 1 tablet (20 mg total) by mouth daily. 08/13/24   Shamleffer, Ibtehal Jaralla, MD  spironolactone  (ALDACTONE ) 25 MG tablet Take 25 mg by mouth daily.    [provider]  tiZANidine  (ZANAFLEX ) 4 MG tablet Take 1 tablet (4 mg total) by mouth every 6 (six) hours as needed for muscle spasms. 06/18/24   Genelle Standing, MD  tiZANidine  (ZANAFLEX ) 4 MG tablet Take 1 tablet (4 mg total) by mouth every 6 (six) hours as needed for muscle spasms. 06/20/24   Genelle Standing, MD  traZODone (DESYREL) 150 MG tablet Take 150 mg by mouth at bedtime. 07/27/23   [provider]  Vitamin D , Ergocalciferol , (DRISDOL ) 1.25 MG (50000 UNIT) CAPS capsule TAKE 1 CAPSULE (50,000 UNITS TOTAL) BY MOUTH EVERY 7 (SEVEN) DAYS 04/16/23   Early, Camie BRAVO, NP    Allergies: Lisinopril , Other, Ambien [zolpidem tartrate], Clonidine  and derivatives, Cymbalta  [duloxetine  hcl], Lyrica [pregabalin], Percocet [oxycodone -acetaminophen ], and Prednisone     Review of Systems  Updated Vital Signs BP (!) 150/92 (BP Location: Right Arm)   Pulse 63   Temp 97.8 F (36.6 C)   Resp 16   SpO2 97%   Physical Exam Vitals and nursing note reviewed.  Constitutional:      Appearance: She is well-developed. She is not diaphoretic.  HENT:     Head: Normocephalic and atraumatic.     Mouth/Throat:     Mouth: Mucous membranes are not dry.  Eyes:     Conjunctiva/sclera: Conjunctivae normal.  Neck:     Vascular: Normal carotid pulses. No JVD.     Trachea: Trachea normal. No tracheal deviation.  Cardiovascular:     Rate and Rhythm: Normal rate and regular rhythm.     Pulses: No decreased pulses.          Radial pulses are 2+ on the right side and 2+ on the left side.     Heart sounds: Normal heart sounds, S1 normal and S2 normal. No murmur heard. Pulmonary:     Effort: Pulmonary effort is normal. No respiratory distress.     Breath sounds: No wheezing.  Chest:     Chest  wall: No tenderness.  Abdominal:     General: Bowel sounds are normal.     Palpations: Abdomen is soft.     Tenderness: There is no abdominal tenderness. There is no guarding or rebound.  Musculoskeletal:        General: Normal range of motion.     Cervical back: Normal range of motion and neck supple. No muscular tenderness.  Skin:    General: Skin is warm and dry.     Coloration: Skin is not pale.  Neurological:     Mental Status: She is alert.     (all labs ordered are listed, but only abnormal results  are displayed) Labs Reviewed  BASIC METABOLIC PANEL WITH GFR - Abnormal; Notable for the following components:      Result Value   Glucose, Bld 115 (*)    BUN 28 (*)    Creatinine, Ser 1.32 (*)    GFR, Estimated 43 (*)    All other components within normal limits  CBC - Abnormal; Notable for the following components:   Hemoglobin 11.8 (*)    All other components within normal limits  MAGNESIUM  TSH  TROPONIN T, HIGH SENSITIVITY  TROPONIN T, HIGH SENSITIVITY    EKG: EKG Interpretation Date/Time:  Saturday November 22 2024 14:05:49 EST Ventricular Rate:  66 PR Interval:  184 QRS Duration:  100 QT Interval:  404 QTC Calculation: 423 R Axis:   -18  Text Interpretation: Normal sinus rhythm Left ventricular hypertrophy with repolarization abnormality ( R in aVL , Cornell product ) Abnormal ECG When compared with ECG of 05-Jun-2023 17:27, Nonspecific T wave abnormality, worse in Lateral leads Confirmed by Yolande Charleston [phone]) on 11/22/2024 3:49:43 PM  Radiology: [facility] Chest 2 View Result Date: 11/22/2024 CLINICAL DATA:  Chest pain EXAM: CHEST - 2 VIEW COMPARISON:  06/05/2023 FINDINGS: Frontal and lateral views of the chest demonstrate an unremarkable cardiac silhouette. Subsegmental atelectasis or scarring is seen within the left upper lobe. No airspace disease, effusion, or pneumothorax. No acute bony abnormalities. IMPRESSION: 1. No acute intrathoracic process.  Electronically Signed   By: Ozell Daring M.D.   On: 11/22/2024 14:53   ED Course  Patient seen and examined. History obtained directly from patient. Work-up including labs, imaging, EKG ordered in triage, if performed, were reviewed.    Labs/EKG: Independently reviewed and interpreted.  This included: CBC with hemoglobin 11.8 otherwise unremarkable; BMP glucose 115, creatinine at baseline 1.32 with a BUN of 28; troponin 16.  I have added on magnesium, second troponin, TSH.  Imaging: Independently visualized and interpreted.  This included: X-ray agree negative, no edema.  Medications/Fluids: None ordered  Most recent vital signs reviewed and are as follows: BP (!) 150/92 (BP Location: Right Arm)   Pulse 63   Temp 97.8 F (36.6 C)   Resp 16   SpO2 97%   Initial impression: Paroxysmal tachypalpitation.  No prior history.  Unclear cause.  Awaiting remainder of workup.  Patient may need referral for cardiac monitoring.  5:07 PM Reassessment performed. Patient appears stable and comfortable.  Heart rate remains around 60.  Patient discussed earlier with Dr. Yolande who has seen patient.  Labs personally reviewed and interpreted including: Second troponin less than 15, magnesium normal at 2.1, TSH normal at 1.6.  Reviewed pertinent lab work and imaging with patient at bedside. Questions answered.   Most current vital signs reviewed and are as follows: BP (!) 150/92 (BP Location: Right Arm)   Pulse 63   Temp 97.8 F (36.6 C)   Resp 16   SpO2 97%   Plan: Discharge to home.   Prescriptions written for: None  Other home care instructions discussed: Minimize caffeine , monitor symptoms  ED return instructions discussed: Discussed need to return with recurrent palpitations, especially if they persist, or are associated with chest pain or shortness of breath, lightheadedness or passing out.  Follow-up instructions discussed: Patient encouraged to follow-up with their PCP or  cardiologist in the next 1 week.  Encouraged to call on Monday for a follow-up appointment.    Procedures   Medications Ordered in the ED - No data to display  Medical Decision Making Amount and/or Complexity of Data Reviewed Labs: ordered. Radiology: ordered.   Patient presents to the emergency department after having a couple of hours of palpitations last evening.  She did not pass out.  She did have some associated vague chest pain and shortness of breath.  The symptoms resolved and currently patient only complains of generalized fatigue and feeling worn out.  Her lab workup was very reassuring.  2 normal troponins.  Thyroid  and magnesium are normal.  No recurrent episodes while in the emergency department.  Patient is already established with cardiology.  She may benefit from outpatient cardiac monitor.  We discussed what she should do if symptoms return.  I encouraged her to be willing to call 911 if she has any severe symptoms, including chest pain.      Final diagnoses:  Palpitations    ED Discharge Orders     None          Desiderio Chew, [place] 11/22/24 1709  "

## 2024-11-22 NOTE — Discharge Instructions (Signed)
 Please read and follow all provided instructions.  Your diagnoses today include:  1. Palpitations     Tests performed today include: An EKG of your heart: Did not show any concerning changes over the past few years A chest x-ray: No signs of fluid buildup or heart failure Cardiac enzymes: a blood test for heart muscle damage, were both normal Blood counts and electrolytes: No concerning findings including normal magnesium Thyroid  stimulating hormone: Did not show signs of abnormal thyroid  level Vital signs. See below for your results today.   Medications prescribed:  None  Take any prescribed medications only as directed.  Follow-up instructions: Please call your primary doctor or cardiologist on Monday to schedule an outpatient follow-up.  If you have intermittent palpitations, they may want you to have a cardiac monitor to try to catch any abnormal rhythms.  Return instructions:  SEEK IMMEDIATE MEDICAL ATTENTION IF: You have severe chest pain, especially if the pain is crushing or pressure-like and spreads to the arms, back, neck, or jaw, or if you have sweating, nausea or vomiting, or trouble with breathing. THIS IS AN EMERGENCY. Do not wait to see if the pain will go away. Get medical help at once. Call 911. DO NOT drive yourself to the hospital.  Your chest pain gets worse and does not go away after a few minutes of rest.  You have an attack of chest pain lasting longer than what you usually experience.  You have significant dizziness, if you pass out, or have trouble walking.  You have chest pain not typical of your usual pain for which you originally saw your caregiver.  You have any other emergent concerns regarding your health.  Additional Information: Chest pain comes from many different causes. Your caregiver has diagnosed you as having chest pain that is not specific for one problem, but does not require admission.  You are at low risk for an acute heart condition or other  serious illness.   Your vital signs today were: BP (!) 150/92 (BP Location: Right Arm)   Pulse 63   Temp 97.8 F (36.6 C)   Resp 16   SpO2 97%  If your blood pressure (BP) was elevated above 135/85 this visit, please have this repeated by your doctor within one month. --------------

## 2024-11-22 NOTE — ED Triage Notes (Signed)
 She c/o two episodes of somewhat vague chest discomfort, plus I just don't feel like myself. She is awake, alert and in no distress. Sent here after having been seen by [facility].

## 2025-02-09 ENCOUNTER — Ambulatory Visit: Admitting: Internal Medicine
